# Patient Record
Sex: Male | Born: 1940 | Race: Black or African American | Hispanic: No | Smoking: Never smoker
Health system: Southern US, Community
[De-identification: ages and names within clinical notes are randomized; demographics above are authoritative.]

## PROBLEM LIST (undated history)

## (undated) DIAGNOSIS — F419 Anxiety disorder, unspecified: Secondary | ICD-10-CM

## (undated) DIAGNOSIS — K219 Gastro-esophageal reflux disease without esophagitis: Secondary | ICD-10-CM

## (undated) DIAGNOSIS — I10 Essential (primary) hypertension: Secondary | ICD-10-CM

## (undated) DIAGNOSIS — F79 Unspecified intellectual disabilities: Secondary | ICD-10-CM

## (undated) DIAGNOSIS — G4700 Insomnia, unspecified: Secondary | ICD-10-CM

## (undated) DIAGNOSIS — N189 Chronic kidney disease, unspecified: Secondary | ICD-10-CM

## (undated) HISTORY — DX: Gastro-esophageal reflux disease without esophagitis: K21.9

---

## 2014-10-28 ENCOUNTER — Encounter: Payer: Self-pay | Admitting: Medical Oncology

## 2014-10-28 ENCOUNTER — Emergency Department
Admission: EM | Admit: 2014-10-28 | Discharge: 2014-10-28 | Disposition: A | Discharge status: Home / Self Care | Payer: Medicare Other | Attending: Emergency Medicine | Admitting: Emergency Medicine

## 2014-10-28 DIAGNOSIS — Y846 Urinary catheterization as the cause of abnormal reaction of the patient, or of later complication, without mention of misadventure at the time of the procedure: Secondary | ICD-10-CM | POA: Insufficient documentation

## 2014-10-28 DIAGNOSIS — I10 Essential (primary) hypertension: Secondary | ICD-10-CM | POA: Diagnosis not present

## 2014-10-28 DIAGNOSIS — R339 Retention of urine, unspecified: Secondary | ICD-10-CM

## 2014-10-28 DIAGNOSIS — T839XXA Unspecified complication of genitourinary prosthetic device, implant and graft, initial encounter: Secondary | ICD-10-CM

## 2014-10-28 DIAGNOSIS — T83098A Other mechanical complication of other indwelling urethral catheter, initial encounter: Secondary | ICD-10-CM | POA: Diagnosis not present

## 2014-10-28 HISTORY — DX: Insomnia, unspecified: G47.00

## 2014-10-28 HISTORY — DX: Anxiety disorder, unspecified: F41.9

## 2014-10-28 HISTORY — DX: Essential (primary) hypertension: I10

## 2014-10-28 LAB — URINALYSIS COMPLETE WITH MICROSCOPIC (ARMC ONLY)
Bilirubin Urine: NEGATIVE
Glucose, UA: NEGATIVE mg/dL
Ketones, ur: NEGATIVE mg/dL
Leukocytes, UA: NEGATIVE
Nitrite: NEGATIVE
Protein, ur: NEGATIVE mg/dL
Specific Gravity, Urine: 1.013 (ref 1.005–1.030)
Squamous Epithelial / LPF: NONE SEEN
pH: 8 (ref 5.0–8.0)

## 2014-10-28 MED ORDER — LIDOCAINE HCL 2 % EX GEL
CUTANEOUS | Status: AC
  Filled 2014-10-28: qty 10

## 2014-10-28 MED ORDER — CEPHALEXIN 500 MG PO CAPS
500.0000 mg | ORAL_CAPSULE | Freq: Once | ORAL | Status: AC
  Administered 2014-10-28: 500 mg via ORAL

## 2014-10-28 MED ORDER — CEPHALEXIN 500 MG PO CAPS: 500.0000 mg | ORAL_CAPSULE | Freq: Three times a day (TID) | ORAL | Status: AC

## 2014-10-28 MED ORDER — CEPHALEXIN 500 MG PO CAPS
ORAL_CAPSULE | ORAL | Status: AC
  Filled 2014-10-28: qty 1

## 2014-10-28 NOTE — ED Provider Notes (Signed)
[facility] Emergency Department Provider Note  ____________________________________________  Time seen: Approximately 9:02 PM  I have reviewed the triage vital signs and the nursing notes.   HISTORY  Chief Complaint Urinary Retention    HPI Aaron Stokes is a 74 y.o. male patient reports being unable to urinate since last night prior to that he had some burning with urination. He has no nausea vomiting fever chills or any other complaints except for some lower abdominal pressure which resolves after the Foley is placed and began draining. Patient has not had this problem before.   Past Medical History  Diagnosis Date  . Hypertension   . Anxiety   . Insomnia     There are no active problems to display for this patient.   History reviewed. No pertinent past surgical history.  Current Outpatient Rx  Name  Route  Sig  Dispense  Refill  . cephALEXin (KEFLEX) 500 MG capsule   Oral   Take 1 capsule (500 mg total) by mouth 3 (three) times daily.   30 capsule   0     Allergies Review of patient's allergies indicates no known allergies.  No family history on file.  Social History History  Substance Use Topics  . Smoking status: Never Smoker   . Smokeless tobacco: Not on file  . Alcohol Use: No    Review of Systems Constitutional: No fever/chills Eyes: No visual changes. ENT: No sore throat. Cardiovascular: Denies chest pain. Respiratory: Denies shortness of breath. Musculoskeletal: Negative for back pain. Skin: Negative for rash. Neurological: Negative for headaches, focal weakness or numbness.  10-point ROS otherwise negative.  ____________________________________________   PHYSICAL EXAM:  VITAL SIGNS: ED Triage Vitals  Enc Vitals Group     BP 10/28/14 1758 153/94 mmHg     Pulse Rate 10/28/14 1758 115     Resp 10/28/14 1758 18     Temp 10/28/14 1758 97.2 F (36.2 C)     Temp Source 10/28/14 1758 Oral     SpO2 10/28/14 1758  99 %     Weight 10/28/14 1758 242 lb (109.77 kg)     Height 10/28/14 1758 5\' 8"  (1.727 m)     Head Cir --      Peak Flow --      Pain Score 10/28/14 1759 5     Pain Loc --      Pain Edu? --      Excl. in [place]? --     Constitutional: Alert and oriented. Well appearing and in no acute distress. Eyes: Conjunctivae are normal. PERRL. EOMI. Head: Atraumatic. Nose: No congestion/rhinnorhea. Mouth/Throat: Mucous membranes are moist.  Oropharynx non-erythematous..   Cardiovascular: Normal rate, regular rhythm. Grossly normal heart sounds.  Good peripheral circulation. Respiratory: Normal respiratory effort.  No retractions. Lungs CTAB. Gastrointestinal: Soft and nontender. No distention. No abdominal bruits. No CVA tenderness. Genitourinary: Foley inserted by nurse after 2 other has others had tried. Exam was then done by me. Prostate is soft and nontender. Was Hemoccult-negative Musculoskeletal: No lower extremity tenderness nor edema.  No joint effusions. Neurologic:  Normal speech and language. No gross focal neurologic deficits are appreciated. Speech is normal. No gait instability. Skin:  Skin is warm, dry and intact. No rash noted. Psychiatric: Mood and affect are normal. Speech and behavior are normal.  ____________________________________________   LABS (all labs ordered are listed, but only abnormal results are displayed)  Labs Reviewed  URINALYSIS COMPLETEWITH MICROSCOPIC ([place]) - Abnormal; Notable for the  following:    Color, Urine YELLOW (*)    APPearance CLOUDY (*)    Hgb urine dipstick 2+ (*)    Bacteria, UA RARE (*)    All other components within normal limits   ____________________________________________  EKG  ____________________________________________  RADIOLOGY   ____________________________________________   PROCEDURES   ____________________________________________   INITIAL IMPRESSION / ASSESSMENT AND PLAN / ED COURSE  Pertinent labs &  imaging results that were available during my care of the patient were reviewed by me and considered in my medical decision making (see chart for details). Since the patient had dysuria and the nurse reported the urine also smelled bad we will give him Keflex 500 mg 3 times a day to treat presumed UTI.   ____________________________________________   FINAL CLINICAL IMPRESSION(S) / ED DIAGNOSES  Final diagnoses:  Urinary retention  Foley catheter problem, initial encounter      Nena Polio, MD 10/28/14 2113

## 2014-10-28 NOTE — Discharge Instructions (Signed)
Acute Urinary Retention Acute urinary retention is when you are unable to pee (urinate). Acute urinary retention is common in older men. Prostates can get bigger, which blocks the flow of pee.  HOME CARE  Drink enough fluids to keep your pee clear or pale yellow.  If you are sent home with a tube that drains the bladder (catheter), there will be a drainage bag attached to it. There are two types of bags. One is big that you can wear at night without having to empty it. One is smaller and needs to be emptied more often.  Keep the drainage bag empty.  Keep the drainage bag lower than your catheter.  Only take medicine as told by your doctor. GET HELP IF:  You have a low-grade fever.  You have spasms or you are leaking pee when you have spasms. GET HELP RIGHT AWAY IF:   You have chills or a fever.  Your catheter stops draining pee.  Your catheter falls out.  You have increased bleeding that does not stop after you have rested and increased the amount of fluids you had been drinking. MAKE SURE YOU:   Understand these instructions.  Will watch your condition.  Will get help right away if you are not doing well or get worse. Document Released: 09/23/2007 Document Revised: 01/25/2013 Document Reviewed: 09/15/2012 [employer] Patient Information 2015 [place], [place]. This information is not intended to replace advice given to you by your health care provider. Make sure you discuss any questions you have with your health care provider.   Please call the urologist to set up a follow up appointment later this week. Wear the leg bag during the day. Return for any fever, vomiting or bleeding. Or feeling sicker.

## 2014-10-28 NOTE — ED Notes (Signed)
Pt to ER via ems with reports of lower abd pressure and unable to void since last night. When pt urinates he reports burning.

## 2014-11-23 ENCOUNTER — Ambulatory Visit: Payer: Self-pay | Admitting: Urology

## 2014-11-26 ENCOUNTER — Encounter: Payer: Self-pay | Admitting: Urology

## 2014-11-26 ENCOUNTER — Ambulatory Visit (INDEPENDENT_AMBULATORY_CARE_PROVIDER_SITE_OTHER): Payer: Medicare Other | Admitting: Urology

## 2014-11-26 VITALS — BP 125/76 | HR 111 | Ht 68.0 in | Wt 221.8 lb

## 2014-11-26 DIAGNOSIS — G4700 Insomnia, unspecified: Secondary | ICD-10-CM | POA: Insufficient documentation

## 2014-11-26 DIAGNOSIS — F419 Anxiety disorder, unspecified: Secondary | ICD-10-CM | POA: Insufficient documentation

## 2014-11-26 DIAGNOSIS — I10 Essential (primary) hypertension: Secondary | ICD-10-CM | POA: Insufficient documentation

## 2014-11-26 DIAGNOSIS — N471 Phimosis: Secondary | ICD-10-CM

## 2014-11-26 DIAGNOSIS — R338 Other retention of urine: Secondary | ICD-10-CM | POA: Diagnosis not present

## 2014-11-26 LAB — BLADDER SCAN AMB NON-IMAGING: Scan Result: 143

## 2014-11-26 NOTE — Progress Notes (Signed)
11/26/2014 8:18 AM   Aaron Stokes Sep 19, 1940 [id number]  Referring provider: Ricke Hey, MD Aaron Stokes, Aaron Stokes [id number]  Chief Complaint  Patient presents with  . Urinary Retention    pt was seen [facility] for urinary retention x 10/28/14    HPI: 74 year old male who presents today for follow-up after episode of urinary retention seen in the ER on 10/28/2014.  He reports that on the evening prior to his presentation to the ER, he developed difficulty urinating and burning with urination.  He was diagnosed with a urinary tract infection and treated with Keflex. His UA was mildy suspicious for infection, RBC TNTC, 0-5 WBC with WBC clumps.  No urine culture was sent.  A Foley was placed which remains in place today.    No history of UTI or urinary retention.    Prior to this episode, he denies any voiding symptoms including hematuria, dysuria, urinary frequency or urgency.  He did get up approximately once per night to urinate.  He takes no BPH meds and has no previous diagnosis of BPH.  He is not circumcised and sometimes has difficulty pulling back is foreskin.    No family history of prostate cancer.       PMH: Past Medical History  Diagnosis Date  . Hypertension   . Anxiety   . Insomnia   . Acid reflux     Surgical History: Past Surgical History  Procedure Laterality Date  . No past surgeries      Home Medications:    Medication List       This list is accurate as of: 11/26/14 11:59 PM.  Always use your most recent med list.               amLODipine 10 MG tablet  Commonly known as:  NORVASC     atenolol 50 MG tablet  Commonly known as:  TENORMIN  Take 50 mg by mouth daily.     flurazepam 30 MG capsule  Commonly known as:  DALMANE  take 1 capsule by mouth at bedtime if needed     perphenazine-amitriptyline 4-25 MG Tabs  Commonly known as:  ETRAFON/TRIAVIL        Allergies: No Known Allergies  Family History: Family History    Problem Relation Age of Onset  . Prostate cancer Neg Hx   . Bladder Cancer Father   . Bladder Cancer Paternal Grandmother     Social History:  reports that he has never smoked. He does not have any smokeless tobacco history on file. He reports that he does not drink alcohol or use illicit drugs.  ROS: UROLOGY Frequent Urination?: Yes Hard to postpone urination?: No Burning/pain with urination?: No Get up at night to urinate?: No Leakage of urine?: No Urine stream starts and stops?: No Trouble starting stream?: Yes Do you have to strain to urinate?: No Blood in urine?: No Urinary tract infection?: No Sexually transmitted disease?: No Injury to kidneys or bladder?: No Painful intercourse?: No Weak stream?: No Erection problems?: No Penile pain?: No  Gastrointestinal Nausea?: No Vomiting?: No Indigestion/heartburn?: No Diarrhea?: No Constipation?: No  Constitutional Fever: No Night sweats?: No Weight loss?: No Fatigue?: No  Skin Skin rash/lesions?: No Itching?: No  Eyes Blurred vision?: No Double vision?: No  Ears/Nose/Throat Sore throat?: No Sinus problems?: No  Hematologic/Lymphatic Swollen glands?: No Easy bruising?: No  Cardiovascular Leg swelling?: No Chest pain?: No  Respiratory Cough?: No Shortness of breath?: No  Endocrine Excessive  thirst?: No  Musculoskeletal Back pain?: No Joint pain?: No  Neurological Headaches?: No Dizziness?: No  Psychologic Depression?: No Anxiety?: No  Physical Exam: BP 125/76 mmHg  Pulse 111  Ht 5\' 8"  (1.727 m)  Wt 221 lb 12.8 oz (100.608 kg)  BMI 33.73 kg/m2  Constitutional:  Alert and oriented, No acute distress. HEENT: [place] AT, moist mucus membranes.  Trachea midline, no masses. Cardiovascular: No clubbing, cyanosis, or edema. Respiratory: Normal respiratory effort, no increased work of breathing. GI: Abdomen is soft, nontender, nondistended, no abdominal masses GU: No CVA tenderness.  Uncircumcised phallus with difficult to retract foreskin, Foley catheter in place, somewhat dirty appearing.  Rectal exam today performed, limited due to increased sphincter tone and patient compliance, able to palpate apex of prostate which was relatively normal. Skin: No rashes, bruises or suspicious lesions.  Scrotum unremarkable, and no masses, bilateral descended testicles. Lymph: No cervical or inguinal adenopathy. Neurologic: Grossly intact, no focal deficits, moving all 4 extremities. Psychiatric: Normal mood and affect.  Labs: Component     Latest Ref Rng 10/28/2014  Color, Urine     YELLOW YELLOW (A)  Appearance     CLEAR CLOUDY (A)  Glucose     NEGATIVE mg/dL NEGATIVE  Bilirubin Urine     NEGATIVE NEGATIVE  Ketones, ur     NEGATIVE mg/dL NEGATIVE  Specific Gravity, Urine     1.005 - 1.030 1.013  Hgb urine dipstick     NEGATIVE 2+ (A)  pH     5.0 - 8.0 8.0  Protein     NEGATIVE mg/dL NEGATIVE  Nitrite     NEGATIVE NEGATIVE  Leukocytes, UA     NEGATIVE NEGATIVE  RBC / HPF     0 - 5 RBC/hpf TOO NUMEROUS TO COUNT  WBC, UA     0 - 5 WBC/hpf 0-5  Bacteria, UA     NONE SEEN RARE (A)  Squamous Epithelial / LPF     NONE SEEN NONE SEEN  WBC Clumps      PRESENT  Mucous      PRESENT  Hyaline Casts, UA      PRESENT  Amorphous Crystal      PRESENT   Pertinent Imaging: Results for orders placed or performed in visit on 11/26/14  Bladder Scan (Post Void Residual) in office  Result Value Ref Range   Scan Result 143      Assessment & Plan:  74 year old male with acute onset urinary retention status post Foley catheter placement over a month ago in the ER. Foley catheter remains in place today. Etiology of retention unclear, may be related to infection although no culture sent, treated prophylactically. Voiding trial performed today and patient was able to void a fairly decent volume was slightly elevated postvoid residual.  1. Acute urinary retention Voiding trial  today, plan to have patient follow up in 6 weeks for postvoid residual. He was instructed to return back to clinic or call if he has any difficulty voiding whatsoever.  Plan to have the patient follow-up in proximally 6 weeks to reassess symptoms and postvoid residual. - Bladder Scan (Post Void Residual) in office  2. Phimosis Difficulty retracting the foreskin, we'll discuss possibility of circ in future at next visit   Return in about 6 weeks (around 01/07/2015) for PVR.  Hollice Espy, MD  [facility] [address] [place],  [address] [phone]

## 2014-11-26 NOTE — Progress Notes (Signed)
Fill and Pull Catheter Removal  Patient is present today for a catheter removal.  Patient was cleaned and prepped in a sterile fashion 200 ml of sterile water/ saline was instilled into the bladder when the patient felt the urge to urinate. 6 ml of water was then drained from the balloon.  A  16 FR foley cath was removed from the bladder pt had a lot of bladder spasms.  Patient as then given some time to void on their own.  Patient can void  150 ml on his own after some time.  Patient tolerated well.  Bladder Scan after  Patient void: 143 ml Performed By: KR  Follow up: scheduled for 1 mth

## 2014-11-27 ENCOUNTER — Encounter: Payer: Self-pay | Admitting: Urology

## 2015-01-07 ENCOUNTER — Ambulatory Visit (INDEPENDENT_AMBULATORY_CARE_PROVIDER_SITE_OTHER): Payer: Medicare Other | Admitting: Obstetrics and Gynecology

## 2015-01-07 ENCOUNTER — Telehealth: Payer: Self-pay | Admitting: Obstetrics and Gynecology

## 2015-01-07 ENCOUNTER — Ambulatory Visit: Payer: Medicare Other | Admitting: Obstetrics and Gynecology

## 2015-01-07 ENCOUNTER — Other Ambulatory Visit: Payer: Self-pay | Admitting: *Deleted

## 2015-01-07 ENCOUNTER — Ambulatory Visit: Payer: Medicare Other

## 2015-01-07 ENCOUNTER — Encounter: Payer: Self-pay | Admitting: Obstetrics and Gynecology

## 2015-01-07 VITALS — BP 139/86 | HR 112 | Resp 18 | Ht 68.0 in | Wt 218.9 lb

## 2015-01-07 DIAGNOSIS — N481 Balanitis: Secondary | ICD-10-CM

## 2015-01-07 DIAGNOSIS — R339 Retention of urine, unspecified: Secondary | ICD-10-CM | POA: Diagnosis not present

## 2015-01-07 LAB — URINALYSIS, COMPLETE
Bilirubin, UA: POSITIVE — AB
Glucose, UA: NEGATIVE
Nitrite, UA: NEGATIVE
Specific Gravity, UA: >1.03 — ABNORMAL HIGH (ref 1.005–1.030)
Urobilinogen, Ur: 1 mg/dL (ref 0.2–1.0)
pH, UA: 5.5 (ref 5.0–7.5)

## 2015-01-07 LAB — BLADDER SCAN AMB NON-IMAGING

## 2015-01-07 LAB — MICROSCOPIC EXAMINATION: WBC, UA: >30 /hpf — ABNORMAL HIGH (ref 0–?)

## 2015-01-07 MED ORDER — NYSTATIN-TRIAMCINOLONE 100000-0.1 UNIT/GM-% EX OINT: 1.0000 "application " | TOPICAL_OINTMENT | Freq: Two times a day (BID) | CUTANEOUS | Status: DC

## 2015-01-07 NOTE — Telephone Encounter (Signed)
Spoke with pt niece, Kenney Houseman, in reference to a medication being called in. Made Tonya aware medication was sent to pt pharmacy again. Tonya voiced understanding.

## 2015-01-07 NOTE — Telephone Encounter (Signed)
Pt's sister called and was asking about a rx for bladder infection.  They're not sure if it was given to pt or sent electronically.  Was a rx given?

## 2015-01-07 NOTE — Progress Notes (Signed)
0000000  01/07/2015 12:38 PM   Aaron Stokes 1940-12-19 [id number]  Referring provider: Ricke Hey, MD [place], [place] [address]  Chief Complaint  Patient presents with  . Urinary Retention    HPI: Patient is a 74 year old male with a history of acute urinary retention on 10/28/14 when he presented to the emergency department and Foley catheter was placed at that time. He was also treated for urinary tract infection but no urine culture was sent. Microscopic hematuria was noted at that time. He later presented to our office for a voiding trial which was successful. He currently denies any urinary symptoms.   He presents today to recheck PVR.  No history of UTI or urinary retention.   Prior to this episode, he denies any voiding symptoms including hematuria, dysuria, urinary frequency or urgency. He did get up approximately once per night to urinate. He takes no BPH meds and has no previous diagnosis of BPH.  He is not circumcised and sometimes has difficulty pulling back is foreskin.   No family history of prostate cancer.Patient reports prostate cancer screening performed by his primary care doctor in [place].   PMH: Past Medical History  Diagnosis Date  . Hypertension   . Anxiety   . Insomnia   . Acid reflux     Surgical History: Past Surgical History  Procedure Laterality Date  . No past surgeries      Home Medications:    Medication List       This list is accurate as of: 01/07/15 12:38 PM.  Always use your most recent med list.               amLODipine 10 MG tablet  Commonly known as:  NORVASC     atenolol 50 MG tablet  Commonly known as:  TENORMIN  Take 50 mg by mouth daily.     flurazepam 30 MG capsule  Commonly known as:  DALMANE  take 1 capsule by mouth at bedtime if needed     perphenazine-amitriptyline 4-25 MG Tabs  Commonly known as:  ETRAFON/TRIAVIL        Allergies: No Known Allergies  Family  History: Family History  Problem Relation Age of Onset  . Prostate cancer Neg Hx   . Bladder Cancer Father   . Bladder Cancer Paternal Grandmother     Social History:  reports that he has never smoked. He does not have any smokeless tobacco history on file. He reports that he does not drink alcohol or use illicit drugs.  ROS: UROLOGY Frequent Urination?: No Hard to postpone urination?: Yes Burning/pain with urination?: No Get up at night to urinate?: No Leakage of urine?: No Urine stream starts and stops?: No Trouble starting stream?: No Do you have to strain to urinate?: No Blood in urine?: No Urinary tract infection?: No Sexually transmitted disease?: No Injury to kidneys or bladder?: No Painful intercourse?: No Weak stream?: No Erection problems?: No Penile pain?: No  Gastrointestinal Nausea?: No Vomiting?: No Indigestion/heartburn?: No Diarrhea?: No Constipation?: No  Constitutional Fever: No Night sweats?: No Weight loss?: No Fatigue?: No  Skin Skin rash/lesions?: No Itching?: No  Eyes Blurred vision?: Yes Double vision?: No  Ears/Nose/Throat Sore throat?: No Sinus problems?: No  Hematologic/Lymphatic Swollen glands?: No Easy bruising?: No  Cardiovascular Leg swelling?: No Chest pain?: No  Respiratory Cough?: No Shortness of breath?: No  Endocrine Excessive thirst?: Yes  Musculoskeletal Back pain?: No Joint pain?: No  Neurological Headaches?: No Dizziness?: No  Psychologic Depression?: No Anxiety?: No  Physical Exam: BP 139/86 mmHg  Pulse 112  Resp 18  Ht 5\' 8"  (1.727 m)  Wt 218 lb 14.4 oz (99.292 kg)  BMI 33.29 kg/m2  Constitutional:  Alert and oriented, No acute distress. HEENT: [place] AT, moist mucus membranes.  Trachea midline, no masses. Cardiovascular: No clubbing, cyanosis, or edema. Respiratory: Normal respiratory effort, no increased work of breathing. GI: Abdomen is soft, nontender, nondistended, no abdominal  masses GU: No CVA tenderness. Adhesions along coronal border, tender with small amount of inflammation  Skin: No rashes, bruises or suspicious lesions. Lymph: No cervical or inguinal adenopathy. Neurologic: Grossly intact, no focal deficits, moving all 4 extremities. Psychiatric: Normal mood and affect.  Laboratory Data: No results found for: WBC, HGB, HCT, MCV, PLT  No results found for: CREATININE  No results found for: PSA  No results found for: TESTOSTERONE  No results found for: HGBA1C  Urinalysis    Component Value Date/Time   COLORURINE YELLOW* 10/28/2014 1802   APPEARANCEUR CLOUDY* 10/28/2014 1802   LABSPEC 1.013 10/28/2014 1802   PHURINE 8.0 10/28/2014 1802   GLUCOSEU NEGATIVE 10/28/2014 1802   HGBUR 2+* 10/28/2014 1802   BILIRUBINUR NEGATIVE 10/28/2014 1802   KETONESUR NEGATIVE 10/28/2014 1802   PROTEINUR NEGATIVE 10/28/2014 1802   NITRITE NEGATIVE 10/28/2014 1802   LEUKOCYTESUR NEGATIVE 10/28/2014 1802    Pertinent Imaging:  Assessment & Plan:    1. Urinary retention-  PVR improved since last visit and minimal today. Patient denies any urinary symptoms.  UA remarkable for WBCs, RBCs and bacteria but patient states that he did not retract his foreskin prior to providing specimen. We will recheck patient's urinalysis at his follow-up visit to recheck microscopic hematuria. He was instructed on proper clean-catch technique today. - Urinalysis, Complete - BLADDER SCAN AMB NON-IMAGING Results for Aaron Stokes, Aaron Stokes (MRN [id number]) as of 01/07/2015 11:41  Ref. Range 11/26/2014 12:28 01/07/2015 11:22  Scan Result Unknown 143 71ml   2. Phimosis/Balanitis- Foreskin able to be retracted quite easily today but small adhesions and irritation noted to coronal border. Mycolog cream prescribed today and we will recheck in 4 weeks. Proper penile hygiene reviewed with patient.  3. Microscopic hematuria- prior microscopic hematuria noted on urinalysis done in the emergency department  but but no urine culture was sent and it is unclear whether this was in the setting of an urinary tract infection. 3-10 RBCs seen on today's micro-urinalysis. Patient admits to not pulling back his foreskin prior to voiding which likely contaminated his urine specimen. He states he cannot provide another specimen at this time. We will recheck urinalysis at next visit.  Return in about 4 weeks (around 02/04/2015) for recheck balanitis/phimosis/recheck UA.  Herbert Moors, [facility] [address] [place], [place] [address] [phone]

## 2015-02-04 ENCOUNTER — Encounter: Payer: Self-pay | Admitting: Obstetrics and Gynecology

## 2015-02-04 ENCOUNTER — Ambulatory Visit: Payer: Medicare Other | Admitting: Obstetrics and Gynecology

## 2015-02-08 ENCOUNTER — Telehealth: Payer: Self-pay | Admitting: Obstetrics and Gynecology

## 2015-02-08 ENCOUNTER — Ambulatory Visit (INDEPENDENT_AMBULATORY_CARE_PROVIDER_SITE_OTHER): Payer: Medicare Other | Admitting: Obstetrics and Gynecology

## 2015-02-08 ENCOUNTER — Encounter: Payer: Self-pay | Admitting: Obstetrics and Gynecology

## 2015-02-08 VITALS — BP 143/80 | HR 91 | Wt 212.0 lb

## 2015-02-08 DIAGNOSIS — N481 Balanitis: Secondary | ICD-10-CM | POA: Diagnosis not present

## 2015-02-08 DIAGNOSIS — R3129 Other microscopic hematuria: Secondary | ICD-10-CM | POA: Diagnosis not present

## 2015-02-08 LAB — URINALYSIS
Bilirubin, UA: NEGATIVE
Glucose, UA: NEGATIVE
Nitrite, UA: POSITIVE — AB
Specific Gravity, UA: >1.03 — ABNORMAL HIGH (ref 1.005–1.030)
Urobilinogen, Ur: 1 mg/dL (ref 0.2–1.0)
pH, UA: 7 (ref 5.0–7.5)

## 2015-02-08 MED ORDER — CEPHALEXIN 500 MG PO CAPS: 500.0000 mg | ORAL_CAPSULE | Freq: Two times a day (BID) | ORAL | Status: DC

## 2015-02-08 NOTE — Telephone Encounter (Signed)
Please notify the patient's wife that his urinalysis was suspicious for a urinary tract infection so I sent an antibiotic to his pharmacy. The inflammation on his penis has completely resolved. He needs to follow up in 3 weeks as scheduled to recheck his urine.

## 2015-02-08 NOTE — Progress Notes (Signed)
02/08/2015 4:25 PM   Aaron Stokes 1940-08-10 [id number]  Referring provider: Ricke Hey, MD [place], [place] [address]  Chief Complaint  Patient presents with  . Follow-up    balanitis and phimosis. Pt reports that he is not having any pain or discomfort.    HPI: Patient is a 74yo male with a history of urinary retention presenting today for follow up balanitis, phimosis and microscopic hematuria.  He reports that he used the Mycolog cream as instructed and penile irritation has resolved. He denies any urinary symptoms.  Microscopic hematuria noted on previous UAs but it was unclear if they were in the setting of infection.  We will recheck today.   PMH: Past Medical History  Diagnosis Date  . Hypertension   . Anxiety   . Insomnia   . Acid reflux     Surgical History: Past Surgical History  Procedure Laterality Date  . No past surgeries      Home Medications:    Medication List       This list is accurate as of: 02/08/15  4:25 PM.  Always use your most recent med list.               amLODipine 10 MG tablet  Commonly known as:  NORVASC     atenolol 50 MG tablet  Commonly known as:  TENORMIN  Take 50 mg by mouth daily.     cephALEXin 500 MG capsule  Commonly known as:  KEFLEX  Take 1 capsule (500 mg total) by mouth 2 (two) times daily.     flurazepam 30 MG capsule  Commonly known as:  DALMANE  take 1 capsule by mouth at bedtime if needed     nystatin-triamcinolone ointment  Commonly known as:  MYCOLOG  Apply 1 application topically 2 (two) times daily. Pull back foreskin and apply to head of penis     perphenazine-amitriptyline 4-25 MG Tabs tablet  Commonly known as:  ETRAFON/TRIAVIL        Allergies: No Known Allergies  Family History: Family History  Problem Relation Age of Onset  . Prostate cancer Neg Hx   . Bladder Cancer Father   . Bladder Cancer Paternal Grandmother     Social History:  reports that he has never  smoked. He does not have any smokeless tobacco history on file. He reports that he does not drink alcohol or use illicit drugs.  ROS: UROLOGY Frequent Urination?: No Hard to postpone urination?: No Burning/pain with urination?: No Get up at night to urinate?: No Leakage of urine?: No Urine stream starts and stops?: No Trouble starting stream?: No Do you have to strain to urinate?: No Blood in urine?: No Urinary tract infection?: No Sexually transmitted disease?: No Injury to kidneys or bladder?: No Painful intercourse?: No Weak stream?: Yes Erection problems?: No Penile pain?: No  Gastrointestinal Nausea?: No Vomiting?: No Indigestion/heartburn?: No Diarrhea?: No Constipation?: No  Constitutional Fever: No Night sweats?: No Weight loss?: No Fatigue?: Yes  Skin Skin rash/lesions?: No Itching?: No  Eyes Blurred vision?: Yes Double vision?: No  Ears/Nose/Throat Sore throat?: No Sinus problems?: No  Hematologic/Lymphatic Swollen glands?: No Easy bruising?: No  Cardiovascular Leg swelling?: No Chest pain?: No  Respiratory Cough?: No Shortness of breath?: No  Endocrine Excessive thirst?: No  Musculoskeletal Back pain?: No Joint pain?: No  Neurological Headaches?: No Dizziness?: No  Psychologic Depression?: No Anxiety?: No  Physical Exam: BP 143/80 mmHg  Pulse 91  Wt 212 lb (  96.163 kg)  Constitutional:  Alert and oriented, No acute distress. HEENT: [place] AT, moist mucus membranes.  Trachea midline, no masses. Cardiovascular: No clubbing, cyanosis, or edema. Respiratory: Normal respiratory effort, no increased work of breathing. GU: uncircumcised phallus, easily retractable foreskin, no balanitis Skin: No rashes, bruises or suspicious lesions. Lymph: No cervical or inguinal adenopathy. Neurologic: Grossly intact, no focal deficits, moving all 4 extremities. Psychiatric: Normal mood and affect.  Laboratory Data:   Urinalysis  Pertinent  Imaging:   Assessment & Plan:    1. Microscopic hematuria- UA suspicious for infection today.  I will send in a prescription for Keflex an adjust pending culture results. May need catheterized specimen at f/u visit. - Urinalysis  2. Balanitis/Phimosis- S/p treatement with Mycolog cream. Resolved.   Return in about 3 weeks (around 03/01/2015).  These notes generated with voice recognition software. I apologize for typographical errors.  Herbert Moors, [facility] [address], [place], [place] [address] [phone]

## 2015-02-08 NOTE — Telephone Encounter (Signed)
Attempted to call the pt and the number was disconnected.

## 2015-02-08 NOTE — Telephone Encounter (Signed)
Pt was seen this morning.  Wife & niece are confused regarding his treatment and plan of care.  Pt cannot explain to them what he was told this morning.  They are asking that you call them back & talk with them about the plan.   Phone # is [phone].

## 2015-02-10 LAB — CULTURE, URINE COMPREHENSIVE

## 2015-02-11 ENCOUNTER — Telehealth: Payer: Self-pay

## 2015-02-11 NOTE — Telephone Encounter (Signed)
Spoke with Jenny Reichmann, pt brother-in law, in reference to abx and +ucx. [name] stated he would make pt aware and have pt call [facility].

## 2015-02-11 NOTE — Telephone Encounter (Signed)
-----   Message from Roda Shutters, [place] sent at 02/11/2015  1:25 PM EDT ----- Please notify patient and his wife that his urine culture was positive for infection.  The prescription I sent in should resolve the infection but I would like to come in 2 weeks for a CATHETERIZED specimen for UA and culture to confirm resolution of infection and microscopic hematuria.  Thanks

## 2015-02-11 NOTE — Telephone Encounter (Signed)
Invalid number. Will send a letter.

## 2015-03-01 ENCOUNTER — Encounter: Payer: Self-pay | Admitting: Obstetrics and Gynecology

## 2015-03-01 ENCOUNTER — Ambulatory Visit: Payer: Medicare Other | Admitting: Obstetrics and Gynecology

## 2015-03-08 ENCOUNTER — Ambulatory Visit (INDEPENDENT_AMBULATORY_CARE_PROVIDER_SITE_OTHER): Payer: Medicare Other | Admitting: Obstetrics and Gynecology

## 2015-03-08 ENCOUNTER — Encounter: Payer: Self-pay | Admitting: Obstetrics and Gynecology

## 2015-03-08 VITALS — BP 135/78 | HR 56 | Ht 68.0 in | Wt 207.3 lb

## 2015-03-08 DIAGNOSIS — R3129 Other microscopic hematuria: Secondary | ICD-10-CM | POA: Diagnosis not present

## 2015-03-08 DIAGNOSIS — N390 Urinary tract infection, site not specified: Secondary | ICD-10-CM | POA: Diagnosis not present

## 2015-03-08 DIAGNOSIS — N471 Phimosis: Secondary | ICD-10-CM

## 2015-03-08 LAB — URINALYSIS, COMPLETE
Bilirubin, UA: NEGATIVE
Glucose, UA: NEGATIVE
Ketones, UA: NEGATIVE
Nitrite, UA: POSITIVE — AB
Specific Gravity, UA: >1.03 — ABNORMAL HIGH (ref 1.005–1.030)
Urobilinogen, Ur: 2 mg/dL — ABNORMAL HIGH (ref 0.2–1.0)
pH, UA: 5.5 (ref 5.0–7.5)

## 2015-03-08 LAB — MICROSCOPIC EXAMINATION: WBC, UA: >30 /hpf — ABNORMAL HIGH (ref 0–?)

## 2015-03-08 NOTE — Patient Instructions (Signed)
Balanitis Balanitis is inflammation of the head of the penis (glans).  CAUSES  Balanitis has multiple causes, both infectious and noninfectious. Often balanitis is the result of poor personal hygiene, especially in uncircumcised males. Without adequate washing, viruses, bacteria, and yeast collect between the foreskin and the glans. This can cause an infection. Lack of air and irritation from a normal secretion called smegma contribute to the cause in uncircumcised males. Other causes include:  Chemical irritation from the use of certain soaps and shower gels (especially soaps with perfumes), condoms, personal lubricants, petroleum jelly, spermicides, and fabric conditioners.  Skin conditions, such as eczema, dermatitis, and psoriasis.  Allergies to drugs, such as tetracycline and sulfa.  Certain medical conditions, including liver cirrhosis, congestive heart failure, and kidney disease.  Morbid obesity. RISK FACTORS  Diabetes mellitus.  A tight foreskin that is difficult to pull back past the glans (phimosis).  Sex without the use of a condom. SIGNS AND SYMPTOMS  Symptoms may include:  Discharge coming from under the foreskin.  Tenderness.  Itching and inability to get an erection (because of the pain).  Redness and a rash.  Sores on the glans and on the foreskin. DIAGNOSIS Diagnosis of balanitis is confirmed through a physical exam. TREATMENT The treatment is based on the cause of the balanitis. Treatment may include:  Frequent cleansing.  Keeping the glans and foreskin dry.  Use of medicines such as creams, pain medicines, antibiotics, or medicines to treat fungal infections.  Sitz baths. If the irritation has caused a scar on the foreskin that prevents easy retraction, a circumcision may be recommended.  HOME CARE INSTRUCTIONS  Sex should be avoided until the condition has cleared. MAKE SURE YOU:  Understand these instructions.  Will watch your  condition.  Will get help right away if you are not doing well or get worse.   This information is not intended to replace advice given to you by your health care provider. Make sure you discuss any questions you have with your health care provider.   Document Released: 08/23/2008 Document Revised: 04/11/2013 Document Reviewed: 09/26/2012 Elsevier Interactive Patient Education 2016 [employer]. Circumcision, Adult, Care After These instructions give you information on caring for yourself after your procedure. Your doctor may also give you more specific instructions. Call your doctor if you have any problems or questions after your procedure. HOME CARE  Only take medicine as told by your doctor.  Any bandages (dressings) should stay on for at least 24 hours.  You may take the bandages off at night to let air get to the area where your doctor made a cut (incision site). Once a scab forms over the cut, you will not need to use bandages.  Carefully remove the bandage if it gets dirty. Apply medicated cream to the cut. Carefully put a new bandage on if a scab has not formed.  Do not have sex until your doctor says it is okay.  Do not get your cut wet for 24 hours or as told by your doctor.  You may take a sponge bath. Clean around the cut gently with mild soap and water.  You may take a shower after 24 hours or as told by your doctor. Do not take a tub bath. After you shower, gently pat your cut dry. Do not rub it.  Avoid heavy lifting.  Avoid contact sports, biking, or swimming until you have healed. This usually takes 10-14 days. GET HELP IF:  You have pain that does not  go away after you take medicine for it.  You have puffiness (swelling) or redness that is unexpected.  You have a fever. GET HELP RIGHT AWAY IF:   You cannot pee (urinate).  You have pain when you pee.  Your pain is not helped by medicines.  There is redness, puffiness, and soreness spreading up the shaft  of your penis, your thighs, or your lower belly (abdomen).  There is yellowish-white fluid (pus) coming from your cut.  You have bleeding that does not stop when you press on it. MAKE SURE YOU:  Understand these instructions.  Will watch your condition.  Will get help right away if you are not doing well or get worse.   This information is not intended to replace advice given to you by your health care provider. Make sure you discuss any questions you have with your health care provider.   Document Released: 09/23/2007 Document Revised: 04/27/2014 Document Reviewed: 12/01/2012 Elsevier Interactive Patient Education [employer]. Circumcision, Adult Circumcision is a surgery to remove the foreskin of the penis or to cut the foreskin so the opening is larger. When only the foreskin is cut, it is called a dorsal (on top of the foreskin) incision. This procedure leaves the entire foreskin but makes the end of the foreskin looser so it can be pulled back over the head of the penis.  LET [facility] CARE PROVIDER KNOW ABOUT:  Any allergies you have.  All medicines you are taking, including vitamins, herbs, eye drops, creams, and over the counter medicines.  Previous problems you or members of your family have had with the use of anesthetics.  Any blood disorders you have.  Previous surgeries you have had.  Medical conditions you have. RISKS AND COMPLICATIONS  Generally, circumcision is a safe procedure. However, as with any procedure, problems can occur. Possible problems include:  Bleeding.  Infection.  Pain.  Urethral injury. The urethra is the opening on the end of the penis that carries your urine out.  Breaking open of the surgical wound can occur from an unwanted erection after surgery. BEFORE THE PROCEDURE   Do not take aspirin or blood thinners for a week prior to surgery, or as your health care provider suggests.  Do not eat or drink anything after midnight  the night before surgery, or as instructed.  Let your health care provider know if you develop a cold or other infection before surgery.  You should be present 1 hour before the procedure, or as directed.  Before the procedure, you will be washed and given a local anesthetic to make sure you feel no pain. PROCEDURE  An anesthetic will be given. When an anesthetic that just numbs the area of the procedure (local anesthetic) is used, the anesthetic will be injected with a needle into the skin of your penis to numb the nerves of your foreskin. Your surgeon will remove the excess foreskin with a surgical knife. Absorbable sutures will be used to close the incision after the foreskin has been removed. The sutures will not need to be removed.  AFTER THE PROCEDURE A sterile dressing will be applied to the incision site. It may be difficult to keep the dressing in place. It is alright if the dressing comes off, especially at night. Air will help a scab to form which will eliminate the need for dressings during the day.   This information is not intended to replace advice given to you by your health care provider. Make  sure you discuss any questions you have with your health care provider.   Document Released: 04/26/2007 Document Revised: 04/27/2014 Document Reviewed: 09/06/2012 Elsevier Interactive Patient Education [employer].

## 2015-03-08 NOTE — Progress Notes (Signed)
03/08/2015 2:11 PM   Aaron Stokes 05-08-1940 [id number]  Referring provider: Ricke Hey, MD [place], [place] [address]  Chief Complaint  Patient presents with  . Follow-up    phimosis    HPI:  patient is 74 year old male with a history of urinary retention, recurrent urinary tract infections and phimosis/balanitis.  Currently he denies any urinary symptoms including dysuria , frequency or urgency. He is experience no gross hematuria. No fevers or flank pain. He does seem to have some memory issues.  His balanitis has previously been treated with Mycolog cream and resolved. He denies any difficulty pulling back is foreskin.  PMH: Past Medical History  Diagnosis Date  . Hypertension   . Anxiety   . Insomnia   . Acid reflux     Surgical History: Past Surgical History  Procedure Laterality Date  . No past surgeries      Home Medications:    Medication List       This list is accurate as of: 03/08/15  2:11 PM.  Always use your most recent med list.               amLODipine 10 MG tablet  Commonly known as:  NORVASC     atenolol 50 MG tablet  Commonly known as:  TENORMIN  Take 50 mg by mouth daily.     flurazepam 30 MG capsule  Commonly known as:  DALMANE  take 1 capsule by mouth at bedtime if needed     nystatin-triamcinolone ointment  Commonly known as:  MYCOLOG  Apply 1 application topically 2 (two) times daily. Pull back foreskin and apply to head of penis     perphenazine-amitriptyline 4-25 MG Tabs tablet  Commonly known as:  ETRAFON/TRIAVIL        Allergies: No Known Allergies  Family History: Family History  Problem Relation Age of Onset  . Prostate cancer Neg Hx   . Bladder Cancer Father   . Bladder Cancer Paternal Grandmother     Social History:  reports that he has never smoked. He does not have any smokeless tobacco history on file. He reports that he does not drink alcohol or use illicit  drugs.  ROS: UROLOGY Frequent Urination?: No Hard to postpone urination?: No Burning/pain with urination?: No Get up at night to urinate?: No Leakage of urine?: No Urine stream starts and stops?: No Trouble starting stream?: No Do you have to strain to urinate?: No Blood in urine?: No Urinary tract infection?: No Sexually transmitted disease?: No Injury to kidneys or bladder?: No Painful intercourse?: No Weak stream?: No Erection problems?: No Penile pain?: No  Gastrointestinal Nausea?: No Vomiting?: No Indigestion/heartburn?: No Diarrhea?: No Constipation?: No  Constitutional Fever: No Night sweats?: No Weight loss?: No Fatigue?: No  Skin Skin rash/lesions?: No Itching?: No  Eyes Blurred vision?: No Double vision?: No  Ears/Nose/Throat Sore throat?: No Sinus problems?: No  Hematologic/Lymphatic Swollen glands?: No Easy bruising?: No  Cardiovascular Leg swelling?: No Chest pain?: No  Respiratory Cough?: No Shortness of breath?: No  Endocrine Excessive thirst?: No  Musculoskeletal Back pain?: No Joint pain?: No  Neurological Headaches?: No Dizziness?: No  Psychologic Depression?: No Anxiety?: No  Physical Exam: BP 135/78 mmHg  Pulse 56  Ht 5\' 8"  (1.727 m)  Wt 207 lb 4.8 oz (94.031 kg)  BMI 31.53 kg/m2  Constitutional:  Alert and oriented, No acute distress. HEENT: [place] AT, moist mucus membranes.  Trachea midline, no masses. GU:  Uncircumcised phallus.  Foreskin is easily retractable but it does not appear that patient has been performing good hygiene practices, glans moist with urine, some superficial skin breakdown on glans Skin: No rashes, bruises or suspicious lesions. Lymph: No cervical or inguinal adenopathy. Neurologic: Grossly intact, no focal deficits, moving all 4 extremities. Psychiatric: Normal mood and affect.  Laboratory Data:   Urinalysis    Component Value Date/Time   COLORURINE YELLOW* 10/28/2014 1802    APPEARANCEUR CLOUDY* 10/28/2014 1802   LABSPEC 1.013 10/28/2014 1802   PHURINE 8.0 10/28/2014 1802   GLUCOSEU Negative 02/08/2015 1137   HGBUR 2+* 10/28/2014 1802   BILIRUBINUR Negative 02/08/2015 [place] 10/28/2014 1802   KETONESUR NEGATIVE 10/28/2014 1802   PROTEINUR NEGATIVE 10/28/2014 1802   NITRITE Positive* 02/08/2015 1137   NITRITE NEGATIVE 10/28/2014 1802   LEUKOCYTESUR 3+* 02/08/2015 [place] 10/28/2014 1802    Pertinent Imaging:   Assessment & Plan:    1. Microscopic hematuria- UA once again suspicious for infection.  Sent for culture today. - Urinalysis, Complete  2. Phimosis/balanitis- Mild. Foreskin is able to easily be retracted but I did not feel that patient is performing adequate hygiene practices. This is most likely the cause of his recurrent urinary tract infections. I recommended that patient be scheduled for a circumcision. He is in agreement with this plan and states that  It has been discussed previously with another provider but he did not follow up.   3. Recurrent UTI- Most likely due to uncircumcised phallus and poor penile hygiene.  Will perform circumcision.   Return for Schedule circumcision .  These notes generated with voice recognition software. I apologize for typographical errors.  Herbert Moors, [facility] [address], [place], [place] [address] [phone]

## 2015-03-12 ENCOUNTER — Telehealth: Payer: Self-pay

## 2015-03-12 DIAGNOSIS — N390 Urinary tract infection, site not specified: Secondary | ICD-10-CM

## 2015-03-12 LAB — CULTURE, URINE COMPREHENSIVE

## 2015-03-12 MED ORDER — CEFUROXIME AXETIL 250 MG PO TABS: 250.0000 mg | ORAL_TABLET | Freq: Two times a day (BID) | ORAL | Status: AC

## 2015-03-12 NOTE — Telephone Encounter (Signed)
Spoke with Aaron Stokes in reference to pt infection and need for abx. [name] stated he would make pt aware and make sure he gets medication. Medication called into pharmacy.

## 2015-03-12 NOTE — Telephone Encounter (Signed)
-----   Message from Roda Shutters, [place] sent at 03/12/2015  8:47 AM EST ----- Notify patient that his urine culture was positive for infection. Please send in a prescription for Ceftin 250 mg twice a day 7 days. thanks

## 2015-03-25 ENCOUNTER — Ambulatory Visit: Payer: Medicare Other | Admitting: Urology

## 2015-03-26 ENCOUNTER — Ambulatory Visit (INDEPENDENT_AMBULATORY_CARE_PROVIDER_SITE_OTHER): Payer: Medicare Other | Admitting: Urology

## 2015-03-26 VITALS — BP 143/77 | HR 66 | Ht 68.0 in | Wt 204.9 lb

## 2015-03-26 DIAGNOSIS — N471 Phimosis: Secondary | ICD-10-CM

## 2015-03-26 DIAGNOSIS — N390 Urinary tract infection, site not specified: Secondary | ICD-10-CM

## 2015-03-26 NOTE — Progress Notes (Signed)
    CC: Phimosis  HPI:  74 year old male with a history of urinary tract infections as well as significant phimosis/balanitis. He presents today for circumcision. Risk and benefits were previously discussed in detail.    Circumcision Procedure   Informed consistent obtained.  Risks discussed.  He was then prepped and draped in a sterile fashion.    Pre-Procedure: -A dorsal penile and ring block were administered using 20 cc 1% lidocaine.  The surgical site was then tested and adequate anesthesia was achieved.    Procedure: - Forceps were used to stretch the phimotic foreskin and expose the coronal area exposing the glans and meatus. -Two circumferential incision were made on the penile skin, one in the mid shaft and another approximately 1 cm proximal to the coronal margin using a blade. -The foreskin was then removed in a sleeve-like fashion with care taken to avoid injury to the urethra.  - Bleeding points were cauterized and adequate hemostasis was achieved - Skin margins were reapproximated with interrupted 3-0 chromic catgut sutures. -A dressing of Xeroform and gauze was applied.     Post-Procedure: -RTC in 4 weeks for wound check

## 2015-03-26 NOTE — Patient Instructions (Signed)
   Circumcision Information and Post Care Instructions  Preparation: Pubic Hair There is no need to completely shave your pubic hair but it is desirable to trim it fairly short. Trim your pubic hair a few days in advance of the operation to allow time for the cut ends to soften again. Hygiene On the morning of the circumcision ensure that you take a good bath or shower and pay particular attention to your genitals. Retract your foreskin as far as you can and clean well under it. Immediately before the time of the procedure empty your bowels and bladder.   After-care: After the procedure your whole penis will be swollen and look very bruised. This is a normal effect of both the injected anaesthetic and the handling it necessarily receives during the operation. These will gradually reduce over the next week or two. Underwear If you normally wear boxers you may find that they give insufficient support immediately post procedure. You may wish to consider some form of briefs which will hold your penis in position to provide support and reduce the friction The Bandage The bandage will normally be wound tightly around the penis. Leave for 48hrs and keep clean and dry.    Promoting Healing Do not apply any antiseptic cream to your penis, nor add any antiseptic to bath water. Though they do help to kill germs, most are corrosive to new skin and actually slow down healing. In the rare cases where an infection develops, see a doctor as soon as possible. Smoking can delay healing and place you at higher risk of infection. You should quit or significantly reduce the amount of cigarettes you smoke prior to and after procedure.  Pain Killers Everyone reacts differently in respect of pain. For most people circumcision will not be truly painful, but a degree of discomfort is to be expected during the first few days. If you choose to take pain killing tablets like Tylenol then follow the instructions precisely.  Do not take more than the recommended maximum dose. Do not take Aspirin or any Aspirin based product since these thin the blood and have an anti-clotting action which can increase bleeding from a wound.  The Stitches Stitches need to remain in place long enough for the cut edges to knit together but not so long as to allow the skin around them to fully heal. In practice this usually means they should remain for between 1 and 2 weeks. Although the doctor will normally use soluble (or self-dissolving) stitches and will dissolve/ fall out on their own.    Time off School or Work There is no absolute need to take time off school or work after circumcision, but you may find it very hard to concentrate on work for the first few days and so may find it useful to take a week off. A week (or even two) off work is very desirable if you do heavy lifting or if your job keeps you seated and unable to move around freely for long periods. You should naturally avoid energetic or contact sports, sexual activity, cycling and swimming until your circumcision has fully healed.      

## 2015-04-24 ENCOUNTER — Encounter: Payer: Self-pay | Admitting: Obstetrics and Gynecology

## 2015-04-24 ENCOUNTER — Ambulatory Visit (INDEPENDENT_AMBULATORY_CARE_PROVIDER_SITE_OTHER): Payer: Medicare Other | Admitting: Obstetrics and Gynecology

## 2015-04-24 VITALS — BP 121/81 | HR 114 | Temp 98.0°F | Resp 20 | Ht 68.0 in | Wt 194.0 lb

## 2015-04-24 DIAGNOSIS — N390 Urinary tract infection, site not specified: Secondary | ICD-10-CM

## 2015-04-24 DIAGNOSIS — N471 Phimosis: Secondary | ICD-10-CM

## 2015-04-24 LAB — URINALYSIS, COMPLETE
Bilirubin, UA: NEGATIVE
Glucose, UA: NEGATIVE
Nitrite, UA: POSITIVE — AB
Specific Gravity, UA: >1.03 — ABNORMAL HIGH (ref 1.005–1.030)
Urobilinogen, Ur: 1 mg/dL (ref 0.2–1.0)
pH, UA: 5.5 (ref 5.0–7.5)

## 2015-04-24 LAB — MICROSCOPIC EXAMINATION
Epithelial Cells (non renal): >10 /hpf — ABNORMAL HIGH (ref 0–10)
RBC, UA: NONE SEEN /hpf (ref 0–?)
WBC, UA: >30 /hpf — ABNORMAL HIGH (ref 0–?)

## 2015-04-24 NOTE — Progress Notes (Signed)
04/24/2015 11:01 AM   Aaron Stokes 08-Nov-1940 [id number]  Referring provider: Ricke Hey, MD [place], [place] [address]  Chief Complaint  Patient presents with  . Wound Check    HPI: Patient is a 75 year old male presenting today for 4 week wound check status post circumcision performed for phimosis and recurrent urinary tract infections. He reports his penile discomfort is continuing to improve. He reports no difficulty urinating. No gross hematuria. No fevers or flank pain.   PMH: Past Medical History  Diagnosis Date  . Hypertension   . Anxiety   . Insomnia   . Acid reflux     Surgical History: Past Surgical History  Procedure Laterality Date  . No past surgeries      Home Medications:    Medication List       This list is accurate as of: 04/24/15 11:01 AM.  Always use your most recent med list.               amLODipine 10 MG tablet  Commonly known as:  NORVASC     atenolol 50 MG tablet  Commonly known as:  TENORMIN  Take 50 mg by mouth daily.     flurazepam 30 MG capsule  Commonly known as:  DALMANE  take 1 capsule by mouth at bedtime if needed     nystatin-triamcinolone ointment  Commonly known as:  MYCOLOG  Apply 1 application topically 2 (two) times daily. Pull back foreskin and apply to head of penis     perphenazine-amitriptyline 4-25 MG Tabs tablet  Commonly known as:  ETRAFON/TRIAVIL        Allergies: No Known Allergies  Family History: Family History  Problem Relation Age of Onset  . Prostate cancer Neg Hx   . Bladder Cancer Father   . Bladder Cancer Paternal Grandmother     Social History:  reports that he has never smoked. He does not have any smokeless tobacco history on file. He reports that he does not drink alcohol or use illicit drugs.  ROS: UROLOGY Frequent Urination?: No Hard to postpone urination?: No Burning/pain with urination?: No Get up at night to urinate?: No Leakage of urine?: No Urine  stream starts and stops?: No Trouble starting stream?: No Do you have to strain to urinate?: No Blood in urine?: No Urinary tract infection?: No Sexually transmitted disease?: No Injury to kidneys or bladder?: No Painful intercourse?: No Weak stream?: No Erection problems?: No Penile pain?: No  Gastrointestinal Nausea?: No Vomiting?: No Indigestion/heartburn?: No Diarrhea?: No Constipation?: No  Constitutional Fever: No Night sweats?: No Weight loss?: No Fatigue?: No  Skin Skin rash/lesions?: No Itching?: No  Eyes Blurred vision?: No Double vision?: No  Ears/Nose/Throat Sore throat?: No Sinus problems?: No  Hematologic/Lymphatic Swollen glands?: No Easy bruising?: No  Cardiovascular Leg swelling?: No Chest pain?: No  Respiratory Cough?: No Shortness of breath?: No  Endocrine Excessive thirst?: No  Musculoskeletal Back pain?: No Joint pain?: No  Neurological Headaches?: No Dizziness?: No  Psychologic Depression?: No Anxiety?: No  Physical Exam: BP 121/81 mmHg  Pulse 114  Temp(Src) 98 F (36.7 C)  Resp 20  Ht 5\' 8"  (1.727 m)  Wt 194 lb (87.998 kg)  BMI 29.50 kg/m2  Constitutional:  Alert and oriented, No acute distress. HEENT: [place] AT, moist mucus membranes.  Trachea midline, no masses. Cardiovascular: No clubbing, cyanosis, or edema. Respiratory: Normal respiratory effort, no increased work of breathing. GU: retained gauze present, black in color with foul  odor, sutures desolved, no erythema or purulent drainage Skin: No rashes, bruises or suspicious lesions. Lymph: No cervical or inguinal adenopathy. Neurologic: Grossly intact, no focal deficits, moving all 4 extremities. Psychiatric: Normal mood and affect.  Laboratory Data:   Urinalysis >WBC Many bacteria Nitrite positive RBCs-0  Pertinent Imaging:  Assessment & Plan:   1. Phimosis-  Patient presents today for wound check 4 weeks status post circumcision. On examination   gauze was still in place circumscribing glans of his penis. It was removed without difficulty and site cleansed.  Surgical site appears to be healing well. No signs of infection. No retained sutures.  2. Recurrent UTI-  UA suspicious today though is likely that his contamination from retained dressing from his circumcision 4 weeks ago. He will return in 2 weeks and provide another urine specimen.  Should recurrent infections continue further workup will include evaluation for possible bladder stones or other urinary tract calculi or abnormalities. - Urinalysis, Complete   Return in about 2 weeks (around 05/08/2015) for Recheck UA/PVR.  These notes generated with voice recognition software. I apologize for typographical errors.  Herbert Moors, [facility] [address], [place], [place] [address] [phone]

## 2015-04-28 ENCOUNTER — Encounter: Payer: Self-pay | Admitting: Emergency Medicine

## 2015-04-28 ENCOUNTER — Emergency Department
Admission: EM | Admit: 2015-04-28 | Discharge: 2015-04-28 | Disposition: A | Discharge status: Home / Self Care | Payer: Medicare Other | Attending: Emergency Medicine | Admitting: Emergency Medicine

## 2015-04-28 DIAGNOSIS — N390 Urinary tract infection, site not specified: Secondary | ICD-10-CM | POA: Insufficient documentation

## 2015-04-28 DIAGNOSIS — R338 Other retention of urine: Secondary | ICD-10-CM | POA: Diagnosis not present

## 2015-04-28 DIAGNOSIS — Z79899 Other long term (current) drug therapy: Secondary | ICD-10-CM | POA: Diagnosis not present

## 2015-04-28 DIAGNOSIS — R339 Retention of urine, unspecified: Secondary | ICD-10-CM

## 2015-04-28 DIAGNOSIS — N401 Enlarged prostate with lower urinary tract symptoms: Secondary | ICD-10-CM | POA: Diagnosis not present

## 2015-04-28 DIAGNOSIS — I10 Essential (primary) hypertension: Secondary | ICD-10-CM | POA: Insufficient documentation

## 2015-04-28 DIAGNOSIS — R351 Nocturia: Secondary | ICD-10-CM | POA: Diagnosis not present

## 2015-04-28 LAB — BASIC METABOLIC PANEL
Anion gap: 8 (ref 5–15)
BUN: 12 mg/dL (ref 6–20)
CO2: 28 mmol/L (ref 22–32)
Calcium: 10.4 mg/dL — ABNORMAL HIGH (ref 8.9–10.3)
Chloride: 102 mmol/L (ref 101–111)
Creatinine, Ser: 1.17 mg/dL (ref 0.61–1.24)
GFR calc Af Amer: >60 mL/min (ref >60)
GFR calc non Af Amer: >60 mL/min (ref >60)
Glucose, Bld: 112 mg/dL — ABNORMAL HIGH (ref 65–99)
Potassium: 3.6 mmol/L (ref 3.5–5.1)
Sodium: 138 mmol/L (ref 135–145)

## 2015-04-28 LAB — URINALYSIS COMPLETE WITH MICROSCOPIC (ARMC ONLY)
Bilirubin Urine: NEGATIVE
Glucose, UA: NEGATIVE mg/dL
Ketones, ur: NEGATIVE mg/dL
Leukocytes, UA: NEGATIVE
Nitrite: NEGATIVE
Protein, ur: NEGATIVE mg/dL
Specific Gravity, Urine: 1.012 (ref 1.005–1.030)
pH: 7 (ref 5.0–8.0)

## 2015-04-28 LAB — CBC
HCT: 42.2 % (ref 40.0–52.0)
Hemoglobin: 13.9 g/dL (ref 13.0–18.0)
MCH: 30.2 pg (ref 26.0–34.0)
MCHC: 32.9 g/dL (ref 32.0–36.0)
MCV: 91.7 fL (ref 80.0–100.0)
Platelets: 225 10*3/uL (ref 150–440)
RBC: 4.6 MIL/uL (ref 4.40–5.90)
RDW: 13.9 % (ref 11.5–14.5)
WBC: 7.7 10*3/uL (ref 3.8–10.6)

## 2015-04-28 MED ORDER — SULFAMETHOXAZOLE-TRIMETHOPRIM 800-160 MG PO TABS: 1.0000 | ORAL_TABLET | Freq: Two times a day (BID) | ORAL | Status: DC

## 2015-04-28 MED ORDER — TAMSULOSIN HCL 0.4 MG PO CAPS: 0.4000 mg | ORAL_CAPSULE | Freq: Every day | ORAL | Status: DC

## 2015-04-28 MED ORDER — SULFAMETHOXAZOLE-TRIMETHOPRIM 800-160 MG PO TABS
1.0000 | ORAL_TABLET | Freq: Once | ORAL | Status: AC
  Administered 2015-04-28: 1 via ORAL
  Filled 2015-04-28: qty 1

## 2015-04-28 NOTE — Consult Note (Signed)
Urology Consult  Referring physician: Dr. Mariea Clonts Reason for referral: urinary retention, difficult foley placement  Chief Complaint: suprapubic pain  History of Present Illness: Aaron Stokes is a 75yo with a hx of BPH with LUTS who presented to the ER with a 7 hour hx of an inability to urinate. He had a prior retention episode in July 2016 and required foley catheter placement. He passed his voiding trial and had been doing fair with moderate LUTS since July. He underwent circumcision the beginning of December and since then his frequency, urgency, nocturia, and incomplete emptying have worsened. Currently he has severe sharp suprapubic pain. He denies any gross hematuria. He had a UTI in [place] that was successfully treated with antibiotics  Past Medical History  Diagnosis Date  . Hypertension   . Anxiety   . Insomnia   . Acid reflux    Past Surgical History  Procedure Laterality Date  . No past surgeries    . Circumcision, non-newborn      Medications: I have reviewed the patient's current medications. Allergies: No Known Allergies  Family History  Problem Relation Age of Onset  . Prostate cancer Neg Hx   . Bladder Cancer Father   . Bladder Cancer Paternal Grandmother    Social History:  reports that he has never smoked. He does not have any smokeless tobacco history on file. He reports that he does not drink alcohol or use illicit drugs.  Review of Systems  Gastrointestinal: Positive for nausea and abdominal pain.  Genitourinary: Positive for dysuria, urgency and frequency.  All other systems reviewed and are negative.   Physical Exam:  Vital signs in last 24 hours: Temp:  [97.6 F (36.4 C)] 97.6 F (36.4 C) (01/08 1210) Pulse Rate:  [57-74] 57 (01/08 1210) Resp:  [16-18] 16 (01/08 1210) BP: (110-145)/(59-79) 110/59 mmHg (01/08 1252) SpO2:  [100 %] 100 % (01/08 1257) Weight:  [87.998 kg (194 lb)] 87.998 kg (194 lb) (01/08 0915) Physical Exam  Constitutional: He is  oriented to person, place, and time. He appears well-developed and well-nourished.  HENT:  Head: Normocephalic and atraumatic.  Eyes: EOM are normal. Pupils are equal, round, and reactive to light.  Neck: Normal range of motion. No thyromegaly present.  Cardiovascular: Normal rate and regular rhythm.   Respiratory: Effort normal. No respiratory distress.  GI: Soft. He exhibits no distension and no mass. There is tenderness. There is no rebound and no guarding. Hernia confirmed negative in the right inguinal area and confirmed negative in the left inguinal area.  Genitourinary: Testes normal and penis normal. Right testis shows no mass, no swelling and no tenderness. Right testis is descended. Cremasteric reflex is not absent on the right side. Left testis shows no mass, no swelling and no tenderness. Left testis is descended. Cremasteric reflex is not absent on the left side. Circumcised.  Musculoskeletal: Normal range of motion. He exhibits no edema.  Neurological: He is alert and oriented to person, place, and time.  Skin: Skin is warm and dry.  Psychiatric: He has a normal mood and affect. His behavior is normal. Judgment and thought content normal.    Laboratory Data:  Results for orders placed or performed during the hospital encounter of 04/28/15 (from the past 72 hour(s))  CBC     Status: None   Collection Time: 04/28/15 10:22 AM  Result Value Ref Range   WBC 7.7 3.8 - 10.6 K/uL   RBC 4.60 4.40 - 5.90 MIL/uL   Hemoglobin 13.9 13.0 -  18.0 g/dL   HCT 42.2 40.0 - 52.0 %   MCV 91.7 80.0 - 100.0 fL   MCH 30.2 26.0 - 34.0 pg   MCHC 32.9 32.0 - 36.0 g/dL   RDW 13.9 11.5 - 14.5 %   Platelets 225 150 - 440 K/uL  Basic metabolic panel     Status: Abnormal   Collection Time: 04/28/15 10:22 AM  Result Value Ref Range   Sodium 138 135 - 145 mmol/Stokes   Potassium 3.6 3.5 - 5.1 mmol/Stokes   Chloride 102 101 - 111 mmol/Stokes   CO2 28 22 - 32 mmol/Stokes   Glucose, Bld 112 (H) 65 - 99 mg/dL   BUN 12 6 -  20 mg/dL   Creatinine, Ser 1.17 0.61 - 1.24 mg/dL   Calcium 10.4 (H) 8.9 - 10.3 mg/dL   GFR calc non Af Amer >60 >60 mL/min   GFR calc Af Amer >60 >60 mL/min    Comment: (NOTE) The eGFR has been calculated using the CKD EPI equation. This calculation has not been validated in all clinical situations. eGFR's persistently <60 mL/min signify possible Chronic Kidney Disease.    Anion gap 8 5 - 15  Urinalysis complete, with microscopic ([facility] only)     Status: Abnormal   Collection Time: 04/28/15 12:07 PM  Result Value Ref Range   Color, Urine YELLOW (A) YELLOW   APPearance CLOUDY (A) CLEAR   Glucose, UA NEGATIVE NEGATIVE mg/dL   Bilirubin Urine NEGATIVE NEGATIVE   Ketones, ur NEGATIVE NEGATIVE mg/dL   Specific Gravity, Urine 1.012 1.005 - 1.030   Hgb urine dipstick 3+ (A) NEGATIVE   pH 7.0 5.0 - 8.0   Protein, ur NEGATIVE NEGATIVE mg/dL   Nitrite NEGATIVE NEGATIVE   Leukocytes, UA NEGATIVE NEGATIVE   RBC / HPF TOO NUMEROUS TO COUNT 0 - 5 RBC/hpf   WBC, UA TOO NUMEROUS TO COUNT 0 - 5 WBC/hpf   Bacteria, UA MANY (A) NONE SEEN   Squamous Epithelial / LPF 0-5 (A) NONE SEEN   Mucous PRESENT    Hyaline Casts, UA PRESENT    Amorphous Crystal PRESENT    Recent Results (from the past 240 hour(s))  Microscopic Examination     Status: Abnormal   Collection Time: 04/24/15 10:04 AM  Result Value Ref Range Status   WBC, UA >30 (H) 0 -  5 /hpf Final   RBC, UA None seen 0 -  2 /hpf Final   Epithelial Cells (non renal) >10 (H) 0 - 10 /hpf Final   Bacteria, UA Many (A) None seen/Few Final   Creatinine:  Recent Labs  04/28/15 1022  CREATININE 1.17   Baseline Creatinine: unknwon  Impression/Assessment:  75yo with BPH with LUTS, nocturia, urinary retention  Plan:  1. 20 [place] Coude was successfully placed and 700cc of clear urine promptly drained 2. Please start flomax 0.57m daily 3. followup with Urology in 1 week for voiding trial  Aaron Stokes, Aaron Stokes 04/28/2015, 9:31 PM

## 2015-04-28 NOTE — ED Notes (Signed)
Attempted to insert urethral catheter.  Could not be pushed beyond prostate.

## 2015-04-28 NOTE — ED Provider Notes (Signed)
[facility] Emergency Department Provider Note  ____________________________________________  Time seen: Approximately 9:35 AM  I have reviewed the triage vital signs and the nursing notes.   HISTORY  Chief Complaint Urinary Retention    HPI Chanceton Booher is a 75 y.o. male with a history of recurrent UTIs, previous urinary retention, and status post circumcision for recurrent UTIs presenting with urinary retention. Patient reports that since 5 AM this morning he has had the urge to urinate but has been unable to pass even a drop. He has associated suprapubic pressure. He denies any symptoms prior to today, fever, chills, nausea or vomiting.   Past Medical History  Diagnosis Date  . Hypertension   . Anxiety   . Insomnia   . Acid reflux     Patient Active Problem List   Diagnosis Date Noted  . Acute urinary retention 11/26/2014  . Hypertension 11/26/2014  . Anxiety 11/26/2014  . Insomnia 11/26/2014    Past Surgical History  Procedure Laterality Date  . No past surgeries    . Circumcision, non-newborn      Current Outpatient Rx  Name  Route  Sig  Dispense  Refill  . amLODipine (NORVASC) 10 MG tablet               . atenolol (TENORMIN) 50 MG tablet   Oral   Take 50 mg by mouth daily.      0   . flurazepam (DALMANE) 30 MG capsule      take 1 capsule by mouth at bedtime if needed      0   . nystatin-triamcinolone ointment (MYCOLOG)   Topical   Apply 1 application topically 2 (two) times daily. Pull back foreskin and apply to head of penis Patient not taking: Reported on 04/24/2015   30 g   0   . perphenazine-amitriptyline (ETRAFON/TRIAVIL) 4-25 MG TABS            0   . tamsulosin (FLOMAX) 0.4 MG CAPS capsule   Oral   Take 1 capsule (0.4 mg total) by mouth daily after breakfast.   30 capsule   0     Allergies Review of patient's allergies indicates no known allergies.  Family History  Problem Relation Age of Onset  .  Prostate cancer Neg Hx   . Bladder Cancer Father   . Bladder Cancer Paternal Grandmother     Social History Social History  Substance Use Topics  . Smoking status: Never Smoker   . Smokeless tobacco: None  . Alcohol Use: No    Review of Systems Constitutional: No fever/chills area and no lightheadedness or syncope. Eyes: No visual changes. ENT: No sore throat. Cardiovascular: Denies chest pain, palpitations. Respiratory: Denies shortness of breath.  No cough. Gastrointestinal: Positive suprapubic abdominal pain.  No nausea, no vomiting.  No diarrhea.  No constipation. Genitourinary: Negative for dysuria. Positive for urinary retention. Negative for hematuria. Musculoskeletal: Negative for back pain. Skin: Negative for rash. Neurological: Negative for headaches, focal weakness or numbness.  10-point ROS otherwise negative.  ____________________________________________   PHYSICAL EXAM:  VITAL SIGNS: ED Triage Vitals  Enc Vitals Group     BP 04/28/15 0915 145/79 mmHg     Pulse Rate 04/28/15 0915 74     Resp 04/28/15 0915 18     Temp --      Temp src --      SpO2 --      Weight 04/28/15 0915 194 lb (87.998 kg)  Height 04/28/15 0915 5\' 8"  (1.727 m)     Head Cir --      Peak Flow --      Pain Score 04/28/15 0916 8     Pain Loc --      Pain Edu? --      Excl. in [place]? --     Constitutional: Alert and oriented. Well appearing and in no acute distress. Answer question appropriately. Eyes: Conjunctivae are normal.  EOMI. Head: Atraumatic. Nose: No congestion/rhinnorhea. Mouth/Throat: Mucous membranes are moist.  Neck: No stridor.  Supple.   Cardiovascular: Normal rate,  Respiratory: Normal respiratory effort.   Gastrointestinal: Abdomen is soft and nondistended. Patient has tenderness to palpation over the suprapubic area with a palpable bladder edge 2 cm below the umbilicus. Genitourinary: Normal-appearing external genitalia with circumcised penis. No  lesions. Musculoskeletal: No LE edema.  Neurologic:  Normal speech and language. No gross focal neurologic deficits are appreciated.  Skin:  Skin is warm, dry and intact. No rash noted. Psychiatric: Mood and affect are normal. Speech and behavior are normal.  Normal judgement  ____________________________________________   LABS (all labs ordered are listed, but only abnormal results are displayed)  Labs Reviewed  BASIC METABOLIC PANEL - Abnormal; Notable for the following:    Glucose, Bld 112 (*)    Calcium 10.4 (*)    All other components within normal limits  URINALYSIS COMPLETEWITH MICROSCOPIC ([facility] ONLY) - Abnormal; Notable for the following:    Color, Urine YELLOW (*)    APPearance CLOUDY (*)    Hgb urine dipstick 3+ (*)    Bacteria, UA MANY (*)    Squamous Epithelial / LPF 0-5 (*)    All other components within normal limits  URINE CULTURE  CBC   ____________________________________________  EKG  Not indicated ____________________________________________  RADIOLOGY  No results found.  ____________________________________________   PROCEDURES  Procedure(s) performed: None  Critical Care performed: No ____________________________________________   INITIAL IMPRESSION / ASSESSMENT AND PLAN / ED COURSE  Pertinent labs & imaging results that were available during my care of the patient were reviewed by me and considered in my medical decision making (see chart for details).  75 y.o. male with a history of recurrent UTIs, urinary retention in the past, presenting with urinary retention since 5 AM. The patient does have a palpable bladder edge. We will plan a postvoid residual, evaluate his creatinine, and evaluate for UTI.  ____________________________________________  FINAL CLINICAL IMPRESSION(S) / ED DIAGNOSES  Final diagnoses:  Urinary retention      NEW MEDICATIONS STARTED DURING THIS VISIT:  New Prescriptions   TAMSULOSIN (FLOMAX) 0.4 MG CAPS  CAPSULE    Take 1 capsule (0.4 mg total) by mouth daily after breakfast.     Eula Listen, MD 04/28/15 1248

## 2015-04-28 NOTE — ED Notes (Signed)
Foley catheter placed by Urologist and draining urine at this time.

## 2015-04-28 NOTE — Discharge Instructions (Signed)
Please return to the emergency department if you develop severe pain, you notice decreased or no flow from your catheter, fever, nausea or vomiting, or any other symptoms concerning to you.  Acute Urinary Retention, Male Acute urinary retention is the temporary inability to urinate. This is a common problem in older men. As men age their prostates become larger and block the flow of urine from the bladder. This is usually a problem that has come on gradually.  HOME CARE INSTRUCTIONS If you are sent home with a Foley catheter and a drainage system, you will need to discuss the best course of action with your health care provider. While the catheter is in, maintain a good intake of fluids. Keep the drainage bag emptied and lower than your catheter. This is so that contaminated urine will not flow back into your bladder, which could lead to a urinary tract infection. There are two main types of drainage bags. One is a large bag that usually is used at night. It has a good capacity that will allow you to sleep through the night without having to empty it. The second type is called a leg bag. It has a smaller capacity, so it needs to be emptied more frequently. However, the main advantage is that it can be attached by a leg strap and can go underneath your clothing, allowing you the freedom to move about or leave your home. Only take over-the-counter or prescription medicines for pain, discomfort, or fever as directed by your health care provider.  SEEK MEDICAL CARE IF:  You develop a low-grade fever.  You experience spasms or leakage of urine with the spasms. SEEK IMMEDIATE MEDICAL CARE IF:   You develop chills or fever.  Your catheter stops draining urine.  Your catheter falls out.  You start to develop increased bleeding that does not respond to rest and increased fluid intake. MAKE SURE YOU:  Understand these instructions.  Will watch your condition.  Will get help right away if you are  not doing well or get worse.   This information is not intended to replace advice given to you by your health care provider. Make sure you discuss any questions you have with your health care provider.   Document Released: 07/13/2000 Document Revised: 08/21/2014 Document Reviewed: 09/15/2012 Elsevier Interactive Patient Education [employer].

## 2015-04-28 NOTE — ED Notes (Signed)
Spoke with niece, Virgilio Belling and she states she will come and pick up pt, but it will take her a little bit. Discharge instructions given to pt. Pt ambulatory to lobby to wait on ride.

## 2015-04-28 NOTE — ED Notes (Signed)
Urologist at bedside. Foley Catheter kit given to MD.

## 2015-04-28 NOTE — ED Notes (Signed)
Pt presents to ED with c/o urinary retention since this morning. Pt attempted to urinate in triage but was unable to do so. Pt c/o pain to his bladder at this time. Per EMS pt had a circumcision on Dec 1. Pt has a hx of diabetes.

## 2015-04-28 NOTE — ED Notes (Signed)
Niece called to check on pt. Pt given phone to speak with Niece.

## 2015-04-28 NOTE — ED Notes (Signed)
2nd attempt at placing Foley. Resistance met and unable to obtain urine at this time. MD aware and Urologist called.

## 2015-04-30 LAB — URINE CULTURE: Culture: >=100000

## 2015-05-02 ENCOUNTER — Telehealth: Payer: Self-pay | Admitting: Pharmacist

## 2015-05-02 NOTE — Telephone Encounter (Signed)
Pt urine cx grew pseudomonas. Attempted to call pt at phone # in records. Not active. Called emergency contact (brother) and received a phone # for pt. However attempted to call multiple times, no answer and no voicemail. Niece, Virgilio Belling called hospital and spoke with Kathlee Nations, RN. I called niece back and informed her about the culture and that the antibiotic that was prescribed does no cover this bacteria. Requested that rx be called into [facility] on [address] nieces questions and directions for rx were given. Pt may stop bactrim. Rx for cipro 500mg  bid x 7 days was called into [facility]. Left message on voicemail. Authorizing provider is Dr. Edd Fabian.  Ramond Dial, Pharm.D Clinical Pharmacist  [facility] # [phone]

## 2015-05-06 NOTE — Telephone Encounter (Signed)
Encounter created in error

## 2015-05-08 ENCOUNTER — Other Ambulatory Visit: Payer: Self-pay | Admitting: Obstetrics and Gynecology

## 2015-05-08 ENCOUNTER — Encounter: Payer: Self-pay | Admitting: Obstetrics and Gynecology

## 2015-05-08 ENCOUNTER — Ambulatory Visit (INDEPENDENT_AMBULATORY_CARE_PROVIDER_SITE_OTHER): Payer: Medicare Other | Admitting: Obstetrics and Gynecology

## 2015-05-08 VITALS — BP 117/71 | HR 79 | Resp 16 | Ht 68.0 in | Wt 195.2 lb

## 2015-05-08 DIAGNOSIS — N3000 Acute cystitis without hematuria: Secondary | ICD-10-CM | POA: Diagnosis not present

## 2015-05-08 DIAGNOSIS — N400 Enlarged prostate without lower urinary tract symptoms: Secondary | ICD-10-CM | POA: Diagnosis not present

## 2015-05-08 DIAGNOSIS — R339 Retention of urine, unspecified: Secondary | ICD-10-CM | POA: Diagnosis not present

## 2015-05-08 MED ORDER — TAMSULOSIN HCL 0.4 MG PO CAPS: 0.4000 mg | ORAL_CAPSULE | Freq: Every day | ORAL | Status: DC

## 2015-05-08 NOTE — Progress Notes (Signed)
Cath Change/ Replacement  Patient is present today for a catheter change due to urinary retention.  10 ml of water was removed from the balloon, a 18FR coude cath was removed with out difficulty.  Patient was cleaned and prepped in a sterile fashion with betadine and 2% lidocaine jelly was instilled into the urethra. A 20 FR coude cath was replaced into the bladder no complications were noted Urine return was noted 300 ml and urine was light red in color. The balloon was filled with 4ml of sterile water. A leg bag was attached for drainage.  A night bag was also given to the patient and patient was given instruction on how to change from one bag to another. Patient was given proper instruction on catheter care.    Preformed by: Golden Hurter, CMA

## 2015-05-08 NOTE — Progress Notes (Signed)
05/08/2015 1:54 PM   Aaron Stokes 1941/02/02 [id number]  Referring provider: Ricke Hey, MD [place], [place] [address]  Chief Complaint  Patient presents with  . Phimosis    HPI: Patient is a 75 year old male with a history of BPH with LUTS, recurrent urinary tract infections and episodes of acute urinary retention presenting today for follow-up after recent ED visit on 04/28/15 for inability to void and suprapubic pain. He was found to be in urinary retention and was also noted to have a urinary tract infection.  A Foley catheter was placed by Dr. Alyson Ingles using a coud and approximately 700 mL's drained. He was started on daily Flomax.  Urine Culture + Pseudomonas- patient currently taking Cipro.  PMH: Past Medical History  Diagnosis Date  . Hypertension   . Anxiety   . Insomnia   . Acid reflux     Surgical History: Past Surgical History  Procedure Laterality Date  . No past surgeries    . Circumcision, non-newborn      Home Medications:    Medication List       This list is accurate as of: 05/08/15  1:54 PM.  Always use your most recent med list.               amLODipine 10 MG tablet  Commonly known as:  NORVASC     atenolol 50 MG tablet  Commonly known as:  TENORMIN  Take 50 mg by mouth daily.     flurazepam 30 MG capsule  Commonly known as:  DALMANE  take 1 capsule by mouth at bedtime if needed     nystatin-triamcinolone ointment  Commonly known as:  MYCOLOG  Apply 1 application topically 2 (two) times daily. Pull back foreskin and apply to head of penis     perphenazine-amitriptyline 4-25 MG Tabs tablet  Commonly known as:  ETRAFON/TRIAVIL     sulfamethoxazole-trimethoprim 800-160 MG tablet  Commonly known as:  BACTRIM DS,SEPTRA DS  Take 1 tablet by mouth 2 (two) times daily.     tamsulosin 0.4 MG Caps capsule  Commonly known as:  FLOMAX  Take 1 capsule (0.4 mg total) by mouth daily after breakfast.        Allergies: No  Known Allergies  Family History: Family History  Problem Relation Age of Onset  . Prostate cancer Neg Hx   . Bladder Cancer Father   . Bladder Cancer Paternal Grandmother     Social History:  reports that he has never smoked. He does not have any smokeless tobacco history on file. He reports that he does not drink alcohol or use illicit drugs.  ROS: UROLOGY Frequent Urination?: No Hard to postpone urination?: No Burning/pain with urination?: No Get up at night to urinate?: No Leakage of urine?: No Urine stream starts and stops?: No Trouble starting stream?: No Do you have to strain to urinate?: No Blood in urine?: No Urinary tract infection?: No Sexually transmitted disease?: No Injury to kidneys or bladder?: No Painful intercourse?: No Weak stream?: No Erection problems?: No Penile pain?: No  Gastrointestinal Nausea?: No Vomiting?: No Indigestion/heartburn?: No Diarrhea?: No Constipation?: No  Constitutional Fever: No Night sweats?: Yes Weight loss?: Yes Fatigue?: No  Skin Skin rash/lesions?: No Itching?: No  Eyes Blurred vision?: No Double vision?: No  Ears/Nose/Throat Sore throat?: No Sinus problems?: No  Hematologic/Lymphatic Swollen glands?: No Easy bruising?: No  Cardiovascular Leg swelling?: No Chest pain?: No  Respiratory Cough?: No Shortness of breath?: No  Endocrine Excessive thirst?: No  Musculoskeletal Back pain?: No Joint pain?: No  Neurological Headaches?: No Dizziness?: No  Psychologic Depression?: No Anxiety?: No  Physical Exam: BP 117/71 mmHg  Pulse 79  Resp 16  Ht 5\' 8"  (1.727 m)  Wt 195 lb 3.2 oz (88.542 kg)  BMI 29.69 kg/m2  Constitutional:  Alert and oriented, No acute distress. HEENT: [place] AT, moist mucus membranes.  Trachea midline, no masses. Cardiovascular: No clubbing, cyanosis, or edema. Respiratory: Normal respiratory effort, no increased work of breathing. GU:  Soiled Foley catheter present in  urethra Skin: No rashes, bruises or suspicious lesions. Lymph: No cervical or inguinal adenopathy. Neurologic: Grossly intact, no focal deficits, moving all 4 extremities. Psychiatric: Normal mood and affect.  Laboratory Data:   Urinalysis  Pertinent Imaging: Procedure note: 18Fr foley initially inserted but clotted off.  20Fr was inserted and irrigated briefly until draining very light red.  Patient tolerated well.  Patient provided leg back and instructions on home catheter care.  Assessment & Plan:    1. Urinary retention- Acute urinary retention. Recurrent. Most recent episode most likely related to patient's urinary tract infection exacerbating underlying BPH with LUTS. Voiding trial performed and patient returned with over 223mL in his bladder.  He is an unreliable historian.  We will reinsert a Foley catheter today and refer him for UDS.  - Urinalysis, Complete - BLADDER SCAN AMB NON-IMAGING  2. BPH with LUTS- Continue Flomax and follow up in 3 weeks for PVR/UA.  3. Urinary tract Infection- Patient instructed to complete Cipro as prescribed.  Return in about 3 weeks (around 05/29/2015) for recheck UA /PVR.  These notes generated with voice recognition software. I apologize for typographical errors.  Herbert Moors, [facility] [address], [place], [place] [address] [phone]

## 2015-05-08 NOTE — Addendum Note (Signed)
Addended by: Bettye Boeck on: 05/08/2015 04:48 PM   Modules accepted: Orders

## 2015-05-09 ENCOUNTER — Telehealth: Payer: Self-pay | Admitting: Obstetrics and Gynecology

## 2015-05-09 NOTE — Telephone Encounter (Signed)
Ria Comment ordered UDS for this patient and I scheduled this appointment  For 05-16-15 @ 12:45 He will follow up with [place] here on 05-29-15 I gave the appointment information to the patient's brother while he was still in the office.  I faxed over the notes to [facility] on 05-09-15  [facility]

## 2015-05-15 ENCOUNTER — Emergency Department
Admission: EM | Admit: 2015-05-15 | Discharge: 2015-05-15 | Disposition: A | Discharge status: Home / Self Care | Payer: Medicare Other | Attending: Emergency Medicine | Admitting: Emergency Medicine

## 2015-05-15 ENCOUNTER — Telehealth: Payer: Self-pay

## 2015-05-15 DIAGNOSIS — R609 Edema, unspecified: Secondary | ICD-10-CM | POA: Insufficient documentation

## 2015-05-15 DIAGNOSIS — Z79899 Other long term (current) drug therapy: Secondary | ICD-10-CM | POA: Diagnosis not present

## 2015-05-15 DIAGNOSIS — N390 Urinary tract infection, site not specified: Secondary | ICD-10-CM | POA: Insufficient documentation

## 2015-05-15 DIAGNOSIS — I10 Essential (primary) hypertension: Secondary | ICD-10-CM | POA: Insufficient documentation

## 2015-05-15 DIAGNOSIS — R319 Hematuria, unspecified: Secondary | ICD-10-CM | POA: Insufficient documentation

## 2015-05-15 DIAGNOSIS — T839XXA Unspecified complication of genitourinary prosthetic device, implant and graft, initial encounter: Secondary | ICD-10-CM | POA: Insufficient documentation

## 2015-05-15 DIAGNOSIS — N401 Enlarged prostate with lower urinary tract symptoms: Secondary | ICD-10-CM | POA: Insufficient documentation

## 2015-05-15 DIAGNOSIS — R339 Retention of urine, unspecified: Secondary | ICD-10-CM

## 2015-05-15 DIAGNOSIS — N400 Enlarged prostate without lower urinary tract symptoms: Secondary | ICD-10-CM

## 2015-05-15 LAB — BASIC METABOLIC PANEL
Anion gap: 8 (ref 5–15)
BUN: 10 mg/dL (ref 6–20)
CO2: 28 mmol/L (ref 22–32)
Calcium: 9.8 mg/dL (ref 8.9–10.3)
Chloride: 102 mmol/L (ref 101–111)
Creatinine, Ser: 1.07 mg/dL (ref 0.61–1.24)
GFR calc Af Amer: >60 mL/min (ref >60)
GFR calc non Af Amer: >60 mL/min (ref >60)
Glucose, Bld: 118 mg/dL — ABNORMAL HIGH (ref 65–99)
Potassium: 3.9 mmol/L (ref 3.5–5.1)
Sodium: 138 mmol/L (ref 135–145)

## 2015-05-15 LAB — URINALYSIS COMPLETE WITH MICROSCOPIC (ARMC ONLY)
Bacteria, UA: NONE SEEN
Bilirubin Urine: NEGATIVE
Glucose, UA: NEGATIVE mg/dL
Leukocytes, UA: NEGATIVE
Nitrite: NEGATIVE
Protein, ur: 30 mg/dL — AB
Specific Gravity, Urine: 1.01 (ref 1.005–1.030)
pH: 6 (ref 5.0–8.0)

## 2015-05-15 LAB — CBC
HCT: 37.5 % — ABNORMAL LOW (ref 40.0–52.0)
Hemoglobin: 12.3 g/dL — ABNORMAL LOW (ref 13.0–18.0)
MCH: 30.1 pg (ref 26.0–34.0)
MCHC: 32.7 g/dL (ref 32.0–36.0)
MCV: 92 fL (ref 80.0–100.0)
Platelets: 217 10*3/uL (ref 150–440)
RBC: 4.08 MIL/uL — ABNORMAL LOW (ref 4.40–5.90)
RDW: 13.8 % (ref 11.5–14.5)
WBC: 6.1 10*3/uL (ref 3.8–10.6)

## 2015-05-15 MED ORDER — LIDOCAINE HCL 2 % EX GEL
CUTANEOUS | Status: AC
  Filled 2015-05-15: qty 5

## 2015-05-15 MED ORDER — LIDOCAINE HCL 2 % EX GEL
1.0000 "application " | Freq: Once | CUTANEOUS | Status: AC
  Administered 2015-05-15: 1 via URETHRAL

## 2015-05-15 MED ORDER — LIDOCAINE HCL 2 % EX GEL
CUTANEOUS | Status: AC
  Administered 2015-05-15: 1 via URETHRAL
  Filled 2015-05-15: qty 10

## 2015-05-15 MED ORDER — SODIUM CHLORIDE 0.9 % IV BOLUS (SEPSIS)
1000.0000 mL | Freq: Once | INTRAVENOUS | Status: AC
  Administered 2015-05-15: 1000 mL via INTRAVENOUS

## 2015-05-15 MED ORDER — FENTANYL CITRATE (PF) 100 MCG/2ML IJ SOLN
50.0000 ug | Freq: Once | INTRAMUSCULAR | Status: AC
  Administered 2015-05-15: 50 ug via INTRAVENOUS
  Filled 2015-05-15: qty 2

## 2015-05-15 MED ORDER — DEXTROSE 5 % IV SOLN
7.0000 mg/kg | Freq: Once | INTRAVENOUS | Status: AC
Start: 2015-05-15 — End: 2015-05-15
  Administered 2015-05-15: 530 mg via INTRAVENOUS
  Filled 2015-05-15: qty 13.25

## 2015-05-15 NOTE — ED Notes (Signed)
MD at bedside. 

## 2015-05-15 NOTE — ED Notes (Addendum)
Pt from home via EMS. Has urinary catheter, C/O not being able to urinate in 2 days, also reports abd pain, reports feeling weak past couple days

## 2015-05-15 NOTE — ED Provider Notes (Signed)
[facility] Emergency Department Provider Note  ____________________________________________  Time seen: Approximately 8:23 AM  I have reviewed the triage vital signs and the nursing notes.   HISTORY  Chief Complaint Urinary Retention    HPI Aaron Stokes is a 75 y.o. 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 Patient reports that the last time he changed his urine leg bag was 2 days ago, and he has had no output. He denies any fever, chills, nausea or vomiting. He has suprapubic "tightness." He has followed up with urology, and has been scheduled for repeat visit in [facility] tomorrow. His niece who does not live with him is also concerned that he may not be eating or drinking appropriately.  Patient was seen here by me on 1/8 for same, with very difficult to pass the catheter due to his BPH. Urologist on-call came to the hospital and was able to pass a Coude with success. He was discharged with Flomax, and antibiotics for UTI. The patient reports that his microbiology showed resistance to original antibiotic I prescribed, and he was given a new prescription.   Past Medical History  Diagnosis Date  . Hypertension   . Anxiety   . Insomnia   . Acid reflux     Patient Active Problem List   Diagnosis Date Noted  . Foley catheter problem ([place])   . Hematuria   . Acute urinary retention 11/26/2014  . Hypertension 11/26/2014  . Anxiety 11/26/2014  . Insomnia 11/26/2014    Past Surgical History  Procedure Laterality Date  . No past surgeries    . Circumcision, non-newborn      Current Outpatient Rx  Name  Route  Sig  Dispense  Refill  . amLODipine (NORVASC) 10 MG tablet   Oral   Take 10 mg by mouth daily.          Marland Kitchen atenolol (TENORMIN) 50 MG tablet   Oral   Take 50 mg by mouth daily.       0   . perphenazine-amitriptyline (ETRAFON/TRIAVIL) 4-25 MG TABS   Oral   Take 0.5 tablets by mouth daily.       0   . tamsulosin (FLOMAX) 0.4 MG CAPS capsule   Oral   Take 1 capsule (0.4 mg total) by mouth daily after breakfast.   30 capsule   12   . nystatin-triamcinolone ointment (MYCOLOG)   Topical   Apply 1 application topically 2 (two) times daily. Pull back foreskin and apply to head of penis Patient not taking: Reported on 05/15/2015   30 g   0   . sulfamethoxazole-trimethoprim (BACTRIM DS,SEPTRA DS) 800-160 MG tablet   Oral   Take 1 tablet by mouth 2 (two) times daily. Patient not taking: Reported on 05/15/2015   14 tablet   0     Allergies Review of patient's allergies indicates no known allergies.  Family History  Problem Relation Age of Onset  . Prostate cancer Neg Hx   . Bladder Cancer Father   . Bladder Cancer Paternal Grandmother     Social History Social History  Substance Use Topics  . Smoking status: Never Smoker   . Smokeless tobacco: None  . Alcohol Use: No    Review of Systems Constitutional: No fever/chills. Positive generalized weakness. No lightheadedness or syncope. Eyes: No visual changes. Cardiovascular: Denies chest pain, palpitations. Respiratory:  Denies shortness of breath.  No cough. Gastrointestinal: Positive for suprapubic pain..  No nausea, no vomiting.  No diarrhea.  No constipation. Genitourinary: Positive for no urine output into fully back 2 days. Musculoskeletal: Negative for back pain. Skin: Negative for rash. Neurological: Negative for headaches, focal weakness or numbness.  10-point ROS otherwise negative.  ____________________________________________   PHYSICAL EXAM:  VITAL SIGNS: ED Triage Vitals  Enc Vitals Group     BP --      Pulse --      Resp --      Temp 05/15/15 0803 97.4 F (36.3 C)     Temp src --      SpO2 --      Weight --      Height --      Head Cir --      Peak Flow --      Pain  Score 05/15/15 0804 3     Pain Loc --      Pain Edu? --      Excl. in [place]? --     Constitutional: Alert and oriented. Well appearing and in no acute distress. Answer question appropriately. Eyes: Conjunctivae are normal.  EOMI. no scleral icterus. Head: Atraumatic. Nose: No congestion/rhinnorhea. Mouth/Throat: Mucous membranes are moist.  Neck: No stridor.  Supple.   Cardiovascular: Normal rate, regular rhythm. No murmurs, rubs or gallops.  Respiratory: Normal respiratory effort.  No retractions. Lungs CTAB.  No wheezes, rales or ronchi. Gastrointestinal: Abdomen is soft.  Mild distention with palpable bladder edge 1 cm above the umbilicus with associated tenderness to palpation. No guarding or rebound, no peritoneal signs. Genitourinary: Circumcised penis with Foley catheter in place, very little urine that is dark yellow without blood in the bag. Musculoskeletal: Mild nonpitting symmetric lower extremity edema bilaterally.  Neurologic:  Normal speech and language. No gross focal neurologic deficits are appreciated.  Skin:  Skin is warm, dry and intact. No rash noted. Psychiatric: Mood and affect are normal. Speech and behavior are normal.  Normal judgement.  ____________________________________________   LABS (all labs ordered are listed, but only abnormal results are displayed)  Labs Reviewed  CBC - Abnormal; Notable for the following:    RBC 4.08 (*)    Hemoglobin 12.3 (*)    HCT 37.5 (*)    All other components within normal limits  BASIC METABOLIC PANEL - Abnormal; Notable for the following:    Glucose, Bld 118 (*)    All other components within normal limits  URINALYSIS COMPLETEWITH MICROSCOPIC ([facility] ONLY) - Abnormal; Notable for the following:    Color, Urine YELLOW (*)    APPearance HAZY (*)    Ketones, ur TRACE (*)    Hgb urine dipstick 3+ (*)    Protein, ur 30 (*)    Squamous Epithelial / LPF 0-5 (*)    All other components within normal limits  URINE CULTURE    ____________________________________________  EKG  Not indicated ____________________________________________  RADIOLOGY  No results found.  ____________________________________________   PROCEDURES  Procedure(s) performed: None  Critical Care performed: No ____________________________________________   INITIAL IMPRESSION / ASSESSMENT AND PLAN / ED COURSE  Pertinent labs & imaging results that were available during my care of the patient were reviewed by me and considered in my medical decision making (see chart for details).  75 y.o. male with an indwelling Foley for BPH, urinary tract infection presenting with evidence of urinary retention. It is likely that he has compression of the Foley from  his prostate, or sediment or blood clot causing blockage. I will also check his kidney function although given his palpable bladder edge I believe he is making urine. We will attempt to replace the Foley, and if that is not possible, call urology.  ----------------------------------------- 8:54 AM on 05/15/2015 -----------------------------------------  I have spoken with the urologist on-call who will talk to [facility], who are the primary urologist for this patient, to have them come see the patient in the emergency department for Foley placement.  ----------------------------------------- 12:51 PM on 05/15/2015 -----------------------------------------  The patient was seen by the urologist on-call who was unable to place a Foley at the bedside. She will need to scope a Foley in, and plans to return to do this after a surgical procedure that she has upstairs. The patient remains in stable condition.  ----------------------------------------- 2:39 PM on 05/15/2015 -----------------------------------------  The urologist was able to place a Foley catheter by using a scope. She has reviewed the patient's urine culture results, and is requesting a single  dose of gentamicin as the patient is resistant to other oral medications and has pseudomonas which would be refractory to the Bactrim he is currently taking.  I will have him discontinue that medication. The urologist will see the patient as an outpatient. The patient's creatinine is reassuring and he will be discharged in stable condition. Return precautions and discharge instructions were discussed. _________________ ___________________________  FINAL CLINICAL IMPRESSION(S) / ED DIAGNOSES  Final diagnoses:  Urinary retention  Enlarged prostate  UTI (lower urinary tract infection)      NEW MEDICATIONS STARTED DURING THIS VISIT:  New Prescriptions   No medications on file     Eula Listen, MD 05/15/15 1440

## 2015-05-15 NOTE — Discharge Instructions (Signed)
You may stop taking the Bactrim medication. The urologist will give you further instructions about antibiotics for urinary tract infection.  Please return to the emergency department if your fully catheter is not draining urine for greater than 6 hours, or if you develop fever, nausea or vomiting, abdominal pain, or any other symptoms concerning to you.

## 2015-05-15 NOTE — Consult Note (Signed)
Urology Consult  I have been asked to see the patient by Dr. Mariea Clonts, for evaluation and management of difficult catheter, nondraining Foley.  Chief Complaint: catheter problems  History of Present Illness: Aaron Stokes is a 75 y.o. year old with a history of urinary retention, recurrent UTIs, difficult Foley, and hematuria. He presented to the emergency room today with a nondraining Foley catheter.  His previous Foley catheter is removed in the ER attempted to replace it but had difficulty.  Urology was called to assist with Foley catheter placement.  He is well-known to our office and was scheduled for urodynamics study tomorrow in [place]. He's been in retention for quite some time. He also underwent circumcision for severe phimosis thought to be possibly contributing to his urinary tract infections.  Today, he has no other complaints other than Foley catheter issues. No nausea or vomiting, no fevers or chills. He is most recently treated with Bactrim for urinary tract infection just over 2 weeks ago.  He's not had any imaging studies, PSA, cystoscopy, a rectal exam to date.    Past Medical History  Diagnosis Date  . Hypertension   . Anxiety   . Insomnia   . Acid reflux     Past Surgical History  Procedure Laterality Date  . No past surgeries    . Circumcision, non-newborn      Home Medications:    Medication List    ASK your doctor about these medications        amLODipine 10 MG tablet  Commonly known as:  NORVASC  Take 10 mg by mouth daily.     atenolol 50 MG tablet  Commonly known as:  TENORMIN  Take 50 mg by mouth daily.     nystatin-triamcinolone ointment  Commonly known as:  MYCOLOG  Apply 1 application topically 2 (two) times daily. Pull back foreskin and apply to head of penis     perphenazine-amitriptyline 4-25 MG Tabs tablet  Commonly known as:  ETRAFON/TRIAVIL  Take 0.5 tablets by mouth daily.     sulfamethoxazole-trimethoprim 800-160 MG  tablet  Commonly known as:  BACTRIM DS,SEPTRA DS  Take 1 tablet by mouth 2 (two) times daily.     tamsulosin 0.4 MG Caps capsule  Commonly known as:  FLOMAX  Take 1 capsule (0.4 mg total) by mouth daily after breakfast.        Allergies: No Known Allergies  Family History  Problem Relation Age of Onset  . Prostate cancer Neg Hx   . Bladder Cancer Father   . Bladder Cancer Paternal Grandmother     Social History:  reports that he has never smoked. He does not have any smokeless tobacco history on file. He reports that he does not drink alcohol or use illicit drugs.  ROS: A complete review of systems was performed.  All systems are negative except for pertinent findings as noted.  Physical Exam:  Vital signs in last 24 hours: Temp:  [97.4 F (36.3 C)] 97.4 F (36.3 C) (01/25 0803) Pulse Rate:  [62-74] 64 (01/25 1230) BP: (138-153)/(74-84) 144/74 mmHg (01/25 1230) SpO2:  [95 %-100 %] 97 % (01/25 1230) Constitutional:  Alert and oriented, No acute distress HEENT: [place] AT, moist mucus membranes.  Trachea midline, no masses Cardiovascular: No clubbing, cyanosis, or edema. Respiratory: Normal respiratory effort, no increased work of breathing. GI: Abdomen is soft, nontender, nondistended, no abdominal masses GU: No CVA tenderness.  Circumcised phallus with blood at the urethral meatus. Bilateral  descended testicles, no scrotal edema or tenderness. Skin: No rashes, bruises or suspicious lesion Neurologic: Grossly intact, no focal deficits, moving all 4 extremities Psychiatric: Normal mood and affect   Laboratory Data:   Recent Labs  05/15/15 0817  WBC 6.1  HGB 12.3*  HCT 37.5*    Recent Labs  05/15/15 0817  NA 138  K 3.9  CL 102  CO2 28  GLUCOSE 118*  BUN 10  CREATININE 1.07  CALCIUM 9.8   Results for orders placed or performed during the hospital encounter of 04/28/15  Urine culture     Status: None   Collection Time: 04/28/15 12:07 PM  Result Value Ref  Range Status   Specimen Description URINE, RANDOM  Final   Special Requests NONE  Final   Culture >=100,000 COLONIES/mL PSEUDOMONAS AERUGINOSA  Final   Report Status 04/30/2015 FINAL  Final   Organism ID, Bacteria PSEUDOMONAS AERUGINOSA  Final      Susceptibility   Pseudomonas aeruginosa - MIC*    CEFTAZIDIME Value in next row Sensitive      SENSITIVE2    CIPROFLOXACIN Value in next row Sensitive      SENSITIVE<=0.25    GENTAMICIN Value in next row Sensitive      SENSITIVE4    IMIPENEM Value in next row Sensitive      SENSITIVE2    PIP/TAZO Value in next row Sensitive      SENSITIVE<=4    CEFEPIME Value in next row Sensitive      SENSITIVE2    AMPICILLIN/SULBACTAM Value in next row Resistant      RESISTANT>=32    * >=100,000 COLONIES/mL PSEUDOMONAS AERUGINOSA     Radiologic Imaging: No results found.  Procedure: Attempted to place an 24 [place] coud catheter unsuccessfully meeting resistance in the prostatic fossa.  Blood noted at the tip of the Foley catheter upon removal.  At this point in time, the decision was made to place catheter cystoscopically. The patient was verbally consented and prepped in the standard sterile fashion. The  cystoscope was advanced per urethra into the bladder.  There was significant bleeding noted in the prosthetic fossa with a false passage posteriorly. Once I was able to enter the bladder, the urine was clear without any obvious clots or hematuria. A wire was passed under direct visualization into the bladder and the scope was removed. A 22 [place] council tip catheter was advanced over the wire into the bladder. The wire was withdrawn and the balloon was filled with 10 cc of sterile water. The urine was initially clear and became bloody after the first 700 cc was drained. A total of 750 cc were drained from the bladder.  Assessment/ Plan:  1. Difficult Foley/ prostatic urethral trauma- status post cystoscopically placed Foley catheter. Please leave  Foley catheter in place for 1 week.  2. Urinary retention- patient is scheduled for urodynamics tomorrow. Would defer this study given his history of difficult Foley placement today. I made him aware that he should call and reschedule this appointment. He will let us brother to and make those arrangements.  3. Recurrent UTIs- patient given a dose of gentamicin here in the ER.  Urine culture was sent today, we will treat as needed based on the urine culture results.  4.  Dispo- patient may be discharged with Foley catheter in place. He should follow up with [facility] after his urodynamic studies. He likely needs imaging studies in the form of CT urogram as well as  a office cystoscopy to complete his workup. He also needs a rectal exam and PSA but this was deferred given the significant prostatic manipulation today.  05/15/2015, 1:54 PM  Hollice Espy,  MD  Case was discussed with the ER physician who is agreeable with this plan.

## 2015-05-15 NOTE — Telephone Encounter (Deleted)
The pt spoke

## 2015-05-15 NOTE — ED Notes (Signed)
This RN and tech attempted to place foley, unable to pass. EDP made aware

## 2015-05-15 NOTE — ED Notes (Signed)
Pt's brother Marlana Latus requesting to be called when pt is D/C so he can give him a ride home. Number [phone]

## 2015-05-17 LAB — URINE CULTURE: Culture: NO GROWTH

## 2015-05-21 NOTE — Telephone Encounter (Signed)
I spoke w/ the pt brother about urine culture results. He was concern if he needs to be on abx. I informed him that someone will call him w/ culture results when they return.

## 2015-05-29 ENCOUNTER — Ambulatory Visit: Payer: Medicare Other

## 2015-06-20 ENCOUNTER — Ambulatory Visit: Payer: Medicare Other

## 2015-06-21 ENCOUNTER — Ambulatory Visit (INDEPENDENT_AMBULATORY_CARE_PROVIDER_SITE_OTHER): Payer: Medicare Other | Admitting: Urology

## 2015-06-21 ENCOUNTER — Encounter: Payer: Self-pay | Admitting: Urology

## 2015-06-21 VITALS — BP 135/75 | HR 60 | Ht 69.0 in | Wt 187.0 lb

## 2015-06-21 DIAGNOSIS — R3129 Other microscopic hematuria: Secondary | ICD-10-CM

## 2015-06-21 DIAGNOSIS — R339 Retention of urine, unspecified: Secondary | ICD-10-CM | POA: Diagnosis not present

## 2015-06-21 DIAGNOSIS — N400 Enlarged prostate without lower urinary tract symptoms: Secondary | ICD-10-CM | POA: Diagnosis not present

## 2015-06-21 NOTE — Progress Notes (Signed)
06/21/2015 11:10 AM   Aaron Stokes 1940-08-12 [id number]  Referring provider: Ricke Hey, MD [place], [place] [address]  No chief complaint on file.   HPI: Aaron Stokes is a 75 y.o. year old with a history of urinary retention, recurrent UTIs, difficult Foley, and hematuria. He presented to the emergency room today with a nondraining Foley catheter. His previous Foley catheter is removed in the ER attempted to replace it but had difficulty. Urology was called to assist with Foley catheter placement.  He is well-known to our office and was scheduled for urodynamics study tomorrow in [place]. He's been in retention for quite some time. He also underwent circumcision for severe phimosis thought to be possibly contributing to his urinary tract infections.  Today, he has no other complaints other than Foley catheter issues. No nausea or vomiting, no fevers or chills. He is most recently treated with Bactrim for urinary tract infection just over 2 weeks ago.  He's not had any imaging studies, PSA, cystoscopy, a rectal exam to date.  He was seen in the ED and urinary 2017 required cystoscopic placement of Foley catheter.   March 2017 interval history: The patient returns to discuss his urodynamic studies. The patient still has a Foley catheter in place which was changed 2 weeks ago. He has had no symptoms since he was last seen. He has no symptomatically UTIs in the interim. His Foley is draining well. Next  In review of his urodynamic studies, overall that showed an obstructive flow pattern. His maximum flow rate was 7 mL/s. His detrusor pressure maximum flow was 50 cm of water. The maximum detrusor pressure was 53 7 m water. His postvoid residual was 200 cc. His bladder excessive trabeculations. Maximum capacity was 400 cc   PMH: Past Medical History  Diagnosis Date  . Hypertension   . Anxiety   . Insomnia   . Acid reflux     Surgical History: Past  Surgical History  Procedure Laterality Date  . No past surgeries    . Circumcision, non-newborn      Home Medications:    Medication List       This list is accurate as of: 06/21/15 11:10 AM.  Always use your most recent med list.               amLODipine 10 MG tablet  Commonly known as:  NORVASC  Take 10 mg by mouth daily.     atenolol 50 MG tablet  Commonly known as:  TENORMIN  Take 50 mg by mouth daily.     nystatin-triamcinolone ointment  Commonly known as:  MYCOLOG  Apply 1 application topically 2 (two) times daily. Pull back foreskin and apply to head of penis     perphenazine-amitriptyline 4-25 MG Tabs tablet  Commonly known as:  ETRAFON/TRIAVIL  Take 0.5 tablets by mouth daily.        Allergies: No Known Allergies  Family History: Family History  Problem Relation Age of Onset  . Prostate cancer Neg Hx   . Bladder Cancer Father   . Bladder Cancer Paternal Grandmother     Social History:  reports that he has never smoked. He does not have any smokeless tobacco history on file. He reports that he does not drink alcohol or use illicit drugs.  ROS: UROLOGY Frequent Urination?: No Hard to postpone urination?: No Burning/pain with urination?: No Get up at night to urinate?: No Leakage of urine?: No Urine stream starts and  stops?: No Trouble starting stream?: No Do you have to strain to urinate?: No Blood in urine?: No Urinary tract infection?: No Sexually transmitted disease?: No Injury to kidneys or bladder?: No Painful intercourse?: No Weak stream?: No Erection problems?: No Penile pain?: No  Gastrointestinal Nausea?: No Vomiting?: No Indigestion/heartburn?: No Diarrhea?: No Constipation?: No  Constitutional Fever: No Night sweats?: No Weight loss?: No Fatigue?: No  Skin Skin rash/lesions?: No Itching?: No  Eyes Blurred vision?: No Double vision?: No  Ears/Nose/Throat Sore throat?: No Sinus problems?:  No  Hematologic/Lymphatic Swollen glands?: No Easy bruising?: No  Cardiovascular Leg swelling?: No Chest pain?: No  Respiratory Cough?: No Shortness of breath?: No  Endocrine Excessive thirst?: No  Musculoskeletal Back pain?: No Joint pain?: No  Neurological Headaches?: No Dizziness?: No  Psychologic Depression?: No Anxiety?: No  Physical Exam: BP 135/75 mmHg  Pulse 60  Ht 5\' 9"  (1.753 m)  Wt 187 lb (84.823 kg)  BMI 27.60 kg/m2  Constitutional:  Alert and oriented, No acute distress. HEENT: [place] AT, moist mucus membranes.  Trachea midline, no masses. Cardiovascular: No clubbing, cyanosis, or edema. Respiratory: Normal respiratory effort, no increased work of breathing. GI: Abdomen is soft, nontender, nondistended, no abdominal masses GU: No CVA tenderness.  Skin: No rashes, bruises or suspicious lesions. Lymph: No cervical or inguinal adenopathy. Neurologic: Grossly intact, no focal deficits, moving all 4 extremities. Psychiatric: Normal mood and affect.  Laboratory Data: Lab Results  Component Value Date   WBC 6.1 05/15/2015   HGB 12.3* 05/15/2015   HCT 37.5* 05/15/2015   MCV 92.0 05/15/2015   PLT 217 05/15/2015    Lab Results  Component Value Date   CREATININE 1.07 05/15/2015    No results found for: PSA  No results found for: TESTOSTERONE  No results found for: HGBA1C  Urinalysis    Component Value Date/Time   COLORURINE YELLOW* 05/15/2015 0817   APPEARANCEUR HAZY* 05/15/2015 0817   LABSPEC 1.010 05/15/2015 0817   PHURINE 6.0 05/15/2015 [place] 05/15/2015 0817   HGBUR 3+* 05/15/2015 [place] 05/15/2015 0817   BILIRUBINUR Negative 04/24/2015 1004   KETONESUR TRACE* 05/15/2015 0817   PROTEINUR 30* 05/15/2015 0817   NITRITE NEGATIVE 05/15/2015 0817   NITRITE Positive* 04/24/2015 1004   LEUKOCYTESUR NEGATIVE 05/15/2015 0817   LEUKOCYTESUR 1+* 04/24/2015 1004     Assessment & Plan:    The patient  has BPH with urinary retention. Recent urodynamic studies suggest obstructive flow pattern as the source of his retention. He has continued to develop intermittent retention despite Flomax. He is no longer on these medications. Permanent resolution of his urinary retention will likely need surgical intervention. We will begin surgical planning for this with a cystoscopy in the office to evaluate obstruction. He also had some history of microscopic hematuria in the setting of negative urine cultures so we will also get a CT urogram to assess for this. This will also help rule out others sources for his history of recurrent urinary tract infections.  1. Urinary retention -continue foley for now -cystoscopy in 2 weeks aft CT Urogram  2. BPH As above  3. Recurrent UTIs- No UTI's in last 2 months  4. Microscopic hematuria -CT Urogram/cystosocpy  Return in about 2 weeks (around 07/05/2015) for cystoscopy after CT [facility], [place] [address], [place], [place] [address] [phone]

## 2015-06-22 LAB — BASIC METABOLIC PANEL
BUN/Creatinine Ratio: 12 (ref 10–22)
BUN: 15 mg/dL (ref 8–27)
CO2: 24 mmol/L (ref 18–29)
Calcium: 10.5 mg/dL — ABNORMAL HIGH (ref 8.6–10.2)
Chloride: 98 mmol/L (ref 96–106)
Creatinine, Ser: 1.25 mg/dL (ref 0.76–1.27)
GFR calc Af Amer: 65 mL/min/{1.73_m2} (ref >59)
GFR calc non Af Amer: 56 mL/min/{1.73_m2} — ABNORMAL LOW (ref >59)
Glucose: 96 mg/dL (ref 65–99)
Potassium: 4.5 mmol/L (ref 3.5–5.2)
Sodium: 141 mmol/L (ref 134–144)

## 2015-06-26 ENCOUNTER — Other Ambulatory Visit: Payer: Self-pay | Admitting: Obstetrics and Gynecology

## 2015-07-05 ENCOUNTER — Other Ambulatory Visit: Payer: Medicare Other

## 2015-07-09 ENCOUNTER — Ambulatory Visit
Admission: RE | Admit: 2015-07-09 | Discharge: 2015-07-09 | Disposition: A | Discharge status: Home / Self Care | Payer: Medicare Other | Source: Ambulatory Visit | Attending: Urology | Admitting: Urology

## 2015-07-09 DIAGNOSIS — K8020 Calculus of gallbladder without cholecystitis without obstruction: Secondary | ICD-10-CM | POA: Diagnosis not present

## 2015-07-09 DIAGNOSIS — K5730 Diverticulosis of large intestine without perforation or abscess without bleeding: Secondary | ICD-10-CM | POA: Insufficient documentation

## 2015-07-09 DIAGNOSIS — R3129 Other microscopic hematuria: Secondary | ICD-10-CM | POA: Diagnosis not present

## 2015-07-09 DIAGNOSIS — D3502 Benign neoplasm of left adrenal gland: Secondary | ICD-10-CM | POA: Diagnosis not present

## 2015-07-09 DIAGNOSIS — N401 Enlarged prostate with lower urinary tract symptoms: Secondary | ICD-10-CM | POA: Insufficient documentation

## 2015-07-09 MED ORDER — IOPAMIDOL (ISOVUE-300) INJECTION 61%
125.0000 mL | Freq: Once | INTRAVENOUS | Status: AC | PRN
  Administered 2015-07-09: 125 mL via INTRAVENOUS

## 2015-07-11 ENCOUNTER — Other Ambulatory Visit: Payer: Medicare Other | Admitting: Urology

## 2015-07-25 ENCOUNTER — Encounter: Payer: Self-pay | Admitting: Urology

## 2015-07-25 ENCOUNTER — Ambulatory Visit (INDEPENDENT_AMBULATORY_CARE_PROVIDER_SITE_OTHER): Payer: Medicare Other | Admitting: Urology

## 2015-07-25 VITALS — BP 130/73 | HR 58 | Ht 68.0 in | Wt 182.8 lb

## 2015-07-25 DIAGNOSIS — N400 Enlarged prostate without lower urinary tract symptoms: Secondary | ICD-10-CM

## 2015-07-25 DIAGNOSIS — N390 Urinary tract infection, site not specified: Secondary | ICD-10-CM | POA: Diagnosis not present

## 2015-07-25 DIAGNOSIS — R9341 Abnormal radiologic findings on diagnostic imaging of renal pelvis, ureter, or bladder: Secondary | ICD-10-CM | POA: Diagnosis not present

## 2015-07-25 DIAGNOSIS — R3129 Other microscopic hematuria: Secondary | ICD-10-CM | POA: Diagnosis not present

## 2015-07-25 DIAGNOSIS — R339 Retention of urine, unspecified: Secondary | ICD-10-CM

## 2015-07-25 NOTE — Progress Notes (Signed)
07/25/2015 3:05 PM   Aaron Stokes 1940/05/14 [id number]  Referring provider: Ricke Hey, MD [place], [place] [address]  No chief complaint on file.   HPI: Aaron Stokes is a 75 y.o. year old with a history of urinary retention, recurrent UTIs, difficult Foley, and hematuria. He presented to the emergency room today with a nondraining Foley catheter. His previous Foley catheter is removed in the ER attempted to replace it but had difficulty. Urology was called to assist with Foley catheter placement.  He is well-known to our office and was scheduled for urodynamics study tomorrow in [place]. He's been in retention for quite some time. He also underwent circumcision for severe phimosis thought to be possibly contributing to his urinary tract infections.  Today, he has no other complaints other than Foley catheter issues. No nausea or vomiting, no fevers or chills. He is most recently treated with Bactrim for urinary tract infection just over 2 weeks ago.  He's not had any imaging studies, PSA, cystoscopy, a rectal exam to date.  He was seen in the ED and urinary 2017 required cystoscopic placement of Foley catheter.   March 2017 interval history: The patient returns to discuss his urodynamic studies. The patient still has a Foley catheter in place which was changed 2 weeks ago. He has had no symptoms since he was last seen. He has no symptomatically UTIs in the interim. His Foley is draining well. Next  In review of his urodynamic studies, overall that showed an obstructive flow pattern. His maximum flow rate was 7 mL/s. His detrusor pressure maximum flow was 50 cm of water. The maximum detrusor pressure was 53 7 m water. His postvoid residual was 200 cc. His bladder excessive trabeculations. Maximum capacity was 400 cc  April 2017 interval history: The patient returns after undergoing a CT urogram. A CT urogram showed a thickening of the right ureter was  concerning for neoplasm. He also was noted to have a massive prostate that was 8 cm x 8 cm x 8 cm. This explains his urinary retention. He also had some bladder wall thickening likely secondary to his massive prostate.    PMH: Past Medical History  Diagnosis Date  . Hypertension   . Anxiety   . Insomnia   . Acid reflux     Surgical History: Past Surgical History  Procedure Laterality Date  . No past surgeries    . Circumcision, non-newborn      Home Medications:    Medication List       This list is accurate as of: 07/25/15  3:05 PM.  Always use your most recent med list.               amLODipine 10 MG tablet  Commonly known as:  NORVASC  Take 10 mg by mouth daily.     atenolol 50 MG tablet  Commonly known as:  TENORMIN  Take 50 mg by mouth daily.     nystatin-triamcinolone ointment  Commonly known as:  MYCOLOG  Apply 1 application topically 2 (two) times daily. Pull back foreskin and apply to head of penis     perphenazine-amitriptyline 4-25 MG Tabs tablet  Commonly known as:  ETRAFON/TRIAVIL  Take 0.5 tablets by mouth daily.        Allergies: No Known Allergies  Family History: Family History  Problem Relation Age of Onset  . Prostate cancer Neg Hx   . Bladder Cancer Father   . Bladder Cancer Paternal  Grandmother     Social History:  reports that he has never smoked. He does not have any smokeless tobacco history on file. He reports that he does not drink alcohol or use illicit drugs.  ROS:                                        Physical Exam: There were no vitals taken for this visit.  Constitutional:  Alert and oriented, No acute distress. HEENT: [place] AT, moist mucus membranes.  Trachea midline, no masses. Cardiovascular: No clubbing, cyanosis, or edema. Respiratory: Normal respiratory effort, no increased work of breathing. GI: Abdomen is soft, nontender, nondistended, no abdominal masses GU: No CVA tenderness.  Skin: No  rashes, bruises or suspicious lesions. Lymph: No cervical or inguinal adenopathy. Neurologic: Grossly intact, no focal deficits, moving all 4 extremities. Psychiatric: Normal mood and affect.  Laboratory Data: Lab Results  Component Value Date   WBC 6.1 05/15/2015   HGB 12.3* 05/15/2015   HCT 37.5* 05/15/2015   MCV 92.0 05/15/2015   PLT 217 05/15/2015    Lab Results  Component Value Date   CREATININE 1.25 06/21/2015    No results found for: PSA  No results found for: TESTOSTERONE  No results found for: HGBA1C  Urinalysis    Component Value Date/Time   COLORURINE YELLOW* 05/15/2015 0817   APPEARANCEUR HAZY* 05/15/2015 0817   APPEARANCEUR Cloudy* 04/24/2015 1004   LABSPEC 1.010 05/15/2015 0817   PHURINE 6.0 05/15/2015 [place] 05/15/2015 0817   HGBUR 3+* 05/15/2015 [place] 05/15/2015 0817   BILIRUBINUR Negative 04/24/2015 1004   KETONESUR TRACE* 05/15/2015 0817   PROTEINUR 30* 05/15/2015 0817   PROTEINUR 2+* 04/24/2015 1004   NITRITE NEGATIVE 05/15/2015 0817   NITRITE Positive* 04/24/2015 1004   LEUKOCYTESUR NEGATIVE 05/15/2015 0817   LEUKOCYTESUR 1+* 04/24/2015 1004    Pertinent Imaging: CLINICAL DATA: Microscopic hematuria. Recurrent urinary tract infection. BPH. Urinary retention requiring Foley drainage.  EXAM: CT ABDOMEN AND PELVIS WITHOUT AND WITH CONTRAST  TECHNIQUE: Multidetector CT imaging of the abdomen and pelvis was performed following the standard protocol before and following the bolus administration of intravenous contrast.  CONTRAST: 1109mL ISOVUE-300 IOPAMIDOL (ISOVUE-300) INJECTION 61%  COMPARISON: None.  FINDINGS: Lower chest: No significant pulmonary nodules or acute consolidative airspace disease.  Hepatobiliary: Normal liver with no liver mass. Multiple calcified layering gallstones measuring up to 1.0 cm within the gallbladder, with no gallbladder wall thickening or pericholecystic  fluid. No biliary ductal dilatation.  Pancreas: Normal, with no mass or duct dilation.  Spleen: Normal size. No mass.  Adrenals/Urinary Tract: Normal right adrenal. 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 No renal stones. No hydronephrosis. Normal caliber ureters, with no ureteral stones. Small simple parapelvic renal cyst in the interpolar left renal sinus. Three scattered subcentimeter hypodense renal cortical lesions in the mid to lower left kidney, too small to characterize. There is mild urothelial wall thickening involving an approximately 1.5 cm in length segment of the right ureter at the junction of the lumbar and pelvic segments of the right ureter anterior to the right common iliac artery (series 13/image 50 and series 15/image 47). Otherwise no urothelial wall thickening or focal urothelial mass in the renal collecting systems or ureters, noting non opacification of much of the lumbar segment of the left ureter, limiting  evaluation in this location. Moderate diffuse bladder wall thickening. Nonspecific fat stranding surrounding the urinary bladder. No bladder stones. No focal bladder mass. Foley catheter is well positioned within the urinary bladder, which is relatively collapsed.  Stomach/Bowel: Grossly normal stomach. Normal caliber small bowel with no small bowel wall thickening. Normal appendix. Mild diverticulosis in the proximal sigmoid colon, with no large bowel wall thickening or pericolonic fat stranding.  Vascular/Lymphatic: Normal caliber abdominal aorta. Patent portal, splenic, hepatic and renal veins. No pathologically enlarged lymph nodes in the abdomen or pelvis.  Reproductive: Massive prostatomegaly, with prostate volume 355 cc (9.8 x 8.7 x 8.0 cm). Nonspecific coarse calcifications throughout the posterior prostate. Prominent mass effect on the bladder base by the markedly enlarged central lobe of the  prostate.  Other: No pneumoperitoneum, ascites or focal fluid collection.  Musculoskeletal: No aggressive appearing focal osseous lesions. There is increased sclerosis, trabecular thickening and overall mild expansion of the L4 and L5 vertebral bodies, most consistent with Paget's disease. Moderate degenerative changes in the visualized thoracolumbar spine.  IMPRESSION: 1. No urolithiasis. No hydronephrosis . 2. Nonspecific mild urothelial wall thickening involving a 1.5 cm segment of the right ureter at the junction of the lumbar and pelvic segments of the right ureter. Findings could be due to ureteral spasm, inflammation or neoplasm. Consider correlation with retrograde pyelography and ureteral brushings versus follow-up hematuria protocol CT abdomen/pelvis without and with IV contrast in 3 months, as clinically warranted. 3. Massive prostatomegaly. 4. Moderate diffuse bladder wall thickening with perivesical fat stranding. Foley catheter in place within the bladder lumen. Acute cystitis cannot be excluded. Correlate with urinalysis. 5. Additional findings include cholelithiasis, left adrenal adenoma, mild sigmoid diverticulosis and probable Paget's disease of the L4 and L5 vertebral bodies.  Assessment & Plan:    The patient has 2 significant problems this time. 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 I discussed the method for this to be cystoscopy, right ureteroscopy, laser ablation of the tumor after biopsy, possible right ureteral stent. I discussed the patient due to his massive prostate, I may have difficulty accessing his right ureter which point we would need to address this with imaging follow-up rather than direct visualization. I discussed the risks, benefits, indications procedure. He understands that I may not be able to access the ureter during this procedure.  The patient has a massive prostate that is 8 x 8  cm x 8 cm. This decreased to a volume of over 250 cc. I discussed with the patient the only surgical management that would sufficiently address his issue would be a simple prostatectomy. I do not believe that he will pass a trial of void prior to this procedure. I recommended continuing her Foley catheter until that time.   1. Urinary retention -continue foley for now. Will exchange for now  2. BPH We will need simple prostatectomy after right ureteral filling defect addressed.  3. Recurrent UTIs- No UTI's in last 3 months since foley placed  4. Microscopic hematuria -cystoscopy as below  5. Right ureteral filling defect Cystoscopy, right ureteroscopy, laser ablation of right ureteral tumor, ureteral biopsy, possible right ureteral stent   Nickie Retort, [place] [address], [place], [place] [address] [phone]

## 2015-07-25 NOTE — Progress Notes (Signed)
Cath Change/ Replacement  Patient is present today for a catheter change due to urinary retention. 55ml of water was removed from the balloon. Then I instilled 42ml of sterile water into the bladder and 16FR foley cath was removed with out difficulty.  Patient was cleaned and prepped in a sterile fashion with betadine and 2% lidocaine jelly was instilled into the urethra. A 16 FR coude foley cath was replaced into the bladder no complications were noted Urine return was noted 50 ml and urine was yellow in color. The balloon was filled with 8 ml of sterile water. A leg bag was attached for drainage.  A night bag was also given to the patient and patient was given instruction on how to change from one bag to another. Patient was given proper instruction on catheter care.     Preformed by: K.Russell,CMA  Follow up: 6 weeks for next catheter change

## 2015-07-26 ENCOUNTER — Telehealth: Payer: Self-pay | Admitting: Radiology

## 2015-07-26 NOTE — Telephone Encounter (Signed)
Notified Marlana Latus, pt's brother in law, of surgery scheduled 08/21/15, pre-admit testing appt on 08/07/15 @11 :15 and to call day prior to surgery for arrival time to SDS. [name] voices understanding.

## 2015-08-07 ENCOUNTER — Encounter
Admission: RE | Admit: 2015-08-07 | Discharge: 2015-08-07 | Disposition: A | Discharge status: Home / Self Care | Payer: Medicare Other | Source: Ambulatory Visit | Attending: Urology | Admitting: Urology

## 2015-08-07 DIAGNOSIS — Z01812 Encounter for preprocedural laboratory examination: Secondary | ICD-10-CM | POA: Diagnosis not present

## 2015-08-07 HISTORY — DX: Chronic kidney disease, unspecified: N18.9

## 2015-08-07 NOTE — Patient Instructions (Signed)
  Your procedure is scheduled on:Aug 21, 2015 (Wednesday) Report to Day Surgery. ([facility]) SECOND FLOOR To find out your arrival time please call [phone] between 1PM - 3PM on Aug 20, 2015 (Tuesday).  Remember: Instructions that are not followed completely may result in serious medical risk, up to and including death, or upon the discretion of your surgeon and anesthesiologist your surgery may need to be rescheduled.    __x__ 1. Do not eat food or drink liquids after midnight. No gum chewing or hard candies.     ____ 2. No Alcohol for 24 hours before or after surgery.   ____ 3. Bring all medications with you on the day of surgery if instructed.    __x__ 4. Notify your doctor if there is any change in your medical condition     (cold, fever, infections).     Do not wear jewelry, make-up, hairpins, clips or nail polish.  Do not wear lotions, powders, or perfumes. You may wear deodorant.  Do not shave 48 hours prior to surgery. Men may shave face and neck.  Do not bring valuables to the hospital.    [facility] is not responsible for any belongings or valuables.               Contacts, dentures or bridgework may not be worn into surgery.  Leave your suitcase in the car. After surgery it may be brought to your room.  For patients admitted to the hospital, discharge time is determined by your                treatment team.   Patients discharged the day of surgery will not be allowed to drive home.   Please read over the following fact sheets that you were given:   Surgical Site Infection Prevention   ____ Take these medicines the morning of surgery with A SIP OF WATER:    1.  2.   3.   4.  5.  6.  ____ Fleet Enema (as directed)   ____ Use CHG Soap as directed  ____ Use inhalers on the day of surgery  ____ Stop metformin 2 days prior to surgery    ____ Take 1/2 of usual insulin dose the night before surgery and none on the morning of surgery.   ____ Stop  Coumadin/Plavix/aspirin on (N/A)  _x___ Stop Anti-inflammatories on (NO NSAIDS) Tylenol ok to take for pain if needed  ____ Stop supplements until after surgery.    ____ Bring C-Pap to the hospital.

## 2015-08-10 LAB — URINE CULTURE: Culture: >=100000 — AB

## 2015-08-14 ENCOUNTER — Other Ambulatory Visit: Payer: Self-pay

## 2015-08-14 ENCOUNTER — Telehealth: Payer: Self-pay | Admitting: Radiology

## 2015-08-14 DIAGNOSIS — N390 Urinary tract infection, site not specified: Secondary | ICD-10-CM

## 2015-08-14 MED ORDER — SULFAMETHOXAZOLE-TRIMETHOPRIM 800-160 MG PO TABS: 1.0000 | ORAL_TABLET | Freq: Two times a day (BID) | ORAL | Status: DC

## 2015-08-14 NOTE — Telephone Encounter (Signed)
LMOM. Prescription for Bactrim sent to pharmacy to treat UTI. Pt needs to start taking medication today. Need pt to RTC on 5/1 for repeat ucx.

## 2015-08-14 NOTE — Telephone Encounter (Signed)
Spoke with pt's brother-in-law, Marlana Latus, regarding prescription sent to pharmacy to treat UTI. Appt made for repeat ucx on 08/19/15 @1 :24. [name] voices understanding.

## 2015-08-19 ENCOUNTER — Other Ambulatory Visit: Payer: Medicare Other

## 2015-08-19 DIAGNOSIS — N390 Urinary tract infection, site not specified: Secondary | ICD-10-CM

## 2015-08-19 LAB — MICROSCOPIC EXAMINATION
Epithelial Cells (non renal): >10 /hpf — AB (ref 0–10)
RBC, UA: >30 /hpf — AB (ref 0–?)
WBC, UA: >30 /hpf — AB (ref 0–?)

## 2015-08-19 LAB — URINALYSIS, COMPLETE
Bilirubin, UA: NEGATIVE
Glucose, UA: NEGATIVE
Ketones, UA: NEGATIVE
Nitrite, UA: POSITIVE — AB
Specific Gravity, UA: >1.03 — ABNORMAL HIGH (ref 1.005–1.030)
Urobilinogen, Ur: 0.2 mg/dL (ref 0.2–1.0)
pH, UA: 6 (ref 5.0–7.5)

## 2015-08-21 ENCOUNTER — Encounter: Payer: Self-pay | Admitting: *Deleted

## 2015-08-21 ENCOUNTER — Telehealth: Payer: Self-pay

## 2015-08-21 ENCOUNTER — Ambulatory Visit: Payer: Medicare Other | Admitting: Anesthesiology

## 2015-08-21 ENCOUNTER — Encounter
Admission: RE | Disposition: A | Discharge status: Home / Self Care | Payer: Self-pay | Source: Ambulatory Visit | Attending: Urology

## 2015-08-21 ENCOUNTER — Ambulatory Visit
Admission: RE | Admit: 2015-08-21 | Discharge: 2015-08-21 | Disposition: A | Discharge status: Home / Self Care | Payer: Medicare Other | Source: Ambulatory Visit | Attending: Urology | Admitting: Urology

## 2015-08-21 DIAGNOSIS — F419 Anxiety disorder, unspecified: Secondary | ICD-10-CM | POA: Insufficient documentation

## 2015-08-21 DIAGNOSIS — R9341 Abnormal radiologic findings on diagnostic imaging of renal pelvis, ureter, or bladder: Secondary | ICD-10-CM | POA: Insufficient documentation

## 2015-08-21 DIAGNOSIS — Z87440 Personal history of urinary (tract) infections: Secondary | ICD-10-CM | POA: Insufficient documentation

## 2015-08-21 DIAGNOSIS — I10 Essential (primary) hypertension: Secondary | ICD-10-CM | POA: Diagnosis not present

## 2015-08-21 DIAGNOSIS — N401 Enlarged prostate with lower urinary tract symptoms: Secondary | ICD-10-CM | POA: Insufficient documentation

## 2015-08-21 DIAGNOSIS — K5730 Diverticulosis of large intestine without perforation or abscess without bleeding: Secondary | ICD-10-CM | POA: Insufficient documentation

## 2015-08-21 DIAGNOSIS — K219 Gastro-esophageal reflux disease without esophagitis: Secondary | ICD-10-CM | POA: Insufficient documentation

## 2015-08-21 DIAGNOSIS — K8020 Calculus of gallbladder without cholecystitis without obstruction: Secondary | ICD-10-CM | POA: Diagnosis not present

## 2015-08-21 DIAGNOSIS — R3129 Other microscopic hematuria: Secondary | ICD-10-CM

## 2015-08-21 DIAGNOSIS — Z79899 Other long term (current) drug therapy: Secondary | ICD-10-CM | POA: Diagnosis not present

## 2015-08-21 DIAGNOSIS — R338 Other retention of urine: Secondary | ICD-10-CM | POA: Diagnosis present

## 2015-08-21 DIAGNOSIS — N2889 Other specified disorders of kidney and ureter: Secondary | ICD-10-CM

## 2015-08-21 DIAGNOSIS — N390 Urinary tract infection, site not specified: Secondary | ICD-10-CM

## 2015-08-21 HISTORY — PX: CYSTOSCOPY WITH STENT PLACEMENT: SHX5790

## 2015-08-21 SURGERY — CYSTOSCOPY, WITH STENT INSERTION
Anesthesia: General | Laterality: Right | Wound class: Clean Contaminated

## 2015-08-21 MED ORDER — FAMOTIDINE 20 MG PO TABS
20.0000 mg | ORAL_TABLET | Freq: Once | ORAL | Status: AC
  Administered 2015-08-21: 20 mg via ORAL

## 2015-08-21 MED ORDER — FLUORESCEIN SODIUM 10 % IV SOLN
INTRAVENOUS | Status: DC | PRN
  Administered 2015-08-21: 25 mg via INTRAVENOUS

## 2015-08-21 MED ORDER — LACTATED RINGERS IV SOLN
INTRAVENOUS | Status: DC
  Administered 2015-08-21 (×2): via INTRAVENOUS

## 2015-08-21 MED ORDER — SODIUM CHLORIDE 0.9 % IV SOLN
1.0000 g | Freq: Once | INTRAVENOUS | Status: AC
  Administered 2015-08-21: 1 g via INTRAVENOUS

## 2015-08-21 MED ORDER — FLUORESCEIN SODIUM 10 % IV SOLN
INTRAVENOUS | Status: AC
  Filled 2015-08-21: qty 5

## 2015-08-21 MED ORDER — ONDANSETRON HCL 4 MG/2ML IJ SOLN
4.0000 mg | Freq: Once | INTRAMUSCULAR | Status: DC | PRN
Start: 2015-08-21 — End: 2015-08-21

## 2015-08-21 MED ORDER — GENTAMICIN IN SALINE 1.6-0.9 MG/ML-% IV SOLN
80.0000 mg | Freq: Once | INTRAVENOUS | Status: AC
  Administered 2015-08-21 (×2): 80 mg via INTRAVENOUS
  Filled 2015-08-21: qty 50

## 2015-08-21 MED ORDER — LIDOCAINE HCL (CARDIAC) 20 MG/ML IV SOLN
INTRAVENOUS | Status: DC | PRN
  Administered 2015-08-21: 30 mg via INTRAVENOUS

## 2015-08-21 MED ORDER — EPHEDRINE SULFATE 50 MG/ML IJ SOLN
INTRAMUSCULAR | Status: DC | PRN
  Administered 2015-08-21 (×2): 25 mg via INTRAVENOUS

## 2015-08-21 MED ORDER — PROPOFOL 10 MG/ML IV BOLUS
INTRAVENOUS | Status: DC | PRN
  Administered 2015-08-21: 150 mg via INTRAVENOUS

## 2015-08-21 MED ORDER — FENTANYL CITRATE (PF) 100 MCG/2ML IJ SOLN: 25.0000 ug | INTRAMUSCULAR | Status: DC | PRN

## 2015-08-21 MED ORDER — FAMOTIDINE 20 MG PO TABS
ORAL_TABLET | ORAL | Status: AC
  Administered 2015-08-21: 20 mg via ORAL
  Filled 2015-08-21: qty 1

## 2015-08-21 MED ORDER — FENTANYL CITRATE (PF) 100 MCG/2ML IJ SOLN
INTRAMUSCULAR | Status: DC | PRN
  Administered 2015-08-21: 100 ug via INTRAVENOUS

## 2015-08-21 MED ORDER — SULFAMETHOXAZOLE-TRIMETHOPRIM 800-160 MG PO TABS: 1.0000 | ORAL_TABLET | Freq: Two times a day (BID) | ORAL | Status: AC

## 2015-08-21 MED ORDER — HYDROCODONE-ACETAMINOPHEN 5-325 MG PO TABS: 1.0000 | ORAL_TABLET | Freq: Four times a day (QID) | ORAL | Status: DC | PRN

## 2015-08-21 MED ORDER — ONDANSETRON HCL 4 MG/2ML IJ SOLN
INTRAMUSCULAR | Status: DC | PRN
  Administered 2015-08-21: 4 mg via INTRAVENOUS

## 2015-08-21 MED ORDER — SODIUM CHLORIDE 0.9 % IV SOLN
INTRAVENOUS | Status: AC
  Filled 2015-08-21: qty 1000

## 2015-08-21 MED ORDER — MIDAZOLAM HCL 2 MG/2ML IJ SOLN
INTRAMUSCULAR | Status: DC | PRN
  Administered 2015-08-21: 2 mg via INTRAVENOUS

## 2015-08-21 MED ORDER — GLYCOPYRROLATE 0.2 MG/ML IJ SOLN
INTRAMUSCULAR | Status: DC | PRN
  Administered 2015-08-21: 0.2 mg via INTRAVENOUS

## 2015-08-21 MED ORDER — PHENYLEPHRINE HCL 10 MG/ML IJ SOLN
INTRAMUSCULAR | Status: DC | PRN
  Administered 2015-08-21 (×2): 200 ug via INTRAVENOUS

## 2015-08-21 SURGICAL SUPPLY — 31 items
ADAPTER SCOPE UROLOK II (MISCELLANEOUS) ×2 IMPLANT
BACTOSHIELD CHG 4% 4OZ (MISCELLANEOUS) ×1
BASKET ZERO TIP 1.9FR (BASKET) IMPLANT
CATH FOL 2WAY LX 20X30 (CATHETERS) ×2 IMPLANT
CATH URETL 5X70 OPEN END (CATHETERS) ×2 IMPLANT
CNTNR SPEC 2.5X3XGRAD LEK (MISCELLANEOUS) ×1
CONT SPEC 4OZ STER OR WHT (MISCELLANEOUS) ×1
CONTAINER SPEC 2.5X3XGRAD LEK (MISCELLANEOUS) ×1 IMPLANT
FEE TECHNICIAN ONLY PER HOUR (MISCELLANEOUS) IMPLANT
GLOVE BIO SURGEON STRL SZ7 (GLOVE) ×4 IMPLANT
GLOVE BIO SURGEON STRL SZ7.5 (GLOVE) ×2 IMPLANT
GOWN STRL REUS W/ TWL LRG LVL4 (GOWN DISPOSABLE) ×1 IMPLANT
GOWN STRL REUS W/TWL LRG LVL4 (GOWN DISPOSABLE) ×1
GOWN STRL REUS W/TWL XL LVL3 (GOWN DISPOSABLE) ×2 IMPLANT
GUIDEWIRE SUPER STIFF (WIRE) IMPLANT
KIT RM TURNOVER CYSTO AR (KITS) ×2 IMPLANT
LASER FIBER 200M SMARTSCOPE (Laser) ×2 IMPLANT
LASER HOLMIUM FIBER SU 272UM (MISCELLANEOUS) IMPLANT
PACK CYSTO AR (MISCELLANEOUS) ×2 IMPLANT
SCRUB CHG 4% DYNA-HEX 4OZ (MISCELLANEOUS) ×1 IMPLANT
SENSORWIRE 0.038 NOT ANGLED (WIRE) ×2
SET CYSTO W/LG BORE CLAMP LF (SET/KITS/TRAYS/PACK) ×2 IMPLANT
SHEATH URETERAL 13/15X36 1L (SHEATH) IMPLANT
SOL .9 NS 3000ML IRR  AL (IV SOLUTION) ×1
SOL .9 NS 3000ML IRR UROMATIC (IV SOLUTION) ×1 IMPLANT
STENT URET 6FRX24 CONTOUR (STENTS) IMPLANT
STENT URET 6FRX26 CONTOUR (STENTS) IMPLANT
SURGILUBE 2OZ TUBE FLIPTOP (MISCELLANEOUS) ×2 IMPLANT
SYRINGE IRR TOOMEY STRL 70CC (SYRINGE) ×2 IMPLANT
WATER STERILE IRR 1000ML POUR (IV SOLUTION) ×2 IMPLANT
WIRE SENSOR 0.038 NOT ANGLED (WIRE) ×1 IMPLANT

## 2015-08-21 NOTE — Telephone Encounter (Signed)
-----   Message from Nickie Retort, MD sent at 08/21/2015  9:50 AM EDT ----- Patient needs to f/u in 1 month with a CT urogram just prior to appt

## 2015-08-21 NOTE — Anesthesia Postprocedure Evaluation (Signed)
Anesthesia Post Note  Patient: Aaron Stokes  Procedure(s) Performed: Procedure(s) (LRB): cystoscopy (Right)  Patient location during evaluation: PACU Anesthesia Type: General Level of consciousness: awake and alert Pain management: pain level controlled Vital Signs Assessment: post-procedure vital signs reviewed and stable Respiratory status: spontaneous breathing, nonlabored ventilation, respiratory function stable and patient connected to nasal cannula oxygen Cardiovascular status: blood pressure returned to baseline and stable Postop Assessment: no signs of nausea or vomiting Anesthetic complications: no    Last Vitals:  Filed Vitals:   08/21/15 1114 08/21/15 1134  BP:  127/62  Pulse:  55  Temp: 36.9 C 36.5 C  Resp:  16    Last Pain:  Filed Vitals:   08/21/15 1200  PainSc: Asleep                 Martha Clan

## 2015-08-21 NOTE — Discharge Instructions (Signed)
AMBULATORY SURGERY  °DISCHARGE INSTRUCTIONS ° ° °1) The drugs that you were given will stay in your system until tomorrow so for the next 24 hours you should not: ° °A) Drive an automobile °B) Make any legal decisions °C) Drink any alcoholic beverage ° ° °2) You may resume regular meals tomorrow.  Today it is better to start with liquids and gradually work up to solid foods. ° °You may eat anything you prefer, but it is better to start with liquids, then soup and crackers, and gradually work up to solid foods. ° ° °3) Please notify your doctor immediately if you have any unusual bleeding, trouble breathing, redness and pain at the surgery site, drainage, fever, or pain not relieved by medication. ° ° ° °4) Additional Instructions: ° ° ° ° ° ° ° °Please contact your physician with any problems or Same Day Surgery at [phone], Monday through Friday 6 am to 4 pm, or [place] [facility] [place] Main number at [phone]. °

## 2015-08-21 NOTE — H&P (View-Only) (Signed)
07/25/2015 3:05 PM   Aaron Stokes 21-Jan-1941 [id number]  Referring provider: Ricke Hey, MD [place], [place] [address]  No chief complaint on file.   HPI: Aaron Stokes is a 75 y.o. year old with a history of urinary retention, recurrent UTIs, difficult Foley, and hematuria. He presented to the emergency room today with a nondraining Foley catheter. His previous Foley catheter is removed in the ER attempted to replace it but had difficulty. Urology was called to assist with Foley catheter placement.  He is well-known to our office and was scheduled for urodynamics study tomorrow in [place]. He's been in retention for quite some time. He also underwent circumcision for severe phimosis thought to be possibly contributing to his urinary tract infections.  Today, he has no other complaints other than Foley catheter issues. No nausea or vomiting, no fevers or chills. He is most recently treated with Bactrim for urinary tract infection just over 2 weeks ago.  He's not had any imaging studies, PSA, cystoscopy, a rectal exam to date.  He was seen in the ED and urinary 2017 required cystoscopic placement of Foley catheter.   March 2017 interval history: The patient returns to discuss his urodynamic studies. The patient still has a Foley catheter in place which was changed 2 weeks ago. He has had no symptoms since he was last seen. He has no symptomatically UTIs in the interim. His Foley is draining well. Next  In review of his urodynamic studies, overall that showed an obstructive flow pattern. His maximum flow rate was 7 mL/s. His detrusor pressure maximum flow was 50 cm of water. The maximum detrusor pressure was 53 7 m water. His postvoid residual was 200 cc. His bladder excessive trabeculations. Maximum capacity was 400 cc  April 2017 interval history: The patient returns after undergoing a CT urogram. A CT urogram showed a thickening of the right ureter was  concerning for neoplasm. He also was noted to have a massive prostate that was 8 cm x 8 cm x 8 cm. This explains his urinary retention. He also had some bladder wall thickening likely secondary to his massive prostate.    PMH: Past Medical History  Diagnosis Date  . Hypertension   . Anxiety   . Insomnia   . Acid reflux     Surgical History: Past Surgical History  Procedure Laterality Date  . No past surgeries    . Circumcision, non-newborn      Home Medications:    Medication List       This list is accurate as of: 07/25/15  3:05 PM.  Always use your most recent med list.               amLODipine 10 MG tablet  Commonly known as:  NORVASC  Take 10 mg by mouth daily.     atenolol 50 MG tablet  Commonly known as:  TENORMIN  Take 50 mg by mouth daily.     nystatin-triamcinolone ointment  Commonly known as:  MYCOLOG  Apply 1 application topically 2 (two) times daily. Pull back foreskin and apply to head of penis     perphenazine-amitriptyline 4-25 MG Tabs tablet  Commonly known as:  ETRAFON/TRIAVIL  Take 0.5 tablets by mouth daily.        Allergies: No Known Allergies  Family History: Family History  Problem Relation Age of Onset  . Prostate cancer Neg Hx   . Bladder Cancer Father   . Bladder Cancer Paternal  Grandmother     Social History:  reports that he has never smoked. He does not have any smokeless tobacco history on file. He reports that he does not drink alcohol or use illicit drugs.  ROS:                                        Physical Exam: There were no vitals taken for this visit.  Constitutional:  Alert and oriented, No acute distress. HEENT: [place] AT, moist mucus membranes.  Trachea midline, no masses. Cardiovascular: No clubbing, cyanosis, or edema. Respiratory: Normal respiratory effort, no increased work of breathing. GI: Abdomen is soft, nontender, nondistended, no abdominal masses GU: No CVA tenderness.  Skin: No  rashes, bruises or suspicious lesions. Lymph: No cervical or inguinal adenopathy. Neurologic: Grossly intact, no focal deficits, moving all 4 extremities. Psychiatric: Normal mood and affect.  Laboratory Data: Lab Results  Component Value Date   WBC 6.1 05/15/2015   HGB 12.3* 05/15/2015   HCT 37.5* 05/15/2015   MCV 92.0 05/15/2015   PLT 217 05/15/2015    Lab Results  Component Value Date   CREATININE 1.25 06/21/2015    No results found for: PSA  No results found for: TESTOSTERONE  No results found for: HGBA1C  Urinalysis    Component Value Date/Time   COLORURINE YELLOW* 05/15/2015 0817   APPEARANCEUR HAZY* 05/15/2015 0817   APPEARANCEUR Cloudy* 04/24/2015 1004   LABSPEC 1.010 05/15/2015 0817   PHURINE 6.0 05/15/2015 [place] 05/15/2015 0817   HGBUR 3+* 05/15/2015 [place] 05/15/2015 0817   BILIRUBINUR Negative 04/24/2015 1004   KETONESUR TRACE* 05/15/2015 0817   PROTEINUR 30* 05/15/2015 0817   PROTEINUR 2+* 04/24/2015 1004   NITRITE NEGATIVE 05/15/2015 0817   NITRITE Positive* 04/24/2015 1004   LEUKOCYTESUR NEGATIVE 05/15/2015 0817   LEUKOCYTESUR 1+* 04/24/2015 1004    Pertinent Imaging: CLINICAL DATA: Microscopic hematuria. Recurrent urinary tract infection. BPH. Urinary retention requiring Foley drainage.  EXAM: CT ABDOMEN AND PELVIS WITHOUT AND WITH CONTRAST  TECHNIQUE: Multidetector CT imaging of the abdomen and pelvis was performed following the standard protocol before and following the bolus administration of intravenous contrast.  CONTRAST: 173mL ISOVUE-300 IOPAMIDOL (ISOVUE-300) INJECTION 61%  COMPARISON: None.  FINDINGS: Lower chest: No significant pulmonary nodules or acute consolidative airspace disease.  Hepatobiliary: Normal liver with no liver mass. Multiple calcified layering gallstones measuring up to 1.0 cm within the gallbladder, with no gallbladder wall thickening or pericholecystic  fluid. No biliary ductal dilatation.  Pancreas: Normal, with no mass or duct dilation.  Spleen: Normal size. No mass.  Adrenals/Urinary Tract: Normal right adrenal. 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 No renal stones. No hydronephrosis. Normal caliber ureters, with no ureteral stones. Small simple parapelvic renal cyst in the interpolar left renal sinus. Three scattered subcentimeter hypodense renal cortical lesions in the mid to lower left kidney, too small to characterize. There is mild urothelial wall thickening involving an approximately 1.5 cm in length segment of the right ureter at the junction of the lumbar and pelvic segments of the right ureter anterior to the right common iliac artery (series 13/image 50 and series 15/image 47). Otherwise no urothelial wall thickening or focal urothelial mass in the renal collecting systems or ureters, noting non opacification of much of the lumbar segment of the left ureter, limiting  evaluation in this location. Moderate diffuse bladder wall thickening. Nonspecific fat stranding surrounding the urinary bladder. No bladder stones. No focal bladder mass. Foley catheter is well positioned within the urinary bladder, which is relatively collapsed.  Stomach/Bowel: Grossly normal stomach. Normal caliber small bowel with no small bowel wall thickening. Normal appendix. Mild diverticulosis in the proximal sigmoid colon, with no large bowel wall thickening or pericolonic fat stranding.  Vascular/Lymphatic: Normal caliber abdominal aorta. Patent portal, splenic, hepatic and renal veins. No pathologically enlarged lymph nodes in the abdomen or pelvis.  Reproductive: Massive prostatomegaly, with prostate volume 355 cc (9.8 x 8.7 x 8.0 cm). Nonspecific coarse calcifications throughout the posterior prostate. Prominent mass effect on the bladder base by the markedly enlarged central lobe of the  prostate.  Other: No pneumoperitoneum, ascites or focal fluid collection.  Musculoskeletal: No aggressive appearing focal osseous lesions. There is increased sclerosis, trabecular thickening and overall mild expansion of the L4 and L5 vertebral bodies, most consistent with Paget's disease. Moderate degenerative changes in the visualized thoracolumbar spine.  IMPRESSION: 1. No urolithiasis. No hydronephrosis . 2. Nonspecific mild urothelial wall thickening involving a 1.5 cm segment of the right ureter at the junction of the lumbar and pelvic segments of the right ureter. Findings could be due to ureteral spasm, inflammation or neoplasm. Consider correlation with retrograde pyelography and ureteral brushings versus follow-up hematuria protocol CT abdomen/pelvis without and with IV contrast in 3 months, as clinically warranted. 3. Massive prostatomegaly. 4. Moderate diffuse bladder wall thickening with perivesical fat stranding. Foley catheter in place within the bladder lumen. Acute cystitis cannot be excluded. Correlate with urinalysis. 5. Additional findings include cholelithiasis, left adrenal adenoma, mild sigmoid diverticulosis and probable Paget's disease of the L4 and L5 vertebral bodies.  Assessment & Plan:    The patient has 2 significant problems this time. 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 I discussed the method for this to be cystoscopy, right ureteroscopy, laser ablation of the tumor after biopsy, possible right ureteral stent. I discussed the patient due to his massive prostate, I may have difficulty accessing his right ureter which point we would need to address this with imaging follow-up rather than direct visualization. I discussed the risks, benefits, indications procedure. He understands that I may not be able to access the ureter during this procedure.  The patient has a massive prostate that is 8 x 8  cm x 8 cm. This decreased to a volume of over 250 cc. I discussed with the patient the only surgical management that would sufficiently address his issue would be a simple prostatectomy. I do not believe that he will pass a trial of void prior to this procedure. I recommended continuing her Foley catheter until that time.   1. Urinary retention -continue foley for now. Will exchange for now  2. BPH We will need simple prostatectomy after right ureteral filling defect addressed.  3. Recurrent UTIs- No UTI's in last 3 months since foley placed  4. Microscopic hematuria -cystoscopy as below  5. Right ureteral filling defect Cystoscopy, right ureteroscopy, laser ablation of right ureteral tumor, ureteral biopsy, possible right ureteral stent   Nickie Retort, [place] [address], [place], [place] [address] [phone]

## 2015-08-21 NOTE — Telephone Encounter (Signed)
done

## 2015-08-21 NOTE — Interval H&P Note (Signed)
History and Physical Interval Note:  08/21/2015 8:12 AM  Aaron Stokes  has presented today for surgery, with the diagnosis of RIGHT URETERAL MASS,BPH  The various methods of treatment have been discussed with the patient and family. After consideration of risks, benefits and other options for treatment, the patient has consented to  Procedure(s): URETEROSCOPY WITH HOLMIUM LASER LITHOTRIPSY / LASER ABLATION OF URETERAL LESION (Right) CYSTOSCOPY WITH STENT PLACEMENT (Right) URETERAL BIOPSY (Right) as a surgical intervention .  The patient's history has been reviewed, patient examined, no change in status, stable for surgery.  I have reviewed the patient's chart and labs.  Questions were answered to the patient's satisfaction.    RRR Unlabored respirations  Horald Pollen [place]

## 2015-08-21 NOTE — OR Nursing (Signed)
Catheter changed to leg bag per patient request.

## 2015-08-21 NOTE — Anesthesia Preprocedure Evaluation (Signed)
Anesthesia Evaluation  Patient identified by MRN, date of birth, ID band Patient awake    Reviewed: Allergy & Precautions, H&P , NPO status , Patient's Chart, lab work & pertinent test results, reviewed documented beta blocker date and time   History of Anesthesia Complications Negative for: history of anesthetic complications  Airway Mallampati: I  TM Distance: >3 FB Neck ROM: full    Dental no notable dental hx. (+) Missing, Poor Dentition   Pulmonary neg pulmonary ROS,    Pulmonary exam normal breath sounds clear to auscultation       Cardiovascular Exercise Tolerance: Good hypertension, On Medications (-) CAD, (-) Past MI, (-) Cardiac Stents and (-) CABG Normal cardiovascular exam(-) dysrhythmias (-) Valvular Problems/Murmurs Rhythm:regular Rate:Normal     Neuro/Psych negative neurological ROS  negative psych ROS   GI/Hepatic Neg liver ROS, GERD  ,  Endo/Other  negative endocrine ROS  Renal/GU CRFRenal disease  negative genitourinary   Musculoskeletal   Abdominal   Peds  Hematology negative hematology ROS (+)   Anesthesia Other Findings Past Medical History:   Hypertension                                                 Anxiety                                                      Insomnia                                                     Acid reflux                                                  Chronic kidney disease                                         Comment:UTI; hematuria   Reproductive/Obstetrics negative OB ROS                             Anesthesia Physical Anesthesia Plan  ASA: II  Anesthesia Plan: General   Post-op Pain Management:    Induction:   Airway Management Planned:   Additional Equipment:   Intra-op Plan:   Post-operative Plan:   Informed Consent: I have reviewed the patients History and Physical, chart, labs and discussed the procedure  including the risks, benefits and alternatives for the proposed anesthesia with the patient or authorized representative who has indicated his/her understanding and acceptance.   Dental Advisory Given  Plan Discussed with: Anesthesiologist, CRNA and Surgeon  Anesthesia Plan Comments:         Anesthesia Quick Evaluation

## 2015-08-21 NOTE — Transfer of Care (Signed)
Immediate Anesthesia Transfer of Care Note  Patient: Aaron Stokes  Procedure(s) Performed: Procedure(s): cystoscopy (Right)  Patient Location: PACU  Anesthesia Type:General  Level of Consciousness: awake and oriented  Airway & Oxygen Therapy: Patient Spontanous Breathing and Patient connected to face mask oxygen  Post-op Assessment: Report given to RN and Post -op Vital signs reviewed and stable  Post vital signs: Reviewed and stable  Last Vitals:  Filed Vitals:   08/21/15 0803  BP: 119/72  Pulse: 75  Temp: 35.8 C  Resp: 20    Last Pain: There were no vitals filed for this visit.       Complications: No apparent anesthesia complications

## 2015-08-21 NOTE — Telephone Encounter (Signed)
CT urogram orders placed.

## 2015-08-21 NOTE — OR Nursing (Signed)
Patient has urinary cath upon arrival to hospital today he will also be discharged with one today.  Cath care discussed with patient.

## 2015-08-21 NOTE — Op Note (Signed)
Date of procedure: 08/21/2015  Preoperative diagnosis:  1. Right ureteral filling defect 2. BPH with urinary retention   Postoperative diagnosis:  1. Right ureteral filling defect 2. BPH with urinary retention   Procedure: 1. Cystoscopy 2. Attempted right ureteroscopy  Surgeon: Baruch Gouty, MD  Anesthesia: General  Complications: None  Intraoperative findings: The patient had a massive prostate as expected. I was unable to localize the right ureteral orifice due to the significant intravesical protrusion of the prostate, so ureteroscopy was unable to be performed.  EBL: None  Specimens: None  Drains: 20 French Foley catheter  Disposition: Stable to the postanesthesia care unit  Indication for procedure: The patient is a 75 y.o. male with a greater than 250 cc prostate and urinary retention with a right ureteral filling defect presents for ureteroscopic visualization of the filling defect.  After reviewing the management options for treatment, the patient elected to proceed with the above surgical procedure(s). We have discussed the potential benefits and risks of the procedure, side effects of the proposed treatment, the likelihood of the patient achieving the goals of the procedure, and any potential problems that might occur during the procedure or recuperation. Informed consent has been obtained.  Description of procedure: The patient was met in the preoperative area. All risks, benefits, and indications of the procedure were described in great detail. The patient consented to the procedure. Preoperative antibiotics were given. The patient was taken to the operative theater. General anesthesia was induced per the anesthesia service. The patient was then placed in the dorsal lithotomy position and prepped and draped in the usual sterile fashion. A preoperative timeout was called.   A 21 French 30 cystoscope was inserted into the patient's bladder per urethra atraumatically.  Pan cystoscopy was remarkable only for significant intravesical protrusion of the prostate. I was unable to localize the ureteral orifices due to this prostatic protrusion. Attempts were made with both rigid and flexible cystoscope for approximately 45 minutes. Since ureteral orifice was unable to visualize, the procedure was aborted. A 20 French Foley catheter was placed dependent drainage. The patient was woken from anesthesia and transferred in stable condition post care unit.  Plan: The patient will follow-up in one month. He will obtain a CT urogram prior. Hopefully, the filling defect previously seen is no longer present. If it is present, we may need to perform direct visualization via an antegrade approach. Once this filling defect has been resolved, the patient will need a simple prostatectomy. He'll continue his Foley catheter until that time.  Baruch Gouty, M.D.

## 2015-08-23 LAB — CULTURE, URINE COMPREHENSIVE

## 2015-09-03 ENCOUNTER — Ambulatory Visit
Admission: RE | Admit: 2015-09-03 | Discharge: 2015-09-03 | Disposition: A | Discharge status: Home / Self Care | Payer: Medicare Other | Source: Ambulatory Visit | Attending: Urology | Admitting: Urology

## 2015-09-03 DIAGNOSIS — R3129 Other microscopic hematuria: Secondary | ICD-10-CM | POA: Diagnosis present

## 2015-09-03 DIAGNOSIS — M881 Osteitis deformans of vertebrae: Secondary | ICD-10-CM | POA: Diagnosis not present

## 2015-09-03 DIAGNOSIS — N329 Bladder disorder, unspecified: Secondary | ICD-10-CM | POA: Insufficient documentation

## 2015-09-03 DIAGNOSIS — M47814 Spondylosis without myelopathy or radiculopathy, thoracic region: Secondary | ICD-10-CM | POA: Insufficient documentation

## 2015-09-03 DIAGNOSIS — N289 Disorder of kidney and ureter, unspecified: Secondary | ICD-10-CM | POA: Diagnosis not present

## 2015-09-03 DIAGNOSIS — N2889 Other specified disorders of kidney and ureter: Secondary | ICD-10-CM

## 2015-09-03 DIAGNOSIS — N400 Enlarged prostate without lower urinary tract symptoms: Secondary | ICD-10-CM | POA: Insufficient documentation

## 2015-09-03 LAB — POCT I-STAT CREATININE: Creatinine, Ser: 1.2 mg/dL (ref 0.61–1.24)

## 2015-09-03 MED ORDER — IOPAMIDOL (ISOVUE-300) INJECTION 61%
125.0000 mL | Freq: Once | INTRAVENOUS | Status: AC | PRN
  Administered 2015-09-03: 125 mL via INTRAVENOUS

## 2015-09-20 ENCOUNTER — Ambulatory Visit (INDEPENDENT_AMBULATORY_CARE_PROVIDER_SITE_OTHER): Payer: Medicare Other | Admitting: Urology

## 2015-09-20 VITALS — BP 107/64 | HR 51 | Ht 68.0 in | Wt 174.0 lb

## 2015-09-20 DIAGNOSIS — R339 Retention of urine, unspecified: Secondary | ICD-10-CM

## 2015-09-20 DIAGNOSIS — R9341 Abnormal radiologic findings on diagnostic imaging of renal pelvis, ureter, or bladder: Secondary | ICD-10-CM

## 2015-09-20 DIAGNOSIS — N390 Urinary tract infection, site not specified: Secondary | ICD-10-CM | POA: Diagnosis not present

## 2015-09-20 DIAGNOSIS — R3129 Other microscopic hematuria: Secondary | ICD-10-CM

## 2015-09-20 DIAGNOSIS — N400 Enlarged prostate without lower urinary tract symptoms: Secondary | ICD-10-CM

## 2015-09-20 NOTE — Progress Notes (Signed)
09/20/2015 11:58 AM   Aaron Stokes 10/05/1940 [id number]  Referring provider: Ricke Hey, MD [place], [place] [address]  Chief Complaint  Patient presents with  . Follow-up    f/u diagnostic study, Ureteroscopy laser lithotripsy     HPI:    The patient is a 75 year old gentleman with massive BPH causing urinary retention, Foley dependent, right ureteral filling defect, and history of recurrent urinary tract infections prior to Foley placement who presents today for follow-up. He originally underwent cystoscopy for evaluation of his right ureteral filling defect to rule out a malignancy. However, the right ureter was unable to be accessed due to the massively enlarged prostate obstructing visualization of the ureteral orifice. He underwent a follow-up CT scan which showed persistence of the right ureteral filling defect. He follows up today for further discussion on the next step. The long-term goal is for him to undergo a simple prostatectomy once malignancy has been ruled out from his right distal ureter.   Previous UDS: Overall, there was an obstructive flow pattern. His maximum flow rate was 7 mL/s. His detrusor pressure at maximum flow was 50 cm of water. The maximum detrusor pressure was 53 cm water. His postvoid residual was 200 cc. His bladder excessive trabeculations. Maximum capacity was 400 cc  PMH: Past Medical History  Diagnosis Date  . Hypertension   . Anxiety   . Insomnia   . Acid reflux   . Chronic kidney disease     UTI; hematuria    Surgical History: Past Surgical History  Procedure Laterality Date  . No past surgeries    . Circumcision, non-newborn    . Cystoscopy with stent placement Right 08/21/2015    Procedure: cystoscopy;  Surgeon: Nickie Retort, MD;  Location: [facility] ORS;  Service: Urology;  Laterality: Right;    Home Medications:    Medication List       This list is accurate as of: 09/20/15 11:58 AM.  Always use your  most recent med list.               amLODipine 10 MG tablet  Commonly known as:  NORVASC  Take 10 mg by mouth at bedtime.     atenolol 50 MG tablet  Commonly known as:  TENORMIN  Take 50 mg by mouth at bedtime.     HYDROcodone-acetaminophen 5-325 MG tablet  Commonly known as:  NORCO  Take 1 tablet by mouth every 6 (six) hours as needed for moderate pain.     nystatin-triamcinolone ointment  Commonly known as:  MYCOLOG  Apply 1 application topically 2 (two) times daily. Pull back foreskin and apply to head of penis     perphenazine-amitriptyline 4-25 MG Tabs tablet  Commonly known as:  ETRAFON/TRIAVIL  Take 1 tablet by mouth daily.        Allergies: No Known Allergies  Family History: Family History  Problem Relation Age of Onset  . Prostate cancer Neg Hx   . Bladder Cancer Father   . Bladder Cancer Paternal Grandmother     Social History:  reports that he has never smoked. He has never used smokeless tobacco. He reports that he does not drink alcohol or use illicit drugs.  ROS: UROLOGY Frequent Urination?: No Hard to postpone urination?: No Burning/pain with urination?: No Get up at night to urinate?: No Leakage of urine?: No Urine stream starts and stops?: No Trouble starting stream?: No Do you have to strain to urinate?: No Blood in  urine?: No Urinary tract infection?: No Sexually transmitted disease?: No Injury to kidneys or bladder?: No Painful intercourse?: No Weak stream?: No Erection problems?: No Penile pain?: No  Gastrointestinal Nausea?: No Vomiting?: No Indigestion/heartburn?: No Diarrhea?: No Constipation?: No  Constitutional Fever: No Night sweats?: No Weight loss?: Yes Fatigue?: Yes  Skin Skin rash/lesions?: No Itching?: No  Eyes Blurred vision?: No Double vision?: No  Ears/Nose/Throat Sore throat?: No Sinus problems?: No  Hematologic/Lymphatic Swollen glands?: No Easy bruising?: No  Cardiovascular Leg swelling?:  No Chest pain?: No  Respiratory Cough?: No Shortness of breath?: No  Endocrine Excessive thirst?: No  Musculoskeletal Back pain?: No Joint pain?: No  Neurological Headaches?: No Dizziness?: No  Psychologic Depression?: No Anxiety?: No  Physical Exam: BP 107/64 mmHg  Pulse 51  Ht 5\' 8"  (1.727 m)  Wt 174 lb (78.926 kg)  BMI 26.46 kg/m2  Constitutional:  Alert and oriented, No acute distress. HEENT:  AT, moist mucus membranes.  Trachea midline, no masses. Cardiovascular: No clubbing, cyanosis, or edema. Respiratory: Normal respiratory effort, no increased work of breathing. GI: Abdomen is soft, nontender, nondistended, no abdominal masses GU: No CVA tenderness.  Skin: No rashes, bruises or suspicious lesions. Lymph: No cervical or inguinal adenopathy. Neurologic: Grossly intact, no focal deficits, moving all 4 extremities. Psychiatric: Normal mood and affect.  Laboratory Data: Lab Results  Component Value Date   WBC 6.1 05/15/2015   HGB 12.3* 05/15/2015   HCT 37.5* 05/15/2015   MCV 92.0 05/15/2015   PLT 217 05/15/2015    Lab Results  Component Value Date   CREATININE 1.20 09/03/2015    No results found for: PSA  No results found for: TESTOSTERONE  No results found for: HGBA1C  Urinalysis    Component Value Date/Time   COLORURINE YELLOW* 05/15/2015 0817   APPEARANCEUR Cloudy* 08/19/2015 1420   APPEARANCEUR HAZY* 05/15/2015 0817   LABSPEC 1.010 05/15/2015 0817   PHURINE 6.0 05/15/2015 0817   GLUCOSEU Negative 08/19/2015 1420   HGBUR 3+* 05/15/2015 0817   BILIRUBINUR Negative 08/19/2015 [place] 05/15/2015 0817   KETONESUR TRACE* 05/15/2015 0817   PROTEINUR 3+* 08/19/2015 1420   PROTEINUR 30* 05/15/2015 0817   NITRITE Positive* 08/19/2015 1420   NITRITE NEGATIVE 05/15/2015 0817   LEUKOCYTESUR 2+* 08/19/2015 1420   LEUKOCYTESUR NEGATIVE 05/15/2015 0817    Pertinent Imaging: CLINICAL DATA: Right ureteral filling defect  status post cystoscopy.  EXAM: CT ABDOMEN AND PELVIS WITHOUT AND WITH CONTRAST  TECHNIQUE: Multidetector CT imaging of the abdomen and pelvis was performed following the standard protocol before and following the bolus administration of intravenous contrast.  CONTRAST: 1106mL ISOVUE-300 IOPAMIDOL (ISOVUE-300) INJECTION 61%  COMPARISON: 07/09/2015  FINDINGS: Lower chest: No pleural effusion. The lung bases are clear.  Hepatobiliary: There is no focal liver abnormality. Stones are noted within the dependent portion of the gallbladder. These measure up to 7 mm, image 25 of series 4. No biliary dilatation identified.  Pancreas: Normal appearance of the pancreas.  Spleen: Negative  Adrenals/Urinary Tract: Normal appearance of the right adrenal gland. Low attenuation nodule in the left adrenal gland likely represents a benign adenoma measuring 1 cm. This is unchanged from previous exam. No kidney stones are noted. The kidneys are scratch set a few tiny low-attenuation foci are noted in both kidneys. These are too small to reliably characterize measuring up to 5 mm. The previously described segment of mild urothelial wall thickening involving the right ureter at the junction of the lumbar and pelvic  segments is again noted, images 41 through 43 of series 14. The appearance is unchanged from the previous exam. Diffuse bladder wall thickening noted. There is a Foley catheter identified within the urinary bladder. No bladder stones or mass noted.  Stomach/Bowel: The stomach an the small bowel loops are unremarkable. No pathologic dilatation of the bowel loops identified. There is a moderate stool burden noted throughout the colon up to the rectum. No pathologic dilatation of the colon.  Vascular/Lymphatic: Normal appearance of the abdominal aorta. No enlarged retroperitoneal or mesenteric adenopathy. No enlarged pelvic or inguinal lymph nodes.  Reproductive: Again  noted is massive prostatomegaly, unchanged. There is prominent mass effect on the bladder base by the markedly enlarged central lobe of the prostate.  Other: No free fluid or fluid collections identified.  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 There is mild multi level spondylosis within the thoracic spine. No aggressive lytic or sclerotic bone lesions identified.  IMPRESSION: 1. No kidney stones in no hydronephrosis. 2. Similar appearance of nonspecific mild urothelial wall thickening involving a short segment of the right ureter at the junction of the lumbar and pelvic segments of the right ureter. 3. Massive enlargement of the prostate gland. 4. Bladder wall thickening. 5. Other chronic changes as above.  Assessment & Plan:    The patient has a right ureteral filling defect that was unable to be assessed due to his massive prostate obstructing visualization of his ureteral orifice. We discussed possible ways to address this filling defects which include right antegrade ureteroscopy versus a HOLEP of his median lobe with a goal to provide visualization of his ureteral orifice. After discussing the risks and benefits of each, the patient has elected to undergo the holmium laser enucleation of his prostatic median lobe. He understands the goal of this procedure is to provide visualization not to cure his BPH with urinary retention though this may be a nice secondary outcome. He does understand that after this procedure that we will perform ureteroscopy to evaluate his distal ureteral filling defect. He understands that he still very well may need a simple prostatectomy and that this is not a safe option until malignancy in his right ureter has been ruled out. I will refer him to my partner, Dr. Erlene Quan, as she is experienced in the HOLEP procedure.  1. Urinary retention -continue foley for now. Will exchange today  2. BPH -Refer to Dr. Erlene Quan for [facility]  of median lobe. May still need simple prostatectomy in future  3. Recurrent UTIs- No UTI's since foley placed  4. Microscopic hematuria -negative workup outside of the right ureteral filling defect  5. Right ureteral filling defect Will address after HOLEP of prostate  Return in about 1 week (around 09/27/2015) for with Dr. Erlene Quan.  Nickie Retort, MD  [facility] [address] [place], [place] [address] [phone]

## 2015-09-27 ENCOUNTER — Ambulatory Visit (INDEPENDENT_AMBULATORY_CARE_PROVIDER_SITE_OTHER): Payer: Medicare Other | Admitting: Urology

## 2015-09-27 VITALS — BP 144/70 | HR 61 | Ht 68.0 in | Wt 173.0 lb

## 2015-09-27 DIAGNOSIS — R339 Retention of urine, unspecified: Secondary | ICD-10-CM | POA: Diagnosis not present

## 2015-09-27 DIAGNOSIS — R9341 Abnormal radiologic findings on diagnostic imaging of renal pelvis, ureter, or bladder: Secondary | ICD-10-CM

## 2015-09-27 DIAGNOSIS — N400 Enlarged prostate without lower urinary tract symptoms: Secondary | ICD-10-CM | POA: Diagnosis not present

## 2015-09-27 MED ORDER — FINASTERIDE 5 MG PO TABS: 5.0000 mg | ORAL_TABLET | Freq: Every day | ORAL | Status: DC

## 2015-09-27 NOTE — Progress Notes (Signed)
09/27/2015 2:46 PM   Aaron Stokes 02/22/1941 [id number]  Referring provider: Ricke Hey, MD [place], [place] [address]  Chief Complaint  Patient presents with  . Benign Prostatic Hypertrophy    1week discuss surgery    HPI: 75 year old male with massive BPH, urinary retention with indwelling Foley catheter, right ureteral filling defect, and history of recurrent urinary tract infections who presents today to discuss possible further management of his BPH.   He was seen and evaluated by Dr. Pilar Jarvis at which time options for further assessment of his ureteral filling defect were discussed. Antegrade ureteroscopy was discussed with Dr. Pilar Jarvis as he was unable to access the UO in a retrograde fashion but Aaron Stokes is not interested.  Alternatively, his median lobe could be addressed which may also help significantly with his urinary retention/massive BPH in order to allow for ureteral access thereafter.  Calculated volume of his prostate based on most recent CT scan was proximally 310 cc. This primarily consist of intravesical median lobe.  Aaron Stokes previously underwent further workup with urodynamics which revealed a maximum flow rate of 7 mL's per second with a maximum detrusor pressure at peak flow of 50 cm of water. His postvoid residual on that occasion was 200 cc. He did have a heavily trabeculated bladder with a maximum capacity 400 cc.    He currently has an indwelling Foley catheter was changed monthly.  He has had a long-standing history of urinary issues, primarily obstructive.  He is not currently on finasteride or Flomax.  He is accompanied today by his brother-in-law who is his primary caretaker.   PMH: Past Medical History  Diagnosis Date  . Hypertension   . Anxiety   . Insomnia   . Acid reflux   . Chronic kidney disease     UTI; hematuria    Surgical History: Past Surgical History  Procedure Laterality Date  . No past surgeries    .  Circumcision, non-newborn    . Cystoscopy with stent placement Right 08/21/2015    Procedure: cystoscopy;  Surgeon: Nickie Retort, MD;  Location: [facility] ORS;  Service: Urology;  Laterality: Right;    Home Medications:    Medication List       This list is accurate as of: 09/27/15 11:59 PM.  Always use your most recent med list.               amLODipine 10 MG tablet  Commonly known as:  NORVASC  Take 10 mg by mouth at bedtime.     atenolol 50 MG tablet  Commonly known as:  TENORMIN  Take 50 mg by mouth at bedtime.     finasteride 5 MG tablet  Commonly known as:  PROSCAR  Take 1 tablet (5 mg total) by mouth daily.     HYDROcodone-acetaminophen 5-325 MG tablet  Commonly known as:  NORCO  Take 1 tablet by mouth every 6 (six) hours as needed for moderate pain.     nystatin-triamcinolone ointment  Commonly known as:  MYCOLOG  Apply 1 application topically 2 (two) times daily. Pull back foreskin and apply to head of penis     perphenazine-amitriptyline 4-25 MG Tabs tablet  Commonly known as:  ETRAFON/TRIAVIL  Take 1 tablet by mouth daily.        Allergies: No Known Allergies  Family History: Family History  Problem Relation Age of Onset  . Prostate cancer Neg Hx   . Bladder Cancer Father   .  Bladder Cancer Paternal Grandmother     Social History:  reports that he has never smoked. He has never used smokeless tobacco. He reports that he does not drink alcohol or use illicit drugs.  ROS: UROLOGY Frequent Urination?: No Hard to postpone urination?: No Burning/pain with urination?: No Get up at night to urinate?: No Leakage of urine?: No Urine stream starts and stops?: No Trouble starting stream?: No Do you have to strain to urinate?: No Blood in urine?: No Urinary tract infection?: No Sexually transmitted disease?: No Injury to kidneys or bladder?: No Painful intercourse?: No Weak stream?: No Erection problems?: No Penile pain?:  No  Gastrointestinal Nausea?: No Vomiting?: No Indigestion/heartburn?: No Diarrhea?: No Constipation?: No  Constitutional Fever: No Night sweats?: No Weight loss?: Yes Fatigue?: Yes  Skin Skin rash/lesions?: No Itching?: No  Eyes Blurred vision?: No Double vision?: No  Ears/Nose/Throat Sore throat?: No Sinus problems?: No  Hematologic/Lymphatic Swollen glands?: No Easy bruising?: No  Cardiovascular Leg swelling?: No Chest pain?: No  Respiratory Cough?: No Shortness of breath?: No  Endocrine Excessive thirst?: No  Musculoskeletal Back pain?: No Joint pain?: No  Neurological Headaches?: No Dizziness?: No  Psychologic Depression?: No Anxiety?: No  Physical Exam: BP 144/70 mmHg  Pulse 61  Ht 5\' 8"  (1.727 m)  Wt 173 lb (78.472 kg)  BMI 26.31 kg/m2  Constitutional:  Alert and oriented, No acute distress. HEENT: [place] AT, moist mucus membranes.  Trachea midline, no masses. Cardiovascular: No clubbing, cyanosis, or edema. RRR. Respiratory: Normal respiratory effort, no increased work of breathing.  CTAB. GI: Abdomen is soft, nontender, nondistended, no abdominal masses GU: No CVA tenderness.  Foley in place.   Skin: No rashes, bruises or suspicious lesions. Neurologic: Grossly intact, no focal deficits, moving all 4 extremities. Psychiatric: Normal mood and affect.  Laboratory Data: Lab Results  Component Value Date   WBC 6.1 05/15/2015   HGB 12.3* 05/15/2015   HCT 37.5* 05/15/2015   MCV 92.0 05/15/2015   PLT 217 05/15/2015    Lab Results  Component Value Date   CREATININE 1.20 09/03/2015   Pertinent Imaging: CT urogram reviewed from 09/03/15  Assessment & Plan:    1. BPH (benign prostatic hyperplasia)  Various options for treating his enlarged prostate were discussed today in detail. These include robotic simple prostatectomy, open simple prostatectomy, and holmium laser enucleation of the prostate. Each of these interventions will allow  for restoration of his normal trigonal anatomy and ideally access into the ureter for further evaluation as a staged procedure. We discussed the risk and benefits of each.  He is interested in holmium laser enucleation of the prostate. Given the massive size of the prostate, I would focus primarily on the median lobe. We also discussed possible need for a small cystotomy in order to remove the median lobe if the bladder does not distend adequately for morcellation in order to avoid injury to this structure. In addition, we discussed the risk of the procedure primarily including bleeding, damage to surrounding structures including the bladder, worsening of his irritative voiding symptoms, stress and urge incontinence, failure to resolve his urinary retention in addition to risk of general anesthesia.  Given the massive size of his gland, I will plan to admit him overnight for observation and CBI. In addition, we will check a UA/urine culture and likely start antibiotics prior to the procedure pending the result.     Finally, he has not had any recent PSA values. I do anticipate that this will  be quite elevated due to the massive size of his prostate as well as retention and indwelling Foley catheter. I would like to rule out an excessively high PSA and will check at the time of preoperative labs.  2. Urinary retention As above  3. Ureter filling defect Plan for ureteroscopy following treatment of his median lobe.  Discussion today with the patient and his brother-in-law was lengthy. All their questions were answered in detail.  Hollice Espy, MD  [facility] [address] [place], [place] [address] [phone]  I spent 25 min with this patient of which greater than 50% was spent in counseling and coordination of care with the patient.

## 2015-09-28 ENCOUNTER — Encounter: Payer: Self-pay | Admitting: Urology

## 2015-09-30 ENCOUNTER — Telehealth: Payer: Self-pay | Admitting: Radiology

## 2015-09-30 NOTE — Telephone Encounter (Signed)
Notified pt's brother-in-law, Marlana Latus, of surgery scheduled 10/23/15, nurse visit at [facility] on 10/09/15 @10 :30, pre-admit testing appt on 10/09/15 @11 :45 & to call Monday 10/21/15 for arrival time to [facility]. Mr. Gelene Mink voices understanding. Surgery & Pre-Admit Testing Information sheet mailed to Mr. Gelene Mink.

## 2015-10-09 ENCOUNTER — Ambulatory Visit: Payer: Medicare Other

## 2015-10-09 ENCOUNTER — Encounter
Admission: RE | Admit: 2015-10-09 | Discharge: 2015-10-09 | Disposition: A | Discharge status: Home / Self Care | Payer: Medicare Other | Source: Ambulatory Visit | Attending: Urology | Admitting: Urology

## 2015-10-09 DIAGNOSIS — Z01812 Encounter for preprocedural laboratory examination: Secondary | ICD-10-CM | POA: Insufficient documentation

## 2015-10-09 DIAGNOSIS — R339 Retention of urine, unspecified: Secondary | ICD-10-CM

## 2015-10-09 LAB — TYPE AND SCREEN
ABO/RH(D): B POS
Antibody Screen: NEGATIVE

## 2015-10-09 LAB — URINALYSIS COMPLETE WITH MICROSCOPIC (ARMC ONLY)
Bilirubin Urine: NEGATIVE
Glucose, UA: NEGATIVE mg/dL
Ketones, ur: NEGATIVE mg/dL
Nitrite: POSITIVE — AB
Protein, ur: >500 mg/dL — AB
Specific Gravity, Urine: 1.018 (ref 1.005–1.030)
pH: 5 (ref 5.0–8.0)

## 2015-10-09 LAB — BASIC METABOLIC PANEL
Anion gap: 6 (ref 5–15)
BUN: 13 mg/dL (ref 6–20)
CO2: 29 mmol/L (ref 22–32)
Calcium: 10.1 mg/dL (ref 8.9–10.3)
Chloride: 104 mmol/L (ref 101–111)
Creatinine, Ser: 1.04 mg/dL (ref 0.61–1.24)
GFR calc Af Amer: >60 mL/min (ref >60)
GFR calc non Af Amer: >60 mL/min (ref >60)
Glucose, Bld: 95 mg/dL (ref 65–99)
Potassium: 4.1 mmol/L (ref 3.5–5.1)
Sodium: 139 mmol/L (ref 135–145)

## 2015-10-09 LAB — CBC
HCT: 34.5 % — ABNORMAL LOW (ref 40.0–52.0)
Hemoglobin: 11.7 g/dL — ABNORMAL LOW (ref 13.0–18.0)
MCH: 31.1 pg (ref 26.0–34.0)
MCHC: 34 g/dL (ref 32.0–36.0)
MCV: 91.4 fL (ref 80.0–100.0)
Platelets: 174 10*3/uL (ref 150–440)
RBC: 3.78 MIL/uL — ABNORMAL LOW (ref 4.40–5.90)
RDW: 14.1 % (ref 11.5–14.5)
WBC: 5.2 10*3/uL (ref 3.8–10.6)

## 2015-10-09 LAB — PROTIME-INR
INR: 1.14
Prothrombin Time: 14.8 seconds (ref 11.4–15.0)

## 2015-10-09 LAB — PSA: PSA: 19.09 ng/mL — ABNORMAL HIGH (ref 0.00–4.00)

## 2015-10-09 LAB — APTT: aPTT: 33 seconds (ref 24–36)

## 2015-10-09 NOTE — Patient Instructions (Signed)
  Your procedure is scheduled on: October 23, 2015 (Wednesday) Report to Day Surgery. [facility] SECOND FLOOR To find out your arrival time please call [phone] between 1PM - 3PM on October 21, 2015 (Monday).  Remember: Instructions that are not followed completely may result in serious medical risk, up to and including death, or upon the discretion of your surgeon and anesthesiologist your surgery may need to be rescheduled.    __x__ 1. Do not eat food or drink liquids after midnight. No gum chewing or hard candies.     __x__ 2. No Alcohol for 24 hours before or after surgery.   __x__ 3. Do Not Smoke For 24 Hours Prior to Your Surgery.   ____ 4. Bring all medications with you on the day of surgery if instructed.    __x__ 5. Notify your doctor if there is any change in your medical condition     (cold, fever, infections).       Do not wear jewelry, make-up, hairpins, clips or nail polish.  Do not wear lotions, powders, or perfumes. You may wear deodorant.  Do not shave 48 hours prior to surgery. Men may shave face and neck.  Do not bring valuables to the hospital.    [facility] is not responsible for any belongings or valuables.               Contacts, dentures or bridgework may not be worn into surgery.  Leave your suitcase in the car. After surgery it may be brought to your room.  For patients admitted to the hospital, discharge time is determined by your                treatment team.   Patients discharged the day of surgery will not be allowed to drive home.   Please read over the following fact sheets that you were given:   Surgical Site Infection Prevention   ____ Take these medicines the morning of surgery with A SIP OF WATER:    1.   2.   3.   4.  5.  6.  ____ Fleet Enema (as directed)   ____ Use CHG Soap as directed  ____ Use inhalers on the day of surgery  ____ Stop metformin 2 days prior to surgery    ____ Take 1/2 of usual insulin dose the night before  surgery and none on the morning of surgery.   _x___ Stop Coumadin/Plavix/aspirin on (N/A)  _x_ Stop Anti-inflammatories on (NO NSAIDS) Tylenol ok to take for pain if needed   ____ Stop supplements until after surgery.    ____ Bring C-Pap to the hospital.

## 2015-10-09 NOTE — Progress Notes (Signed)
Pt came in today for a cath specimen prior to surgery. Pt cath was plugged for 50min without sufficient amount of urine return. Pt went to pre-admit where they were able to collect enough urine. Test were performed at pre-admit.

## 2015-10-11 ENCOUNTER — Telehealth: Payer: Self-pay

## 2015-10-11 DIAGNOSIS — N390 Urinary tract infection, site not specified: Secondary | ICD-10-CM

## 2015-10-11 MED ORDER — SULFAMETHOXAZOLE-TRIMETHOPRIM 800-160 MG PO TABS: 1.0000 | ORAL_TABLET | Freq: Two times a day (BID) | ORAL | Status: AC

## 2015-10-11 NOTE — Telephone Encounter (Signed)
Spoke with pt caregiver, Jenny Reichmann, in reference to +ucx. Made aware medication needs to be started 7 days prior to surgery. Aaron Stokes voiced understanding.

## 2015-10-11 NOTE — Pre-Procedure Instructions (Signed)
Urine culture results faxed to Dr. Erlene Quan office.

## 2015-10-11 NOTE — Telephone Encounter (Signed)
-----   Message from Hollice Espy, MD sent at 10/11/2015 12:16 PM EDT ----- Please treat with bactrim DS bid started 7 days prior to surgery, give 10 day suppy  Hollice Espy, MD

## 2015-10-12 LAB — URINE CULTURE: Culture: >=100000 — AB

## 2015-10-14 ENCOUNTER — Telehealth: Payer: Self-pay

## 2015-10-14 NOTE — Telephone Encounter (Signed)
-----   Message from Hollice Espy, MD sent at 10/12/2015 12:22 PM EDT ----- Actually, can you change it to Levaquin 500 mg po daily x 10 days, start 7 days prior to surgery .  Urine also growing enterococcus.    Hollice Espy, MD

## 2015-10-14 NOTE — Telephone Encounter (Signed)
Medication changed. LMOM- for Jenny Reichmann

## 2015-10-15 NOTE — Telephone Encounter (Signed)
Spoke with pt brother-in law, [name], in reference to changing medication. [name] voiced understanding.

## 2015-10-23 ENCOUNTER — Observation Stay
Admission: RE | Admit: 2015-10-23 | Discharge: 2015-10-24 | Disposition: A | Discharge status: Home / Self Care | Payer: Medicare Other | Source: Ambulatory Visit | Attending: Urology | Admitting: Urology

## 2015-10-23 ENCOUNTER — Encounter: Payer: Self-pay | Admitting: *Deleted

## 2015-10-23 ENCOUNTER — Inpatient Hospital Stay: Payer: Medicare Other | Admitting: Anesthesiology

## 2015-10-23 ENCOUNTER — Encounter
Admission: RE | Disposition: A | Discharge status: Home / Self Care | Payer: Self-pay | Source: Ambulatory Visit | Attending: Urology

## 2015-10-23 DIAGNOSIS — F419 Anxiety disorder, unspecified: Secondary | ICD-10-CM | POA: Insufficient documentation

## 2015-10-23 DIAGNOSIS — N401 Enlarged prostate with lower urinary tract symptoms: Principal | ICD-10-CM | POA: Diagnosis present

## 2015-10-23 DIAGNOSIS — Z8052 Family history of malignant neoplasm of bladder: Secondary | ICD-10-CM | POA: Diagnosis not present

## 2015-10-23 DIAGNOSIS — N138 Other obstructive and reflux uropathy: Secondary | ICD-10-CM | POA: Insufficient documentation

## 2015-10-23 DIAGNOSIS — K219 Gastro-esophageal reflux disease without esophagitis: Secondary | ICD-10-CM | POA: Insufficient documentation

## 2015-10-23 DIAGNOSIS — G4700 Insomnia, unspecified: Secondary | ICD-10-CM | POA: Insufficient documentation

## 2015-10-23 DIAGNOSIS — Z9889 Other specified postprocedural states: Secondary | ICD-10-CM | POA: Insufficient documentation

## 2015-10-23 DIAGNOSIS — N2889 Other specified disorders of kidney and ureter: Secondary | ICD-10-CM | POA: Diagnosis not present

## 2015-10-23 DIAGNOSIS — I10 Essential (primary) hypertension: Secondary | ICD-10-CM | POA: Diagnosis not present

## 2015-10-23 DIAGNOSIS — Z79899 Other long term (current) drug therapy: Secondary | ICD-10-CM | POA: Insufficient documentation

## 2015-10-23 DIAGNOSIS — R338 Other retention of urine: Secondary | ICD-10-CM

## 2015-10-23 HISTORY — PX: HOLEP-LASER ENUCLEATION OF THE PROSTATE WITH MORCELLATION: SHX6641

## 2015-10-23 SURGERY — ENUCLEATION, PROSTATE, USING LASER, WITH MORCELLATION
Anesthesia: General | Wound class: Clean Contaminated

## 2015-10-23 MED ORDER — FAMOTIDINE 20 MG PO TABS
20.0000 mg | ORAL_TABLET | Freq: Once | ORAL | Status: AC
  Administered 2015-10-23: 20 mg via ORAL

## 2015-10-23 MED ORDER — FAMOTIDINE 20 MG PO TABS
ORAL_TABLET | ORAL | Status: AC
  Administered 2015-10-23: 20 mg via ORAL
  Filled 2015-10-23: qty 1

## 2015-10-23 MED ORDER — PERPHENAZINE 4 MG PO TABS
4.0000 mg | ORAL_TABLET | Freq: Every day | ORAL | Status: DC
  Administered 2015-10-23: 4 mg via ORAL
  Filled 2015-10-23: qty 1

## 2015-10-23 MED ORDER — OXYCODONE-ACETAMINOPHEN 5-325 MG PO TABS: 1.0000 | ORAL_TABLET | ORAL | Status: DC | PRN

## 2015-10-23 MED ORDER — LEVOFLOXACIN 500 MG PO TABS
500.0000 mg | ORAL_TABLET | Freq: Every day | ORAL | Status: DC
Start: 2015-10-24 — End: 2015-10-24
  Administered 2015-10-24: 500 mg via ORAL
  Filled 2015-10-23: qty 1

## 2015-10-23 MED ORDER — AMLODIPINE BESYLATE 10 MG PO TABS: 10.0000 mg | ORAL_TABLET | Freq: Every day | ORAL | Status: DC

## 2015-10-23 MED ORDER — DOCUSATE SODIUM 100 MG PO CAPS: 100.0000 mg | ORAL_CAPSULE | Freq: Two times a day (BID) | ORAL | Status: DC

## 2015-10-23 MED ORDER — AMITRIPTYLINE HCL 25 MG PO TABS
25.0000 mg | ORAL_TABLET | Freq: Every day | ORAL | Status: DC
  Administered 2015-10-23: 25 mg via ORAL
  Filled 2015-10-23: qty 1

## 2015-10-23 MED ORDER — ROCURONIUM BROMIDE 100 MG/10ML IV SOLN
INTRAVENOUS | Status: DC | PRN
Start: 2015-10-23 — End: 2015-10-23
  Administered 2015-10-23: 50 mg via INTRAVENOUS
  Administered 2015-10-23: 10 mg via INTRAVENOUS
  Administered 2015-10-23: 15 mg via INTRAVENOUS

## 2015-10-23 MED ORDER — FINASTERIDE 5 MG PO TABS
5.0000 mg | ORAL_TABLET | Freq: Every day | ORAL | Status: DC
Start: 2015-10-23 — End: 2015-10-24
  Administered 2015-10-23 – 2015-10-24 (×2): 5 mg via ORAL
  Filled 2015-10-23 (×2): qty 1

## 2015-10-23 MED ORDER — FUROSEMIDE 10 MG/ML IJ SOLN
INTRAMUSCULAR | Status: DC | PRN
  Administered 2015-10-23: 10 mg via INTRAVENOUS

## 2015-10-23 MED ORDER — DOCUSATE SODIUM 100 MG PO CAPS
100.0000 mg | ORAL_CAPSULE | Freq: Two times a day (BID) | ORAL | Status: DC
  Administered 2015-10-23 – 2015-10-24 (×2): 100 mg via ORAL
  Filled 2015-10-23 (×2): qty 1

## 2015-10-23 MED ORDER — PHENYLEPHRINE HCL 10 MG/ML IJ SOLN
INTRAMUSCULAR | Status: DC | PRN
  Administered 2015-10-23 (×8): 100 ug via INTRAVENOUS

## 2015-10-23 MED ORDER — DIPHENHYDRAMINE HCL 12.5 MG/5ML PO ELIX: 12.5000 mg | ORAL_SOLUTION | Freq: Four times a day (QID) | ORAL | Status: DC | PRN

## 2015-10-23 MED ORDER — SODIUM CHLORIDE 0.9 % IV SOLN
INTRAVENOUS | Status: DC
  Administered 2015-10-23: 07:00:00 via INTRAVENOUS

## 2015-10-23 MED ORDER — ATENOLOL 25 MG PO TABS
50.0000 mg | ORAL_TABLET | Freq: Every day | ORAL | Status: DC
  Administered 2015-10-23: 50 mg via ORAL
  Filled 2015-10-23: qty 2

## 2015-10-23 MED ORDER — ONDANSETRON HCL 4 MG/2ML IJ SOLN: 4.0000 mg | Freq: Once | INTRAMUSCULAR | Status: DC | PRN

## 2015-10-23 MED ORDER — GLYCOPYRROLATE 0.2 MG/ML IJ SOLN
INTRAMUSCULAR | Status: DC | PRN
  Administered 2015-10-23 (×2): 0.2 mg via INTRAVENOUS

## 2015-10-23 MED ORDER — FENTANYL CITRATE (PF) 100 MCG/2ML IJ SOLN: 25.0000 ug | INTRAMUSCULAR | Status: DC | PRN

## 2015-10-23 MED ORDER — SODIUM CHLORIDE 0.9 % IV SOLN
INTRAVENOUS | Status: DC
  Administered 2015-10-23: 16:00:00 via INTRAVENOUS
  Administered 2015-10-24: 1000 mL via INTRAVENOUS
  Administered 2015-10-24: 01:00:00 via INTRAVENOUS

## 2015-10-23 MED ORDER — BELLADONNA ALKALOIDS-OPIUM 16.2-60 MG RE SUPP: 1.0000 | Freq: Four times a day (QID) | RECTAL | Status: DC | PRN

## 2015-10-23 MED ORDER — OXYBUTYNIN CHLORIDE 5 MG PO TABS: 5.0000 mg | ORAL_TABLET | Freq: Three times a day (TID) | ORAL | Status: DC | PRN

## 2015-10-23 MED ORDER — MORPHINE SULFATE (PF) 2 MG/ML IV SOLN: 2.0000 mg | INTRAVENOUS | Status: DC | PRN

## 2015-10-23 MED ORDER — ONDANSETRON HCL 4 MG/2ML IJ SOLN
4.0000 mg | INTRAMUSCULAR | Status: DC | PRN
  Administered 2015-10-24: 4 mg via INTRAVENOUS
  Filled 2015-10-23: qty 2

## 2015-10-23 MED ORDER — ACETAMINOPHEN 325 MG PO TABS: 650.0000 mg | ORAL_TABLET | ORAL | Status: DC | PRN

## 2015-10-23 MED ORDER — GENTAMICIN IN SALINE 1.6-0.9 MG/ML-% IV SOLN
80.0000 mg | Freq: Once | INTRAVENOUS | Status: AC
  Administered 2015-10-23: 80 mg via INTRAVENOUS
  Filled 2015-10-23: qty 50

## 2015-10-23 MED ORDER — PERPHENAZINE-AMITRIPTYLINE 4-25 MG PO TABS: 1.0000 | ORAL_TABLET | Freq: Every day | ORAL | Status: DC

## 2015-10-23 MED ORDER — SUGAMMADEX SODIUM 200 MG/2ML IV SOLN
INTRAVENOUS | Status: DC | PRN
  Administered 2015-10-23: 160 mg via INTRAVENOUS

## 2015-10-23 MED ORDER — FENTANYL CITRATE (PF) 100 MCG/2ML IJ SOLN
INTRAMUSCULAR | Status: DC | PRN
  Administered 2015-10-23 (×2): 50 ug via INTRAVENOUS

## 2015-10-23 MED ORDER — EPHEDRINE SULFATE 50 MG/ML IJ SOLN
INTRAMUSCULAR | Status: DC | PRN
  Administered 2015-10-23: 10 mg via INTRAVENOUS
  Administered 2015-10-23: 20 mg via INTRAVENOUS
  Administered 2015-10-23 (×6): 10 mg via INTRAVENOUS

## 2015-10-23 MED ORDER — DIPHENHYDRAMINE HCL 50 MG/ML IJ SOLN: 12.5000 mg | Freq: Four times a day (QID) | INTRAMUSCULAR | Status: DC | PRN

## 2015-10-23 MED ORDER — LIDOCAINE HCL (CARDIAC) 20 MG/ML IV SOLN
INTRAVENOUS | Status: DC | PRN
  Administered 2015-10-23: 100 mg via INTRATRACHEAL

## 2015-10-23 MED ORDER — ONDANSETRON HCL 4 MG/2ML IJ SOLN
INTRAMUSCULAR | Status: DC | PRN
  Administered 2015-10-23: 4 mg via INTRAVENOUS

## 2015-10-23 MED ORDER — DEXAMETHASONE SODIUM PHOSPHATE 10 MG/ML IJ SOLN
INTRAMUSCULAR | Status: DC | PRN
  Administered 2015-10-23: 4 mg via INTRAVENOUS

## 2015-10-23 MED ORDER — HEPARIN SODIUM (PORCINE) 5000 UNIT/ML IJ SOLN
5000.0000 [IU] | Freq: Three times a day (TID) | INTRAMUSCULAR | Status: DC
  Administered 2015-10-23 – 2015-10-24 (×2): 5000 [IU] via SUBCUTANEOUS
  Filled 2015-10-23 (×2): qty 1

## 2015-10-23 MED ORDER — PROPOFOL 10 MG/ML IV BOLUS
INTRAVENOUS | Status: DC | PRN
  Administered 2015-10-23: 200 mg via INTRAVENOUS

## 2015-10-23 MED ORDER — LACTATED RINGERS IV SOLN
INTRAVENOUS | Status: DC | PRN
  Administered 2015-10-23 (×2): via INTRAVENOUS

## 2015-10-23 MED ORDER — CEFTRIAXONE SODIUM 1 G IJ SOLR
1.0000 g | Freq: Once | INTRAMUSCULAR | Status: AC
  Administered 2015-10-23 (×2): 1 g via INTRAVENOUS
  Filled 2015-10-23: qty 10

## 2015-10-23 SURGICAL SUPPLY — 50 items
ADAPTER IRRIG TUBE 2 SPIKE SOL (ADAPTER) ×4 IMPLANT
BAG DRAIN CYSTO-URO LG1000N (MISCELLANEOUS) ×2 IMPLANT
BAG URO DRAIN 2000ML W/SPOUT (MISCELLANEOUS) ×2 IMPLANT
BAG URO DRAIN 4000ML (MISCELLANEOUS) ×4 IMPLANT
BLADE SURG 15 STRL LF DISP TIS (BLADE) IMPLANT
BLADE SURG 15 STRL SS (BLADE)
CATH FOL 2WAY LX 20X30 (CATHETERS) IMPLANT
CATH FOL 3WAY LX COUV 24X75 (CATHETERS) ×2 IMPLANT
CATH FOL LEG HOLDER (MISCELLANEOUS) ×2 IMPLANT
CATH FOLEY 2WAY  5CC 16FR (CATHETERS)
CATH FOLEY 3WAY 30CC 22FR (CATHETERS) IMPLANT
CATH URETL 5X70 OPEN END (CATHETERS) ×2 IMPLANT
CATH URTH 16FR FL 2W BLN LF (CATHETERS) IMPLANT
CNTNR SPEC 2.5X3XGRAD LEK (MISCELLANEOUS)
CONT SPEC 4OZ STER OR WHT (MISCELLANEOUS)
CONTAINER COLLECT MORCELLATR (MISCELLANEOUS) ×3 IMPLANT
CONTAINER SPEC 2.5X3XGRAD LEK (MISCELLANEOUS) IMPLANT
DRAIN PENROSE 5/8X12 LTX STRL (DRAIN) IMPLANT
DRAPE SHEET LG 3/4 BI-LAMINATE (DRAPES) ×2 IMPLANT
DRAPE UTILITY 15X26 TOWEL STRL (DRAPES) ×2 IMPLANT
FILTER OVERFLOW MORCELLATOR (FILTER) ×1 IMPLANT
GLOVE BIO SURGEON STRL SZ 6.5 (GLOVE) ×2 IMPLANT
GLOVE BIO SURGEON STRL SZ7 (GLOVE) ×2 IMPLANT
GOWN STRL REUS W/ TWL LRG LVL3 (GOWN DISPOSABLE) ×2 IMPLANT
GOWN STRL REUS W/TWL LRG LVL3 (GOWN DISPOSABLE) ×2
HOLMIUM LASER 550 FIBER (Laser) ×2 IMPLANT
KIT RM TURNOVER CYSTO AR (KITS) ×2 IMPLANT
MORCELLATOR COLLECT CONTAINER (MISCELLANEOUS) ×6
MORCELLATOR OVERFLOW FILTER (FILTER) ×2
MORCELLATOR ROTATION 4.75 335 (MISCELLANEOUS) ×2 IMPLANT
NEEDLE HYPO 25X1 1.5 SAFETY (NEEDLE) IMPLANT
NEEDLE SPNL 18GX3.5 QUINCKE PK (NEEDLE) IMPLANT
PACK CYSTO AR (MISCELLANEOUS) ×2 IMPLANT
PAD PREP 24X41 OB/GYN DISP (PERSONAL CARE ITEMS) IMPLANT
PREP PVP WINGED SPONGE (MISCELLANEOUS) IMPLANT
PUMP SINGLE ACTION SAP (PUMP) IMPLANT
SENSORWIRE 0.038 NOT ANGLED (WIRE) ×4
SET CYSTO W/LG BORE CLAMP LF (SET/KITS/TRAYS/PACK) ×4 IMPLANT
SET IRRIG Y TYPE TUR BLADDER L (SET/KITS/TRAYS/PACK) ×2 IMPLANT
SOL .9 NS 3000ML IRR  AL (IV SOLUTION) ×38
SOL .9 NS 3000ML IRR UROMATIC (IV SOLUTION) ×38 IMPLANT
SUT CHROMIC 2 0 SH (SUTURE) IMPLANT
SUT SILK 0 SH 30 (SUTURE) IMPLANT
SUT VIC AB 0 SH 27 (SUTURE) IMPLANT
SYRINGE 10CC LL (SYRINGE) ×2 IMPLANT
SYRINGE IRR TOOMEY STRL 70CC (SYRINGE) ×2 IMPLANT
TUBE PUMP MORCELLATOR PIRANHA (TUBING) ×2 IMPLANT
TUBING CONNECTING 10 (TUBING) ×2 IMPLANT
WATER STERILE IRR 1000ML POUR (IV SOLUTION) ×2 IMPLANT
WIRE SENSOR 0.038 NOT ANGLED (WIRE) ×2 IMPLANT

## 2015-10-23 NOTE — Op Note (Signed)
Date of procedure: 10/23/2015  Preoperative diagnosis:  1. BPH with urinary obstruction/ retention 2.  Right ureteral filling defect  Postoperative diagnosis:  1. Same as above   Procedure: 1. Holmium laser enucleation of the prostate with morcellation  Surgeon: Vanna Scotland, MD  Anesthesia: General  Complications: None  Intraoperative findings: Massive BPH with hugely enlarged median lobe.  EBL: 200 cc  Specimens: Prostate chips  Drains: 24 French three-way hematuria catheter  Indication: Aaron Stokes is a 75 y.o. patient with massive BPH and urinary retention, at least 310 cc gland.  Due to his obstructing median lobe, he is unable to proceed with right ureteroscopy for further evaluation of his right ureteral filling defect.  After reviewing the management options for treatment, he elected to proceed with the above surgical procedure(s). We have discussed the potential benefits and risks of the procedure, side effects of the proposed treatment, the likelihood of the patient achieving the goals of the procedure, and any potential problems that might occur during the procedure or recuperation. Informed consent has been obtained.  Description of procedure:  The patient was taken to the operating room and general anesthesia was induced.  The patient was placed in the dorsal lithotomy position, prepped and draped in the usual sterile fashion, and preoperative antibiotics were administered. A preoperative time-out was performed.   This point in time, a 71 French resectoscope was advanced per urethra into the bladder using a blunt obturator without difficulty. The laser bridge was used along with a 550  laser fiber. Careful inspection of the bladder revealed a moderately trabeculated bladder with normal mucosa and no obvious lesions. The prostate was massively enlarged with extreme trilobar coaptation massive median lobe distorting the bladder neck. The trigone was not easily visible  due to this large obstructing gland. The prostatic length was at least 8 cm. At this point time, 2 incisions were created at the 5:00 and 7:00 positions within the trough distinguishing the lateral lobes from the median lobe.  Laser settings were 2 J and a proximal leg 50 Hz.  These incisions were carried down from the bladder neck and met in the midline just proximal to the inferior. A plane was then created between the adenoma and the prostatic capsule and the median lobe was enucleated in a caudal to cranial fashion rolling the lobe towards the bladder neck and ultimately amputating the adenoma from the bladder neck by incising the mucosa at this level. This freed up the median lobe which is able to be pushed into the bladder. Initially, we discussed only treating the median lobe but since enucleation of this lobe was very straightforward and there was still significant bilobar coaptation from the lateral lobes, did elect to go ahead and treat the left lateral lobe. A semilunar incision was created at the left prostatic apex again with care taken to avoid any resection beyond the vera. This incision was deepened until a nice plane between the capsule and the adenoma was created. Within this plane, there were a few prostate stones appreciated. The incision was carried out in a circumferential fashion up towards the anterior bladder neck. The lobe was rolled towards the bladder neck until was ultimately able to be imitated from the mucosa and pushed into the bladder itself. Hemostasis was achieved using modified settings of 1.5 J and 30 Hz.  Inspection of the trigone revealed a normal trigonal ridge in the right UO could be easily seen. The left UO was somewhat distorted although felt to  be uninjured. At this point in time, the bladder was noted to be relatively filled with adenoma. Visualization although poor did appear to be sufficient for morcellation. The resectoscope was then exchanged for a nephroscope using  the same outer sheath and a [place] morcellator was used to [employer] the gland. This was performed over at least a 1.5 hour period in time as there was massive amounts of tissues to morcellate. Care was taken to avoid any injury to the bladder and that the bladder remained adequately distended throughout the morcellation.  At this point in time, it appeared that the majority of prostate chips were enucleated although visualization was somewhat poor. The bladder was evacuated several times. Some larger prostate chips are irrigated out of the bladder using a [place] syringe as well as rigid graspers. Finally, the scope was removed and a 24 [place] three-way hematuria catheter was placed over a catheter guide.. The balloon was filled with 60 cc of sterile water. The patient was started on a moderate drip CBI which remained adequately clear.  Patient was then repositioned in the supine position, reversed from anesthesia, taken to the PACU in stable condition.  Plan: Patient will remain overnight first CBI which will hopefully be stopped in the morning. We will send him home with a catheter for at least a week prior to voiding trial.  Hollice Espy, M.D.

## 2015-10-23 NOTE — Interval H&P Note (Signed)
History and Physical Interval Note:  10/23/2015 7:13 AM  [name]  has presented today for surgery, with the diagnosis of bph,urinary retention  The various methods of treatment have been discussed with the patient and family. After consideration of risks, benefits and other options for treatment, the patient has consented to  Procedure(s): [place] WITH MORCELLATION (N/A) INSERTION OF SUPRAPUBIC CATHETER (N/A) as a surgical intervention .  The patient's history has been reviewed, patient examined, no change in status, stable for surgery.  I have reviewed the patient's chart and labs.  Questions were answered to the patient's satisfaction.    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

## 2015-10-23 NOTE — Anesthesia Procedure Notes (Signed)
Procedure Name: Intubation Date/Time: 10/23/2015 7:52 AM Performed by: Timoteo Expose Pre-anesthesia Checklist: Timeout performed, Patient being monitored, Suction available, Emergency Drugs available and Patient identified Patient Re-evaluated:Patient Re-evaluated prior to inductionOxygen Delivery Method: Circle system utilized Preoxygenation: Pre-oxygenation with 100% oxygen Intubation Type: IV induction, Inhalational induction, Inhalational induction with existing ETT and Combination inhalational/ intravenous induction Ventilation: Mask ventilation without difficulty Laryngoscope Size: Mac and 4 Grade View: Grade I Tube type: Oral Tube size: 7.5 mm Number of attempts: 1 Placement Confirmation: ETT inserted through vocal cords under direct vision,  positive ETCO2,  CO2 detector and breath sounds checked- equal and bilateral Secured at: 23 cm Tube secured with: Tape

## 2015-10-23 NOTE — Transfer of Care (Signed)
Immediate Anesthesia Transfer of Care Note  Patient: Aaron Stokes  Procedure(s) Performed: Procedure(s): [employer] ENUCLEATION OF THE PROSTATE WITH MORCELLATION (N/A)  Patient Location: PACU  Anesthesia Type:General  Level of Consciousness: awake and patient cooperative  Airway & Oxygen Therapy: Patient Spontanous Breathing and Patient connected to face mask oxygen  Post-op Assessment: Report given to RN  Post vital signs: Reviewed and stable  Last Vitals:  Filed Vitals:   10/23/15 0618 10/23/15 1317  BP: 130/72 95/69  Pulse: 55 77  Temp: 36.6 C 36.2 C  Resp: 18 21    Last Pain:  Filed Vitals:   10/23/15 1319  PainSc: 0-No pain         Complications: No apparent anesthesia complications

## 2015-10-23 NOTE — Anesthesia Preprocedure Evaluation (Signed)
Anesthesia Evaluation  Patient identified by MRN, date of birth, ID band Patient awake    Reviewed: Allergy & Precautions, NPO status , Patient's Chart, lab work & pertinent test results  History of Anesthesia Complications Negative for: history of anesthetic complications  Airway Mallampati: I       Dental  (+) Missing   Pulmonary neg pulmonary ROS,           Cardiovascular hypertension, Pt. on medications and Pt. on home beta blockers      Neuro/Psych Anxiety negative neurological ROS     GI/Hepatic Neg liver ROS, GERD  Medicated,  Endo/Other  negative endocrine ROS  Renal/GU Renal Insufficiency     Musculoskeletal   Abdominal   Peds  Hematology   Anesthesia Other Findings   Reproductive/Obstetrics                             Anesthesia Physical Anesthesia Plan  ASA: II  Anesthesia Plan: General   Post-op Pain Management:    Induction: Intravenous  Airway Management Planned: LMA and Oral ETT  Additional Equipment:   Intra-op Plan:   Post-operative Plan:   Informed Consent: I have reviewed the patients History and Physical, chart, labs and discussed the procedure including the risks, benefits and alternatives for the proposed anesthesia with the patient or authorized representative who has indicated his/her understanding and acceptance.     Plan Discussed with:   Anesthesia Plan Comments:         Anesthesia Quick Evaluation

## 2015-10-23 NOTE — Anesthesia Postprocedure Evaluation (Signed)
Anesthesia Post Note  Patient: Aaron Stokes  Procedure(s) Performed: Procedure(s) (LRB): HOLEP-LASER ENUCLEATION OF THE PROSTATE WITH MORCELLATION (N/A)  Patient location during evaluation: PACU Anesthesia Type: General Level of consciousness: awake Pain management: pain level controlled Vital Signs Assessment: post-procedure vital signs reviewed and stable Respiratory status: spontaneous breathing Cardiovascular status: stable Anesthetic complications: no    Last Vitals:  Filed Vitals:   10/23/15 1332 10/23/15 1347  BP: 110/71 106/72  Pulse:    Temp:    Resp: 18 19    Last Pain:  Filed Vitals:   10/23/15 1351  PainSc: 0-No pain                 VAN STAVEREN,Chardai Gangemi

## 2015-10-23 NOTE — H&P (View-Only) (Signed)
09/27/2015 2:46 PM   Aaron Stokes 05/02/1940 [id number]  Referring provider: Ricke Hey, MD [place], [place] [address]  Chief Complaint  Patient presents with  . Benign Prostatic Hypertrophy    1week discuss surgery    HPI: 75 year old male with massive BPH, urinary retention with indwelling Foley catheter, right ureteral filling defect, and history of recurrent urinary tract infections who presents today to discuss possible further management of his BPH.   He was seen and evaluated by Dr. Pilar Stokes at which time options for further assessment of his ureteral filling defect were discussed. Antegrade ureteroscopy was discussed with Dr. Pilar Stokes as he was unable to access the UO in a retrograde fashion but Aaron Stokes is not interested.  Alternatively, his median lobe could be addressed which may also help significantly with his urinary retention/massive BPH in order to allow for ureteral access thereafter.  Calculated volume of his prostate based on most recent CT scan was proximally 310 cc. This primarily consist of intravesical median lobe.  Aaron Stokes previously underwent further workup with urodynamics which revealed a maximum flow rate of 7 mL's per second with a maximum detrusor pressure at peak flow of 50 cm of water. His postvoid residual on that occasion was 200 cc. He did have a heavily trabeculated bladder with a maximum capacity 400 cc.    He currently has an indwelling Foley catheter was changed monthly.  He has had a long-standing history of urinary issues, primarily obstructive.  He is not currently on finasteride or Flomax.  He is accompanied today by his brother-in-law who is his primary caretaker.   PMH: Past Medical History  Diagnosis Date  . Hypertension   . Anxiety   . Insomnia   . Acid reflux   . Chronic kidney disease     UTI; hematuria    Surgical History: Past Surgical History  Procedure Laterality Date  . No past surgeries    .  Circumcision, non-newborn    . Cystoscopy with stent placement Right 08/21/2015    Procedure: cystoscopy;  Surgeon: Nickie Retort, MD;  Location: [facility] ORS;  Service: Urology;  Laterality: Right;    Home Medications:    Medication List       This list is accurate as of: 09/27/15 11:59 PM.  Always use your most recent med list.               amLODipine 10 MG tablet  Commonly known as:  NORVASC  Take 10 mg by mouth at bedtime.     atenolol 50 MG tablet  Commonly known as:  TENORMIN  Take 50 mg by mouth at bedtime.     finasteride 5 MG tablet  Commonly known as:  PROSCAR  Take 1 tablet (5 mg total) by mouth daily.     HYDROcodone-acetaminophen 5-325 MG tablet  Commonly known as:  NORCO  Take 1 tablet by mouth every 6 (six) hours as needed for moderate pain.     nystatin-triamcinolone ointment  Commonly known as:  MYCOLOG  Apply 1 application topically 2 (two) times daily. Pull back foreskin and apply to head of penis     perphenazine-amitriptyline 4-25 MG Tabs tablet  Commonly known as:  ETRAFON/TRIAVIL  Take 1 tablet by mouth daily.        Allergies: No Known Allergies  Family History: Family History  Problem Relation Age of Onset  . Prostate cancer Neg Hx   . Bladder Cancer Father   .  Bladder Cancer Paternal Grandmother     Social History:  reports that he has never smoked. He has never used smokeless tobacco. He reports that he does not drink alcohol or use illicit drugs.  ROS: UROLOGY Frequent Urination?: No Hard to postpone urination?: No Burning/pain with urination?: No Get up at night to urinate?: No Leakage of urine?: No Urine stream starts and stops?: No Trouble starting stream?: No Do you have to strain to urinate?: No Blood in urine?: No Urinary tract infection?: No Sexually transmitted disease?: No Injury to kidneys or bladder?: No Painful intercourse?: No Weak stream?: No Erection problems?: No Penile pain?:  No  Gastrointestinal Nausea?: No Vomiting?: No Indigestion/heartburn?: No Diarrhea?: No Constipation?: No  Constitutional Fever: No Night sweats?: No Weight loss?: Yes Fatigue?: Yes  Skin Skin rash/lesions?: No Itching?: No  Eyes Blurred vision?: No Double vision?: No  Ears/Nose/Throat Sore throat?: No Sinus problems?: No  Hematologic/Lymphatic Swollen glands?: No Easy bruising?: No  Cardiovascular Leg swelling?: No Chest pain?: No  Respiratory Cough?: No Shortness of breath?: No  Endocrine Excessive thirst?: No  Musculoskeletal Back pain?: No Joint pain?: No  Neurological Headaches?: No Dizziness?: No  Psychologic Depression?: No Anxiety?: No  Physical Exam: BP 144/70 mmHg  Pulse 61  Ht 5\' 8"  (1.727 m)  Wt 173 lb (78.472 kg)  BMI 26.31 kg/m2  Constitutional:  Alert and oriented, No acute distress. HEENT: [place] AT, moist mucus membranes.  Trachea midline, no masses. Cardiovascular: No clubbing, cyanosis, or edema. RRR. Respiratory: Normal respiratory effort, no increased work of breathing.  CTAB. GI: Abdomen is soft, nontender, nondistended, no abdominal masses GU: No CVA tenderness.  Foley in place.   Skin: No rashes, bruises or suspicious lesions. Neurologic: Grossly intact, no focal deficits, moving all 4 extremities. Psychiatric: Normal mood and affect.  Laboratory Data: Lab Results  Component Value Date   WBC 6.1 05/15/2015   HGB 12.3* 05/15/2015   HCT 37.5* 05/15/2015   MCV 92.0 05/15/2015   PLT 217 05/15/2015    Lab Results  Component Value Date   CREATININE 1.20 09/03/2015   Pertinent Imaging: CT urogram reviewed from 09/03/15  Assessment & Plan:    1. BPH (benign prostatic hyperplasia)  Various options for treating his enlarged prostate were discussed today in detail. These include robotic simple prostatectomy, open simple prostatectomy, and holmium laser enucleation of the prostate. Each of these interventions will allow  for restoration of his normal trigonal anatomy and ideally access into the ureter for further evaluation as a staged procedure. We discussed the risk and benefits of each.  He is interested in holmium laser enucleation of the prostate. Given the massive size of the prostate, I would focus primarily on the median lobe. We also discussed possible need for a small cystotomy in order to remove the median lobe if the bladder does not distend adequately for morcellation in order to avoid injury to this structure. In addition, we discussed the risk of the procedure primarily including bleeding, damage to surrounding structures including the bladder, worsening of his irritative voiding symptoms, stress and urge incontinence, failure to resolve his urinary retention in addition to risk of general anesthesia.  Given the massive size of his gland, I will plan to admit him overnight for observation and CBI. In addition, we will check a UA/urine culture and likely start antibiotics prior to the procedure pending the result.     Finally, he has not had any recent PSA values. I do anticipate that this will  be quite elevated due to the massive size of his prostate as well as retention and indwelling Foley catheter. I would like to rule out an excessively high PSA and will check at the time of preoperative labs.  2. Urinary retention As above  3. Ureter filling defect Plan for ureteroscopy following treatment of his median lobe.  Discussion today with the patient and his brother-in-law was lengthy. All their questions were answered in detail.  Hollice Espy, MD  [facility] [address], [place],  [address] [phone]  I spent 25 min with this patient of which greater than 50% was spent in counseling and coordination of care with the patient.

## 2015-10-24 DIAGNOSIS — N400 Enlarged prostate without lower urinary tract symptoms: Secondary | ICD-10-CM

## 2015-10-24 DIAGNOSIS — N401 Enlarged prostate with lower urinary tract symptoms: Secondary | ICD-10-CM | POA: Diagnosis not present

## 2015-10-24 LAB — BASIC METABOLIC PANEL
Anion gap: 3 — ABNORMAL LOW (ref 5–15)
BUN: 17 mg/dL (ref 6–20)
CO2: 26 mmol/L (ref 22–32)
Calcium: 8.8 mg/dL — ABNORMAL LOW (ref 8.9–10.3)
Chloride: 111 mmol/L (ref 101–111)
Creatinine, Ser: 1.14 mg/dL (ref 0.61–1.24)
GFR calc Af Amer: >60 mL/min (ref >60)
GFR calc non Af Amer: >60 mL/min (ref >60)
Glucose, Bld: 143 mg/dL — ABNORMAL HIGH (ref 65–99)
Potassium: 4.5 mmol/L (ref 3.5–5.1)
Sodium: 140 mmol/L (ref 135–145)

## 2015-10-24 LAB — CBC
HCT: 23 % — ABNORMAL LOW (ref 40.0–52.0)
Hemoglobin: 7.8 g/dL — ABNORMAL LOW (ref 13.0–18.0)
MCH: 30.6 pg (ref 26.0–34.0)
MCHC: 33.9 g/dL (ref 32.0–36.0)
MCV: 90.1 fL (ref 80.0–100.0)
Platelets: 160 10*3/uL (ref 150–440)
RBC: 2.56 MIL/uL — ABNORMAL LOW (ref 4.40–5.90)
RDW: 13.6 % (ref 11.5–14.5)
WBC: 14.6 10*3/uL — ABNORMAL HIGH (ref 3.8–10.6)

## 2015-10-24 MED ORDER — OXYCODONE-ACETAMINOPHEN 5-325 MG PO TABS: 1.0000 | ORAL_TABLET | ORAL | Status: DC | PRN

## 2015-10-24 MED ORDER — LEVOFLOXACIN 500 MG PO TABS: 500.0000 mg | ORAL_TABLET | Freq: Every day | ORAL | Status: DC

## 2015-10-24 MED ORDER — DOCUSATE SODIUM 100 MG PO CAPS: 100.0000 mg | ORAL_CAPSULE | Freq: Two times a day (BID) | ORAL | Status: DC

## 2015-10-24 MED ORDER — OXYBUTYNIN CHLORIDE 5 MG PO TABS: 5.0000 mg | ORAL_TABLET | Freq: Three times a day (TID) | ORAL | Status: DC | PRN

## 2015-10-24 MED ORDER — FINASTERIDE 5 MG PO TABS: 5.0000 mg | ORAL_TABLET | Freq: Every day | ORAL | Status: DC

## 2015-10-24 NOTE — Progress Notes (Signed)
Patient discharged to home as ordered. Patient niece at the bedside and given discharge orders and follow up appointment. Patient sent home with foley still draining red clear fluid. Patient instructed to drink fluids as ordered.

## 2015-10-24 NOTE — Discharge Summary (Signed)
Date of admission: 10/23/2015  Date of discharge: 10/24/2015  Admission diagnosis: BPH with urinary obstruction/retention and right ureteral filling defect  Discharge diagnosis: Same  Secondary diagnoses:  Patient Active Problem List   Diagnosis Date Noted  . BPH (benign prostatic hypertrophy) with urinary retention 10/23/2015  . Foley catheter problem ([place])   . Hematuria   . Acute urinary retention 11/26/2014  . Hypertension 11/26/2014  . Anxiety 11/26/2014  . Insomnia 11/26/2014    History and Physical: For full details, please see admission history and physical. Briefly, Aaron Stokes is a 75 y.o. year old patient with massive BPH, urinary retention with indwelling Foley catheter, right ureteral filling defect, and history of recurrent urinary tract infections underwent holmium laser enucleation of the prostate on 10/23/2015 with Dr. Erlene Quan.    Hospital Course: Patient tolerated the procedure well.  He was then transferred to the floor after an uneventful PACU stay.  His hospital course was uncomplicated.  On POD# 1 he had met discharge criteria: was eating a regular diet, was up and ambulating independently,  pain was well controlled, was voiding without a catheter, and was ready to for discharge.   CBI was stopped in the morning. Urine was red tinged but no clots were seen and it was flowing easily.  Patient will be discharged home with Foley catheter. He'll return to the office in 1 week for Foley catheter removal. He is discharged on finasteride, oxybutynin and oxycodone/APAP for pain.  He is to contact the office if his Foley should stop draining, he experiences suprapubic pain, fever, chills, nausea or vomiting.  Physical Exam Constitutional: Well nourished. Alert and oriented, No acute distress. HEENT: [place] AT, moist mucus membranes. Trachea midline, no masses. Cardiovascular: No clubbing, cyanosis, or edema. Respiratory: Normal respiratory effort, no increased work of  breathing. GI: Abdomen is soft, non tender, non distended, no abdominal masses.  GU: No CVA tenderness.  No bladder fullness or masses.  Foley in place draining red tinged urine.   Rectal: Deferred.  Skin: No rashes, bruises or suspicious lesions. Lymph: No cervical or inguinal adenopathy. Neurologic: Grossly intact, no focal deficits, moving all 4 extremities. Psychiatric: Normal mood and affect.    Laboratory values:   Recent Labs  10/24/15 0354  WBC 14.6*  HGB 7.8*  HCT 23.0*    Recent Labs  10/24/15 0354  NA 140  K 4.5  CL 111  CO2 26  GLUCOSE 143*  BUN 17  CREATININE 1.14  CALCIUM 8.8*   No results for input(s): LABPT, INR in the last 72 hours. No results for input(s): LABURIN in the last 72 hours. Results for orders placed or performed during the hospital encounter of 10/09/15  Urine culture     Status: Abnormal   Collection Time: 10/09/15  1:08 PM  Result Value Ref Range Status   Specimen Description URINE, CATHETERIZED  Final   Special Requests NONE  Final   Culture (A)  Final    >=100,000 COLONIES/mL CITROBACTER FREUNDII >=100,000 COLONIES/mL ENTEROCOCCUS SPECIES    Report Status 10/12/2015 FINAL  Final   Organism ID, Bacteria CITROBACTER FREUNDII (A)  Final   Organism ID, Bacteria ENTEROCOCCUS SPECIES (A)  Final      Susceptibility   Citrobacter freundii - MIC*    CEFAZOLIN >=64 RESISTANT Resistant     CEFTRIAXONE <=1 SENSITIVE Sensitive     CIPROFLOXACIN <=0.25 SENSITIVE Sensitive     GENTAMICIN <=1 SENSITIVE Sensitive     IMIPENEM <=0.25 SENSITIVE Sensitive  NITROFURANTOIN <=16 SENSITIVE Sensitive     TRIMETH/SULFA <=20 SENSITIVE Sensitive     PIP/TAZO <=4 SENSITIVE Sensitive     * >=100,000 COLONIES/mL CITROBACTER FREUNDII   Enterococcus species - MIC*    AMPICILLIN <=2 SENSITIVE Sensitive     LEVOFLOXACIN 1 SENSITIVE Sensitive     NITROFURANTOIN <=16 SENSITIVE Sensitive     VANCOMYCIN 1 SENSITIVE Sensitive     * >=100,000 COLONIES/mL  ENTEROCOCCUS SPECIES    Disposition: Home  Discharge instruction: The patient was instructed to be ambulatory but told to refrain from heavy lifting, strenuous activity, or driving.  Instructed on Foley catheter care.  Discharge medications:   Medication List    TAKE these medications        amLODipine 10 MG tablet  Commonly known as:  NORVASC  Take 10 mg by mouth at bedtime.     atenolol 50 MG tablet  Commonly known as:  TENORMIN  Take 50 mg by mouth at bedtime.     docusate sodium 100 MG capsule  Commonly known as:  COLACE  Take 1 capsule (100 mg total) by mouth 2 (two) times daily.     docusate sodium 100 MG capsule  Commonly known as:  COLACE  Take 1 capsule (100 mg total) by mouth 2 (two) times daily.     finasteride 5 MG tablet  Commonly known as:  PROSCAR  Take 1 tablet (5 mg total) by mouth daily.     finasteride 5 MG tablet  Commonly known as:  PROSCAR  Take 1 tablet (5 mg total) by mouth daily.     levofloxacin 500 MG tablet  Commonly known as:  LEVAQUIN  Take 1 tablet (500 mg total) by mouth daily.     oxybutynin 5 MG tablet  Commonly known as:  DITROPAN  Take 1 tablet (5 mg total) by mouth every 8 (eight) hours as needed for bladder spasms.     oxybutynin 5 MG tablet  Commonly known as:  DITROPAN  Take 1 tablet (5 mg total) by mouth every 8 (eight) hours as needed for bladder spasms.     oxyCODONE-acetaminophen 5-325 MG tablet  Commonly known as:  PERCOCET  Take 1-2 tablets by mouth every 4 (four) hours as needed for moderate pain or severe pain.     oxyCODONE-acetaminophen 5-325 MG tablet  Commonly known as:  PERCOCET/ROXICET  Take 1-2 tablets by mouth every 4 (four) hours as needed for moderate pain.     perphenazine-amitriptyline 4-25 MG Tabs tablet  Commonly known as:  ETRAFON/TRIAVIL  Take 1 tablet by mouth daily.        Followup:      Follow-up Information    Follow up with [facility] on 10/31/2015.   Why:   Thursday at 9:30am for foley catheter removal   Contact information:   [address] [place], [place] [address] [phone]

## 2015-10-25 LAB — SURGICAL PATHOLOGY

## 2015-10-31 ENCOUNTER — Ambulatory Visit (INDEPENDENT_AMBULATORY_CARE_PROVIDER_SITE_OTHER): Payer: Medicare Other

## 2015-10-31 DIAGNOSIS — R339 Retention of urine, unspecified: Secondary | ICD-10-CM | POA: Diagnosis not present

## 2015-10-31 NOTE — Progress Notes (Signed)
Fill and Pull Catheter Removal  Patient is present today for a catheter removal.  Patient was cleaned and prepped in a sterile fashion 219ml of sterile water/ saline was instilled into the bladder when the patient felt the urge to urinate. 50ml of water was then drained from the balloon.  A 24FR foley cath was removed from the bladder no complications were noted .  Patient as then given some time to void on their own.  Patient can void  256ml on their own after some time.  Patient tolerated well.  Preformed by: Toniann Fail, LPN   Follow up/ Additional notes: Reinforced with pt to drink plenty of fluids today and if not able to urinate this afternoon to give [place] a call. Pt voiced understanding.

## 2015-11-21 ENCOUNTER — Telehealth: Payer: Self-pay | Admitting: Radiology

## 2015-11-21 ENCOUNTER — Ambulatory Visit (INDEPENDENT_AMBULATORY_CARE_PROVIDER_SITE_OTHER): Payer: Medicare Other | Admitting: Urology

## 2015-11-21 VITALS — Ht 68.0 in | Wt 172.6 lb

## 2015-11-21 DIAGNOSIS — R338 Other retention of urine: Principal | ICD-10-CM

## 2015-11-21 DIAGNOSIS — R9341 Abnormal radiologic findings on diagnostic imaging of renal pelvis, ureter, or bladder: Secondary | ICD-10-CM

## 2015-11-21 DIAGNOSIS — N401 Enlarged prostate with lower urinary tract symptoms: Secondary | ICD-10-CM

## 2015-11-21 DIAGNOSIS — N400 Enlarged prostate without lower urinary tract symptoms: Secondary | ICD-10-CM

## 2015-11-21 LAB — BLADDER SCAN AMB NON-IMAGING: Scan Result: 97

## 2015-11-21 NOTE — Addendum Note (Signed)
Addended by: Wilson Singer on: 11/21/2015 01:47 PM   Modules accepted: Orders

## 2015-11-21 NOTE — Telephone Encounter (Signed)
Notified pt's brother-in-law, Vedia Pereyra, of surgery scheduled 12/11/15 with Dr Pilar Jarvis, pre-admit phone interview on 12/04/15 between 1-5pm & to call day prior to surgery for arrival time to SDS. [name] voices understanding.

## 2015-11-21 NOTE — Progress Notes (Signed)
11/21/2015 10:50 AM   Aaron Stokes [birth year] [id number]  Referring provider: Ricke Hey, MD [place], [place] [id number]  Chief Complaint  Patient presents with  . 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 Initially, his right ureteral filling defect was unable to be addressed due to inability to intubate the right ureteral orifice due to massive BPH. He underwent a HOLEP which resolved his urinary retention issues. It also made his right ureteral orifice now visible. He presents today to discuss right ureteroscopy and direct visualization of the ureteral filling defect on the right.   PMH: Past Medical History:  Diagnosis Date  . Acid reflux   . Anxiety   . Chronic kidney disease    UTI; hematuria  . Hypertension   . Insomnia     Surgical History: Past Surgical History:  Procedure Laterality Date  . CIRCUMCISION, NON-NEWBORN    . CYSTOSCOPY WITH STENT PLACEMENT Right 08/21/2015   Procedure: cystoscopy;  Surgeon: Nickie Retort, MD;  Location: [facility] ORS;  Service: Urology;  Laterality: Right;  . HOLEP-LASER ENUCLEATION OF THE PROSTATE WITH MORCELLATION N/A 10/23/2015   Procedure: HOLEP-LASER ENUCLEATION OF THE PROSTATE WITH MORCELLATION;  Surgeon: Hollice Espy, MD;  Location: [facility] ORS;  Service: Urology;  Laterality: N/A;    Home Medications:    Medication List       Accurate as of 11/21/15 10:50 AM. Always use your most recent med list.          amLODipine 10 MG tablet Commonly known as:  NORVASC Take 10 mg by mouth at bedtime.   atenolol 50 MG tablet Commonly known as:  TENORMIN Take 50 mg by mouth at bedtime.   docusate sodium 100 MG capsule Commonly known as:  COLACE Take 1 capsule (100 mg total) by mouth 2 (two) times daily.   docusate sodium 100 MG capsule Commonly known as:   COLACE Take 1 capsule (100 mg total) by mouth 2 (two) times daily.   finasteride 5 MG tablet Commonly known as:  PROSCAR Take 1 tablet (5 mg total) by mouth daily.   oxybutynin 5 MG tablet Commonly known as:  DITROPAN Take 1 tablet (5 mg total) by mouth every 8 (eight) hours as needed for bladder spasms.   oxyCODONE-acetaminophen 5-325 MG tablet Commonly known as:  PERCOCET Take 1-2 tablets by mouth every 4 (four) hours as needed for moderate pain or severe pain.   perphenazine-amitriptyline 4-25 MG Tabs tablet Commonly known as:  ETRAFON/TRIAVIL Take 1 tablet by mouth daily.       Allergies: No Known Allergies  Family History: Family History  Problem Relation Age of Onset  . Prostate cancer Neg Hx   . Bladder Cancer Father   . Bladder Cancer Paternal Grandmother     Social History:  reports that he has never smoked. He has never used smokeless tobacco. He reports that he does not drink alcohol or use drugs.  ROS: UROLOGY Frequent Urination?: No Hard to postpone urination?: No Burning/pain with urination?: No Get up at night to urinate?: No Leakage of urine?: No Urine stream starts and stops?: No Trouble starting stream?: No Do you have to strain to urinate?: No Blood in urine?: No Urinary tract infection?: No Sexually transmitted disease?: No Injury to kidneys or bladder?: No Painful intercourse?: No Weak stream?: No Erection  problems?: No Penile pain?: No  Gastrointestinal Nausea?: No Vomiting?: No Indigestion/heartburn?: No Diarrhea?: No Constipation?: No  Constitutional Fever: No Night sweats?: No Weight loss?: No Fatigue?: No  Skin Skin rash/lesions?: No Itching?: No  Eyes Blurred vision?: No Double vision?: No  Ears/Nose/Throat Sore throat?: No Sinus problems?: No  Hematologic/Lymphatic Swollen glands?: No Easy bruising?: No  Cardiovascular Leg swelling?: No Chest pain?: No  Respiratory Cough?: No Shortness of breath?:  No  Endocrine Excessive thirst?: No  Musculoskeletal Back pain?: No Joint pain?: No  Neurological Headaches?: No Dizziness?: No  Psychologic Depression?: No Anxiety?: No  Physical Exam: Ht 5\' 8"  (1.727 m)   Wt 172 lb 9.6 oz (78.3 kg)   BMI 26.24 kg/m   Constitutional:  Alert and oriented, No acute distress. HEENT: [place] AT, moist mucus membranes.  Trachea midline, no masses. Cardiovascular: No clubbing, cyanosis, or edema. Respiratory: Normal respiratory effort, no increased work of breathing. GI: Abdomen is soft, nontender, nondistended, no abdominal masses GU: No CVA tenderness.  Skin: No rashes, bruises or suspicious lesions. Lymph: No cervical or inguinal adenopathy. Neurologic: Grossly intact, no focal deficits, moving all 4 extremities. Psychiatric: Normal mood and affect.  Laboratory Data: Lab Results  Component Value Date   WBC 14.6 (H) 10/24/2015   HGB 7.8 (L) 10/24/2015   HCT 23.0 (L) 10/24/2015   MCV 90.1 10/24/2015   PLT 160 10/24/2015    Lab Results  Component Value Date   CREATININE 1.14 10/24/2015    Lab Results  Component Value Date   PSA 19.09 (H) 10/09/2015    No results found for: TESTOSTERONE  No results found for: HGBA1C  Urinalysis    Component Value Date/Time   COLORURINE YELLOW (A) 10/09/2015 1308   APPEARANCEUR TURBID (A) 10/09/2015 1308   APPEARANCEUR Cloudy (A) 08/19/2015 1420   LABSPEC 1.018 10/09/2015 1308   PHURINE 5.0 10/09/2015 1308   GLUCOSEU NEGATIVE 10/09/2015 1308   HGBUR 3+ (A) 10/09/2015 1308   BILIRUBINUR NEGATIVE 10/09/2015 1308   BILIRUBINUR Negative 08/19/2015 1420   KETONESUR NEGATIVE 10/09/2015 1308   PROTEINUR >500 (A) 10/09/2015 1308   NITRITE POSITIVE (A) 10/09/2015 1308   LEUKOCYTESUR 3+ (A) 10/09/2015 1308   LEUKOCYTESUR 2+ (A) 08/19/2015 1420      Assessment & Plan:    I discussed the risks, benefits, indications of cystoscopy with right ureteroscopy to assess his right ureteral filling  defect. He understands the risks include but are not limited to bleeding, infection, inability to access the right ureter, and injury to surrounding structures. He understands if there is a mass there that we will biopsy this mass and possibly ablated with the laser. He also understands he may need a right ureteral stent after the procedure. All questions were answered. The patient has elected to proceed.  1. Right ureteral filling defeft -Cystoscopy, right ureteroscopy, possible ureteral biopsy, possible laser ablation, possible right ureteral stent.   2. BPH Asymptomatic status post [place], MD  [facility] [address], [place], [place] [address] [phone]

## 2015-12-04 NOTE — Patient Instructions (Signed)
  Your procedure is scheduled on: 12-11-15 Report to Same Day Surgery 2nd floor medical mall To find out your arrival time please call [phone] between 1PM - 3PM on 12-10-15  Remember: Instructions that are not followed completely may result in serious medical risk, up to and including death, or upon the discretion of your surgeon and anesthesiologist your surgery may need to be rescheduled.    _x___ 1. Do not eat food or drink liquids after midnight. No gum chewing or hard candies.     __x__ 2. No Alcohol for 24 hours before or after surgery.   __x__3. No Smoking for 24 prior to surgery.   ____  4. Bring all medications with you on the day of surgery if instructed.    __x__ 5. Notify your doctor if there is any change in your medical condition     (cold, fever, infections).     Do not wear jewelry, make-up, hairpins, clips or nail polish.  Do not wear lotions, powders, or perfumes. You may wear deodorant.  Do not shave 48 hours prior to surgery. Men may shave face and neck.  Do not bring valuables to the hospital.    [facility] is not responsible for any belongings or valuables.               Contacts, dentures or bridgework may not be worn into surgery.  Leave your suitcase in the car. After surgery it may be brought to your room.  For patients admitted to the hospital, discharge time is determined by your treatment team.   Patients discharged the day of surgery will not be allowed to drive home.    Please read over the following fact sheets that you were given:   [facility] Preparing for Surgery and or MRSA Information   ____ Take these medicines the morning of surgery with A SIP OF WATER:    1. none  2.  3.  4.  5.  6.  ____ Fleet Enema (as directed)   ____ Use CHG Soap or sage wipes as directed on instruction sheet   ____ Use inhalers on the day of surgery and bring to hospital day of surgery  ____ Stop metformin 2 days prior to surgery    ____ Take 1/2 of  usual insulin dose the night before surgery and none on the morning of  surgery.   ____ Stop aspirin or coumadin, or plavix  _x__ Stop Anti-inflammatories such as Advil, Aleve, Ibuprofen, Motrin, Naproxen,          Naprosyn, Goodies powders or aspirin products. Ok to take Tylenol.   ____ Stop supplements until after surgery.    ____ Bring C-Pap to the hospital.

## 2015-12-05 ENCOUNTER — Encounter
Admission: RE | Admit: 2015-12-05 | Discharge: 2015-12-05 | Disposition: A | Discharge status: Home / Self Care | Payer: Medicare Other | Source: Ambulatory Visit | Attending: Urology | Admitting: Urology

## 2015-12-05 DIAGNOSIS — Z01812 Encounter for preprocedural laboratory examination: Secondary | ICD-10-CM | POA: Insufficient documentation

## 2015-12-05 LAB — BASIC METABOLIC PANEL
Anion gap: 2 — ABNORMAL LOW (ref 5–15)
BUN: 14 mg/dL (ref 6–20)
CO2: 33 mmol/L — ABNORMAL HIGH (ref 22–32)
Calcium: 10 mg/dL (ref 8.9–10.3)
Chloride: 106 mmol/L (ref 101–111)
Creatinine, Ser: 1.18 mg/dL (ref 0.61–1.24)
GFR calc Af Amer: >60 mL/min (ref >60)
GFR calc non Af Amer: 59 mL/min — ABNORMAL LOW (ref >60)
Glucose, Bld: 93 mg/dL (ref 65–99)
Potassium: 3.7 mmol/L (ref 3.5–5.1)
Sodium: 141 mmol/L (ref 135–145)

## 2015-12-05 LAB — CBC
HCT: 35.7 % — ABNORMAL LOW (ref 40.0–52.0)
Hemoglobin: 12 g/dL — ABNORMAL LOW (ref 13.0–18.0)
MCH: 30.5 pg (ref 26.0–34.0)
MCHC: 33.6 g/dL (ref 32.0–36.0)
MCV: 90.8 fL (ref 80.0–100.0)
Platelets: 180 10*3/uL (ref 150–440)
RBC: 3.94 MIL/uL — ABNORMAL LOW (ref 4.40–5.90)
RDW: 13.3 % (ref 11.5–14.5)
WBC: 4.1 10*3/uL (ref 3.8–10.6)

## 2015-12-10 ENCOUNTER — Encounter: Payer: Self-pay | Admitting: *Deleted

## 2015-12-11 ENCOUNTER — Ambulatory Visit: Payer: Medicare Other | Admitting: Anesthesiology

## 2015-12-11 ENCOUNTER — Encounter
Admission: RE | Disposition: A | Discharge status: Home / Self Care | Payer: Self-pay | Source: Ambulatory Visit | Attending: Urology

## 2015-12-11 ENCOUNTER — Ambulatory Visit
Admission: RE | Admit: 2015-12-11 | Discharge: 2015-12-11 | Disposition: A | Discharge status: Home / Self Care | Payer: Medicare Other | Source: Ambulatory Visit | Attending: Urology | Admitting: Urology

## 2015-12-11 ENCOUNTER — Encounter: Payer: Self-pay | Admitting: *Deleted

## 2015-12-11 DIAGNOSIS — N3080 Other cystitis without hematuria: Secondary | ICD-10-CM | POA: Diagnosis not present

## 2015-12-11 DIAGNOSIS — Z79899 Other long term (current) drug therapy: Secondary | ICD-10-CM | POA: Insufficient documentation

## 2015-12-11 DIAGNOSIS — I129 Hypertensive chronic kidney disease with stage 1 through stage 4 chronic kidney disease, or unspecified chronic kidney disease: Secondary | ICD-10-CM | POA: Insufficient documentation

## 2015-12-11 DIAGNOSIS — N401 Enlarged prostate with lower urinary tract symptoms: Secondary | ICD-10-CM | POA: Insufficient documentation

## 2015-12-11 DIAGNOSIS — N189 Chronic kidney disease, unspecified: Secondary | ICD-10-CM | POA: Insufficient documentation

## 2015-12-11 DIAGNOSIS — K219 Gastro-esophageal reflux disease without esophagitis: Secondary | ICD-10-CM | POA: Diagnosis not present

## 2015-12-11 DIAGNOSIS — Z9889 Other specified postprocedural states: Secondary | ICD-10-CM | POA: Insufficient documentation

## 2015-12-11 DIAGNOSIS — D494 Neoplasm of unspecified behavior of bladder: Secondary | ICD-10-CM

## 2015-12-11 DIAGNOSIS — Z79891 Long term (current) use of opiate analgesic: Secondary | ICD-10-CM | POA: Diagnosis not present

## 2015-12-11 DIAGNOSIS — R9341 Abnormal radiologic findings on diagnostic imaging of renal pelvis, ureter, or bladder: Secondary | ICD-10-CM | POA: Insufficient documentation

## 2015-12-11 DIAGNOSIS — F419 Anxiety disorder, unspecified: Secondary | ICD-10-CM | POA: Diagnosis not present

## 2015-12-11 DIAGNOSIS — R338 Other retention of urine: Secondary | ICD-10-CM | POA: Diagnosis not present

## 2015-12-11 HISTORY — PX: TRANSURETHRAL RESECTION OF BLADDER TUMOR: SHX2575

## 2015-12-11 HISTORY — PX: CYSTOSCOPY WITH STENT PLACEMENT: SHX5790

## 2015-12-11 HISTORY — PX: URETEROSCOPY WITH HOLMIUM LASER LITHOTRIPSY: SHX6645

## 2015-12-11 HISTORY — DX: Unspecified intellectual disabilities: F79

## 2015-12-11 SURGERY — URETEROSCOPY, WITH LITHOTRIPSY USING HOLMIUM LASER
Anesthesia: General | Laterality: Right

## 2015-12-11 MED ORDER — CEFAZOLIN SODIUM-DEXTROSE 2-4 GM/100ML-% IV SOLN
INTRAVENOUS | Status: AC
  Filled 2015-12-11: qty 100

## 2015-12-11 MED ORDER — LACTATED RINGERS IV SOLN
INTRAVENOUS | Status: DC
  Administered 2015-12-11 (×2): via INTRAVENOUS

## 2015-12-11 MED ORDER — CEPHALEXIN 500 MG PO CAPS: 500.0000 mg | ORAL_CAPSULE | Freq: Three times a day (TID) | ORAL | 0 refills | Status: DC

## 2015-12-11 MED ORDER — ONDANSETRON HCL 4 MG/2ML IJ SOLN
INTRAMUSCULAR | Status: DC | PRN
  Administered 2015-12-11: 4 mg via INTRAVENOUS

## 2015-12-11 MED ORDER — DEXAMETHASONE SODIUM PHOSPHATE 10 MG/ML IJ SOLN
INTRAMUSCULAR | Status: DC | PRN
  Administered 2015-12-11: 10 mg via INTRAVENOUS

## 2015-12-11 MED ORDER — LIDOCAINE HCL (CARDIAC) 20 MG/ML IV SOLN
INTRAVENOUS | Status: DC | PRN
  Administered 2015-12-11: 100 mg via INTRAVENOUS

## 2015-12-11 MED ORDER — GLYCOPYRROLATE 0.2 MG/ML IJ SOLN
INTRAMUSCULAR | Status: DC | PRN
  Administered 2015-12-11: 0.2 mg via INTRAVENOUS

## 2015-12-11 MED ORDER — PROPOFOL 10 MG/ML IV BOLUS
INTRAVENOUS | Status: DC | PRN
  Administered 2015-12-11: 150 mg via INTRAVENOUS

## 2015-12-11 MED ORDER — FAMOTIDINE 20 MG PO TABS
20.0000 mg | ORAL_TABLET | Freq: Once | ORAL | Status: AC
  Administered 2015-12-11: 20 mg via ORAL

## 2015-12-11 MED ORDER — HYDROCODONE-ACETAMINOPHEN 5-325 MG PO TABS: 1.0000 | ORAL_TABLET | ORAL | 0 refills | Status: DC | PRN

## 2015-12-11 MED ORDER — ONDANSETRON HCL 4 MG/2ML IJ SOLN: 4.0000 mg | Freq: Once | INTRAMUSCULAR | Status: DC | PRN

## 2015-12-11 MED ORDER — FAMOTIDINE 20 MG PO TABS
ORAL_TABLET | ORAL | Status: AC
  Filled 2015-12-11: qty 1

## 2015-12-11 MED ORDER — FENTANYL CITRATE (PF) 100 MCG/2ML IJ SOLN
INTRAMUSCULAR | Status: DC | PRN
  Administered 2015-12-11: 50 ug via INTRAVENOUS

## 2015-12-11 MED ORDER — CEFAZOLIN SODIUM-DEXTROSE 2-4 GM/100ML-% IV SOLN
2.0000 g | Freq: Once | INTRAVENOUS | Status: AC
  Administered 2015-12-11: 2 g via INTRAVENOUS

## 2015-12-11 MED ORDER — FENTANYL CITRATE (PF) 100 MCG/2ML IJ SOLN: 25.0000 ug | INTRAMUSCULAR | Status: DC | PRN

## 2015-12-11 SURGICAL SUPPLY — 36 items
BACTOSHIELD CHG 4% 4OZ (MISCELLANEOUS)
BASKET ZERO TIP 1.9FR (BASKET) IMPLANT
BRUSH SCRUB EZ  4% CHG (MISCELLANEOUS)
BRUSH SCRUB EZ 4% CHG (MISCELLANEOUS) IMPLANT
CATH URETL 5X70 OPEN END (CATHETERS) ×4 IMPLANT
CNTNR SPEC 2.5X3XGRAD LEK (MISCELLANEOUS)
CONT SPEC 4OZ STER OR WHT (MISCELLANEOUS)
CONTAINER SPEC 2.5X3XGRAD LEK (MISCELLANEOUS) IMPLANT
DRESSING TELFA 4X3 1S ST N-ADH (GAUZE/BANDAGES/DRESSINGS) ×4 IMPLANT
ELECT LOOP 22F BIPOLAR SML (ELECTROSURGICAL) ×4
ELECTRODE LOOP 22F BIPOLAR SML (ELECTROSURGICAL) ×3 IMPLANT
EVACUATOR ELLICK (MISCELLANEOUS) ×4 IMPLANT
FORCEPS BIOP PIRANHA Y (CUTTING FORCEPS) IMPLANT
GLOVE BIO SURGEON STRL SZ7 (GLOVE) ×8 IMPLANT
GLOVE BIO SURGEON STRL SZ7.5 (GLOVE) ×8 IMPLANT
GOWN L4 LG 24 PK N/S (GOWN DISPOSABLE) ×4 IMPLANT
GOWN STRL REUS W/ TWL LRG LVL4 (GOWN DISPOSABLE) ×3 IMPLANT
GOWN STRL REUS W/ TWL XL LVL3 (GOWN DISPOSABLE) ×3 IMPLANT
GOWN STRL REUS W/TWL LRG LVL4 (GOWN DISPOSABLE) ×1
GOWN STRL REUS W/TWL XL LVL3 (GOWN DISPOSABLE) ×5 IMPLANT
GUIDEWIRE SUPER STIFF (WIRE) IMPLANT
INTRODUCER DILATOR DOUBLE (INTRODUCER) IMPLANT
KIT RM TURNOVER CYSTO AR (KITS) ×4 IMPLANT
PACK CYSTO AR (MISCELLANEOUS) ×4 IMPLANT
SCRUB CHG 4% DYNA-HEX 4OZ (MISCELLANEOUS) IMPLANT
SENSORWIRE 0.038 NOT ANGLED (WIRE) ×4
SET CYSTO W/LG BORE CLAMP LF (SET/KITS/TRAYS/PACK) ×4 IMPLANT
SHEATH URETERAL 13/15X36 1L (SHEATH) IMPLANT
SOL .9 NS 3000ML IRR  AL (IV SOLUTION) ×1
SOL .9 NS 3000ML IRR UROMATIC (IV SOLUTION) ×3 IMPLANT
STENT URET 6FRX24 CONTOUR (STENTS) IMPLANT
STENT URET 6FRX26 CONTOUR (STENTS) ×4 IMPLANT
SURGILUBE 2OZ TUBE FLIPTOP (MISCELLANEOUS) ×4 IMPLANT
SYRINGE IRR TOOMEY STRL 70CC (SYRINGE) ×4 IMPLANT
WATER STERILE IRR 1000ML POUR (IV SOLUTION) ×4 IMPLANT
WIRE SENSOR 0.038 NOT ANGLED (WIRE) ×3 IMPLANT

## 2015-12-11 NOTE — Op Note (Signed)
Date of procedure: 12/11/15  Preoperative diagnosis:  1. Right ureteral filling defect   Postoperative diagnosis:  1. Right ureteral filling defect 2. Bladder tumor   Procedure: 1. Cystoscopy 2. Right ureteroscopy 3. Right ureteral biopsy 4. Right retrograde pyelogram with interpretation 5. 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 There did not appear to be a tumor at this area of the services biopsy 3. He also had reactive tumor in the posterior bladder does. 3 cm in size that was resected. Right retrograde pyelogram showed good drainage of contrast without retention of contrast.  EBL: None  Specimens: Right ureteral biopsy, bladder tumor  Drains: 6 French by 26 cm right double-J ureteral stent  Disposition: Stable to the postanesthesia care unit  Indication for procedure: The patient is a 75 y.o. male with history of HOLEP who presents today for evaluation of a right ureteral filling defect that was impossible prior to HOLEP due to right ureteral obstruction.  After reviewing the management options for treatment, the patient elected to proceed with the above surgical procedure(s). We have discussed the potential benefits and risks of the procedure, side effects of the proposed treatment, the likelihood of the patient achieving the goals of the procedure, and any potential problems that might occur during the procedure or recuperation. Informed consent has been obtained.  Description of procedure: The patient was met in the preoperative area. All risks, benefits, and indications of the procedure were described in great detail. The patient consented to the procedure. Preoperative antibiotics were given. The patient was taken to the operative theater. General anesthesia was induced per the anesthesia  service. The patient was then placed in the dorsal lithotomy position and prepped and draped in the usual sterile fashion. A preoperative timeout was called.   A 20 French 30 cystoscope was inserted into the patient's bladder per urethra atraumatically. The prosthetic fossa was noted to be wide open secondary to recent HOLEP. The right ureteral orifice was identified and a sensor wire was placed the level of renal pelvis under fluoroscopy. Cystoscope was exchanged for a semirigid ureteroscope. Pan ureteroscopy in the right side showed no tumors or filling defects. There was narrowing near the transition point between the mid and distal ureter as expected is seen in CT urogram. This area was biopsied but did not look malignant. 3 biopsies were sent to pathology. There was no significant bleeding. A right retrograde pyelogram was obtained which showed no significant filling defects and good drainage of the contrast past the biopsy site with no retention of contrast. A 6 French by 26 cm double-J ureteral stent was placed over the sensor wire with the cystoscope. Sensor was removed. A curl seen in the patient's renal pelvis on fluoroscopy and a curl in the urinary bladder direct visualization.  There was also noted admission in bladder or bladder tumor in the posterior wall that looked possibly is inflammation secondary to recent HOLEP. However bladder cancer cannot be ruled out. The decision was made to resect this 3 cm tumor with the resectoscope. Hemostasis was obtained and was excellent. All tumor fragments were evacuated and sent to pathology. Muscle was identified at the base of the resection site. At this point hemostasis was excellent. The patient's bladder was drainage transferred stable condition to postanesthesia care unit.  Plan: The patient will follow-up in one week  for right ureteral stent removal as well as to discuss his pathology.  Baruch Gouty, M.D.

## 2015-12-11 NOTE — Interval H&P Note (Signed)
History and Physical Interval Note:  12/11/2015 12:52 PM  Aaron Stokes  has presented today for surgery, with the diagnosis of right ureteral filling defect  The various methods of treatment have been discussed with the patient and family. After consideration of risks, benefits and other options for treatment, the patient has consented to  Procedure(s): CYSTOSCOPY WITH BIOPSY (N/A) URETEROSCOPY WITH HOLMIUM LASER LITHOTRIPSY (Right) CYSTOSCOPY WITH STENT PLACEMENT (Right) as a surgical intervention .  The patient's history has been reviewed, patient examined, no change in status, stable for surgery.  I have reviewed the patient's chart and labs.  Questions were answered to the patient's satisfaction.    RRR Lungs clear  Nickie Retort

## 2015-12-11 NOTE — Anesthesia Preprocedure Evaluation (Signed)
Anesthesia Evaluation  Patient identified by MRN, date of birth, ID band Patient awake    Reviewed: Allergy & Precautions, NPO status , Patient's Chart, lab work & pertinent test results  History of Anesthesia Complications Negative for: history of anesthetic complications  Airway Mallampati: II       Dental  (+) Teeth Intact   Pulmonary neg pulmonary ROS,    breath sounds clear to auscultation       Cardiovascular Exercise Tolerance: Good hypertension, Pt. on medications  Rhythm:Regular     Neuro/Psych negative neurological ROS     GI/Hepatic Neg liver ROS, GERD  Medicated,  Endo/Other  negative endocrine ROS  Renal/GU      Musculoskeletal   Abdominal Normal abdominal exam  (+)   Peds  Hematology   Anesthesia Other Findings   Reproductive/Obstetrics                             Anesthesia Physical Anesthesia Plan  ASA: III  Anesthesia Plan: General   Post-op Pain Management:    Induction: Intravenous  Airway Management Planned: LMA  Additional Equipment:   Intra-op Plan:   Post-operative Plan: Extubation in OR  Informed Consent: I have reviewed the patients History and Physical, chart, labs and discussed the procedure including the risks, benefits and alternatives for the proposed anesthesia with the patient or authorized representative who has indicated his/her understanding and acceptance.     Plan Discussed with: CRNA  Anesthesia Plan Comments:         Anesthesia Quick Evaluation

## 2015-12-11 NOTE — Transfer of Care (Signed)
Immediate Anesthesia Transfer of Care Note  Patient: Aaron Stokes  Procedure(s) Performed: Procedure(s): URETEROSCOPY with biopsy (Right) CYSTOSCOPY WITH STENT PLACEMENT (Right) TRANSURETHRAL RESECTION OF BLADDER TUMOR (TURBT)  Patient Location: PACU  Anesthesia Type:General  Level of Consciousness: sedated  Airway & Oxygen Therapy: Patient Spontanous Breathing and Patient connected to face mask oxygen  Post-op Assessment: Report given to RN and Post -op Vital signs reviewed and stable  Post vital signs: Reviewed and stable  Last Vitals:  Vitals:   12/11/15 1240 12/11/15 1410  BP: 131/72 116/60  Pulse: (!) 57 (!) 42  Resp: 16 16  Temp: 36.5 C 36.2 C    Last Pain:  Vitals:   12/11/15 1240  TempSrc: Tympanic         Complications: No apparent anesthesia complications

## 2015-12-11 NOTE — Anesthesia Postprocedure Evaluation (Signed)
Anesthesia Post Note  Patient: Charels Bitterman  Procedure(s) Performed: Procedure(s) (LRB): URETEROSCOPY with biopsy (Right) CYSTOSCOPY WITH STENT PLACEMENT (Right) TRANSURETHRAL RESECTION OF BLADDER TUMOR (TURBT)  Patient location during evaluation: PACU Anesthesia Type: General Level of consciousness: awake Pain management: satisfactory to patient Vital Signs Assessment: post-procedure vital signs reviewed and stable Respiratory status: nonlabored ventilation Cardiovascular status: stable Anesthetic complications: no    Last Vitals:  Vitals:   12/11/15 1410 12/11/15 1420  BP: 116/60   Pulse: (!) 42 (!) 41  Resp: 16 12  Temp: 36.2 C     Last Pain:  Vitals:   12/11/15 1240  TempSrc: Tympanic                 VAN STAVEREN,Teigen Bellin

## 2015-12-11 NOTE — Anesthesia Procedure Notes (Signed)
Procedure Name: LMA Insertion Date/Time: 12/11/2015 1:31 PM Performed by: Nelda Marseille Pre-anesthesia Checklist: Patient identified, Patient being monitored, Timeout performed, Emergency Drugs available and Suction available Patient Re-evaluated:Patient Re-evaluated prior to inductionOxygen Delivery Method: Circle system utilized Preoxygenation: Pre-oxygenation with 100% oxygen Intubation Type: IV induction Ventilation: Mask ventilation without difficulty LMA: LMA inserted LMA Size: 4.5 Tube type: Oral Number of attempts: 1 Placement Confirmation: positive ETCO2 and breath sounds checked- equal and bilateral Tube secured with: Tape Dental Injury: Teeth and Oropharynx as per pre-operative assessment

## 2015-12-11 NOTE — Discharge Instructions (Signed)

## 2015-12-11 NOTE — H&P (View-Only) (Signed)
11/21/2015 10:50 AM   Aaron Stokes Apr 30, 1940 [id number]  Referring provider: Ricke Hey, MD [place], [place] [address]  Chief Complaint  Patient presents with  . 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 Initially, his right ureteral filling defect was unable to be addressed due to inability to intubate the right ureteral orifice due to massive BPH. He underwent a HOLEP which resolved his urinary retention issues. It also made his right ureteral orifice now visible. He presents today to discuss right ureteroscopy and direct visualization of the ureteral filling defect on the right.   PMH: Past Medical History:  Diagnosis Date  . Acid reflux   . Anxiety   . Chronic kidney disease    UTI; hematuria  . Hypertension   . Insomnia     Surgical History: Past Surgical History:  Procedure Laterality Date  . CIRCUMCISION, NON-NEWBORN    . CYSTOSCOPY WITH STENT PLACEMENT Right 08/21/2015   Procedure: cystoscopy;  Surgeon: Nickie Retort, MD;  Location: [facility];  Service: Urology;  Laterality: Right;  . HOLEP-LASER ENUCLEATION OF THE PROSTATE WITH MORCELLATION N/A 10/23/2015   Procedure: HOLEP-LASER ENUCLEATION OF THE PROSTATE WITH MORCELLATION;  Surgeon: Hollice Espy, MD;  Location: [facility];  Service: Urology;  Laterality: N/A;    Home Medications:    Medication List       Accurate as of 11/21/15 10:50 AM. Always use your most recent med list.          amLODipine 10 MG tablet Commonly known as:  NORVASC Take 10 mg by mouth at bedtime.   atenolol 50 MG tablet Commonly known as:  TENORMIN Take 50 mg by mouth at bedtime.   docusate sodium 100 MG capsule Commonly known as:  COLACE Take 1 capsule (100 mg total) by mouth 2 (two) times daily.   docusate sodium 100 MG capsule Commonly known as:   COLACE Take 1 capsule (100 mg total) by mouth 2 (two) times daily.   finasteride 5 MG tablet Commonly known as:  PROSCAR Take 1 tablet (5 mg total) by mouth daily.   oxybutynin 5 MG tablet Commonly known as:  DITROPAN Take 1 tablet (5 mg total) by mouth every 8 (eight) hours as needed for bladder spasms.   oxyCODONE-acetaminophen 5-325 MG tablet Commonly known as:  PERCOCET Take 1-2 tablets by mouth every 4 (four) hours as needed for moderate pain or severe pain.   perphenazine-amitriptyline 4-25 MG Tabs tablet Commonly known as:  ETRAFON/TRIAVIL Take 1 tablet by mouth daily.       Allergies: No Known Allergies  Family History: Family History  Problem Relation Age of Onset  . Prostate cancer Neg Hx   . Bladder Cancer Father   . Bladder Cancer Paternal Grandmother     Social History:  reports that he has never smoked. He has never used smokeless tobacco. He reports that he does not drink alcohol or use drugs.  ROS: UROLOGY Frequent Urination?: No Hard to postpone urination?: No Burning/pain with urination?: No Get up at night to urinate?: No Leakage of urine?: No Urine stream starts and stops?: No Trouble starting stream?: No Do you have to strain to urinate?: No Blood in urine?: No Urinary tract infection?: No Sexually transmitted disease?: No Injury to kidneys or bladder?: No Painful intercourse?: No Weak stream?: No Erection  problems?: No Penile pain?: No  Gastrointestinal Nausea?: No Vomiting?: No Indigestion/heartburn?: No Diarrhea?: No Constipation?: No  Constitutional Fever: No Night sweats?: No Weight loss?: No Fatigue?: No  Skin Skin rash/lesions?: No Itching?: No  Eyes Blurred vision?: No Double vision?: No  Ears/Nose/Throat Sore throat?: No Sinus problems?: No  Hematologic/Lymphatic Swollen glands?: No Easy bruising?: No  Cardiovascular Leg swelling?: No Chest pain?: No  Respiratory Cough?: No Shortness of breath?:  No  Endocrine Excessive thirst?: No  Musculoskeletal Back pain?: No Joint pain?: No  Neurological Headaches?: No Dizziness?: No  Psychologic Depression?: No Anxiety?: No  Physical Exam: Ht 5\' 8"  (1.727 m)   Wt 172 lb 9.6 oz (78.3 kg)   BMI 26.24 kg/m   Constitutional:  Alert and oriented, No acute distress. HEENT: [place] AT, moist mucus membranes.  Trachea midline, no masses. Cardiovascular: No clubbing, cyanosis, or edema. Respiratory: Normal respiratory effort, no increased work of breathing. GI: Abdomen is soft, nontender, nondistended, no abdominal masses GU: No CVA tenderness.  Skin: No rashes, bruises or suspicious lesions. Lymph: No cervical or inguinal adenopathy. Neurologic: Grossly intact, no focal deficits, moving all 4 extremities. Psychiatric: Normal mood and affect.  Laboratory Data: Lab Results  Component Value Date   WBC 14.6 (H) 10/24/2015   HGB 7.8 (L) 10/24/2015   HCT 23.0 (L) 10/24/2015   MCV 90.1 10/24/2015   PLT 160 10/24/2015    Lab Results  Component Value Date   CREATININE 1.14 10/24/2015    Lab Results  Component Value Date   PSA 19.09 (H) 10/09/2015    No results found for: TESTOSTERONE  No results found for: HGBA1C  Urinalysis    Component Value Date/Time   COLORURINE YELLOW (A) 10/09/2015 1308   APPEARANCEUR TURBID (A) 10/09/2015 1308   APPEARANCEUR Cloudy (A) 08/19/2015 1420   LABSPEC 1.018 10/09/2015 1308   PHURINE 5.0 10/09/2015 1308   GLUCOSEU NEGATIVE 10/09/2015 1308   HGBUR 3+ (A) 10/09/2015 1308   BILIRUBINUR NEGATIVE 10/09/2015 1308   BILIRUBINUR Negative 08/19/2015 1420   KETONESUR NEGATIVE 10/09/2015 1308   PROTEINUR >500 (A) 10/09/2015 1308   NITRITE POSITIVE (A) 10/09/2015 1308   LEUKOCYTESUR 3+ (A) 10/09/2015 1308   LEUKOCYTESUR 2+ (A) 08/19/2015 1420      Assessment & Plan:    I discussed the risks, benefits, indications of cystoscopy with right ureteroscopy to assess his right ureteral filling  defect. He understands the risks include but are not limited to bleeding, infection, inability to access the right ureter, and injury to surrounding structures. He understands if there is a mass there that we will biopsy this mass and possibly ablated with the laser. He also understands he may need a right ureteral stent after the procedure. All questions were answered. The patient has elected to proceed.  1. Right ureteral filling defeft -Cystoscopy, right ureteroscopy, possible ureteral biopsy, possible laser ablation, possible right ureteral stent.   2. BPH Asymptomatic status post [place], MD  [facility] [address] [place], [place] [address] [phone]

## 2015-12-11 NOTE — OR Nursing (Signed)
1343  61ml  [place] 43% retrograde pylogram

## 2015-12-12 ENCOUNTER — Encounter: Payer: Self-pay | Admitting: Urology

## 2015-12-13 LAB — SURGICAL PATHOLOGY

## 2015-12-19 ENCOUNTER — Encounter: Payer: Self-pay | Admitting: Urology

## 2015-12-19 ENCOUNTER — Ambulatory Visit (INDEPENDENT_AMBULATORY_CARE_PROVIDER_SITE_OTHER): Payer: Medicare Other | Admitting: Urology

## 2015-12-19 VITALS — BP 144/85 | HR 61 | Ht 68.0 in | Wt 166.6 lb

## 2015-12-19 DIAGNOSIS — R9341 Abnormal radiologic findings on diagnostic imaging of renal pelvis, ureter, or bladder: Secondary | ICD-10-CM

## 2015-12-19 DIAGNOSIS — N400 Enlarged prostate without lower urinary tract symptoms: Secondary | ICD-10-CM

## 2015-12-19 DIAGNOSIS — D494 Neoplasm of unspecified behavior of bladder: Secondary | ICD-10-CM

## 2015-12-19 LAB — URINALYSIS, COMPLETE

## 2015-12-19 LAB — MICROSCOPIC EXAMINATION
Bacteria, UA: NONE SEEN
Epithelial Cells (non renal): NONE SEEN /hpf (ref 0–10)
RBC, UA: >30 /hpf — AB (ref 0–?)
WBC, UA: NONE SEEN /hpf (ref 0–?)

## 2015-12-19 NOTE — Progress Notes (Signed)
12/19/2015 1:58 PM   Aaron Stokes [birth year] [id number]  Referring provider: Ricke Hey, MD [place], [place] [address]  Chief Complaint  Patient presents with  . Cysto Stent Removal    HPI: The patient is 75 year old gentleman with a history of BPH with urinary retention status post HOLEP and right ureteral filling defect presents today after evaluation of his right ureteral filling defect. He also had a lesion in his bladder in the posterior wall that was resected. This was negative for malignancy. 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 On ureteroscopy, his right ureter was unremarkable. Attempts at biopsying his ureter in the suspected region of the filling defect did not produce adequate tissue for pathological evaluation. His ureter however was unremarkable with no masses.  He is here for right ureteral stent removal however his urine is concerning for infection.  PMH: Past Medical History:  Diagnosis Date  . Acid reflux   . Anxiety   . Chronic kidney disease    UTI; hematuria  . Hypertension   . Insomnia   . Mentally challenged     Surgical History: Past Surgical History:  Procedure Laterality Date  . CIRCUMCISION, NON-NEWBORN    . CYSTOSCOPY WITH STENT PLACEMENT Right 08/21/2015   Procedure: cystoscopy;  Surgeon: Nickie Retort, MD;  Location: [facility];  Service: Urology;  Laterality: Right;  . CYSTOSCOPY WITH STENT PLACEMENT Right 12/11/2015   Procedure: CYSTOSCOPY WITH STENT PLACEMENT;  Surgeon: Nickie Retort, MD;  Location: [facility];  Service: Urology;  Laterality: Right;  . HOLEP-LASER ENUCLEATION OF THE PROSTATE WITH MORCELLATION N/A 10/23/2015   Procedure: HOLEP-LASER ENUCLEATION OF THE PROSTATE WITH MORCELLATION;  Surgeon: Hollice Espy, MD;  Location: [facility];  Service: Urology;  Laterality: N/A;  . TRANSURETHRAL RESECTION OF BLADDER TUMOR  12/11/2015   Procedure: TRANSURETHRAL RESECTION OF BLADDER TUMOR (TURBT);   Surgeon: Nickie Retort, MD;  Location: [facility];  Service: Urology;;  . URETEROSCOPY WITH HOLMIUM LASER LITHOTRIPSY Right 12/11/2015   Procedure: URETEROSCOPY with biopsy;  Surgeon: Nickie Retort, MD;  Location: [facility];  Service: Urology;  Laterality: Right;    Home Medications:    Medication List       Accurate as of 12/19/15  1:58 PM. Always use your most recent med list.          amLODipine 10 MG tablet Commonly known as:  NORVASC Take 10 mg by mouth at bedtime.   atenolol 50 MG tablet Commonly known as:  TENORMIN Take 50 mg by mouth at bedtime.   cephALEXin 500 MG capsule Commonly known as:  KEFLEX Take 1 capsule (500 mg total) by mouth 3 (three) times daily.   docusate sodium 100 MG capsule Commonly known as:  COLACE Take 1 capsule (100 mg total) by mouth 2 (two) times daily.   finasteride 5 MG tablet Commonly known as:  PROSCAR Take 1 tablet (5 mg total) by mouth daily.   HYDROcodone-acetaminophen 5-325 MG tablet Commonly known as:  NORCO Take 1 tablet by mouth every 4 (four) hours as needed for moderate pain.   oxybutynin 5 MG tablet Commonly known as:  DITROPAN Take 1 tablet (5 mg total) by mouth every 8 (eight) hours as needed for bladder spasms.   perphenazine-amitriptyline 4-25 MG Tabs tablet Commonly known as:  ETRAFON/TRIAVIL Take 1 tablet by mouth at bedtime.       Allergies: No Known Allergies  Family History: Family History  Problem Relation Age of Onset  .  Bladder Cancer Father   . Bladder Cancer Paternal Grandmother   . Prostate cancer Neg Hx     Social History:  reports that he has never smoked. He has never used smokeless tobacco. He reports that he does not drink alcohol or use drugs.  ROS:                                        Physical Exam: BP (!) 144/85   Pulse 61   Ht 5\' 8"  (1.727 m)   Wt 166 lb 9.6 oz (75.6 kg)   BMI 25.33 kg/m   Constitutional:  Alert and oriented, No acute  distress. HEENT: [place] AT, moist mucus membranes.  Trachea midline, no masses. Cardiovascular: No clubbing, cyanosis, or edema. Respiratory: Normal respiratory effort, no increased work of breathing. GI: Abdomen is soft, nontender, nondistended, no abdominal masses GU: No CVA tenderness.  Skin: No rashes, bruises or suspicious lesions. Lymph: No cervical or inguinal adenopathy. Neurologic: Grossly intact, no focal deficits, moving all 4 extremities. Psychiatric: Normal mood and affect.  Laboratory Data: Lab Results  Component Value Date   WBC 4.1 12/05/2015   HGB 12.0 (L) 12/05/2015   HCT 35.7 (L) 12/05/2015   MCV 90.8 12/05/2015   PLT 180 12/05/2015    Lab Results  Component Value Date   CREATININE 1.18 12/05/2015    Lab Results  Component Value Date   PSA 19.09 (H) 10/09/2015    No results found for: TESTOSTERONE  No results found for: HGBA1C  Urinalysis    Component Value Date/Time   COLORURINE YELLOW (A) 10/09/2015 1308   APPEARANCEUR TURBID (A) 10/09/2015 1308   APPEARANCEUR Cloudy (A) 08/19/2015 1420   LABSPEC 1.018 10/09/2015 1308   PHURINE 5.0 10/09/2015 1308   GLUCOSEU NEGATIVE 10/09/2015 1308   HGBUR 3+ (A) 10/09/2015 1308   BILIRUBINUR NEGATIVE 10/09/2015 1308   BILIRUBINUR Negative 08/19/2015 1420   KETONESUR NEGATIVE 10/09/2015 1308   PROTEINUR >500 (A) 10/09/2015 1308   NITRITE POSITIVE (A) 10/09/2015 1308   LEUKOCYTESUR 3+ (A) 10/09/2015 1308   LEUKOCYTESUR 2+ (A) 08/19/2015 1420     Assessment & Plan:    1. Right ureteral filling defect Negative workup. No malignancy or mass and rectal visualization. He still has a right ureteral stent that will need to be removed.   2. Possible UTI We'll check urine culture and treat as appropriate prior to stent removal above.   3. BPH with history of urinary retention Resolved status post HoLEP  4. Bladder mass TURBT negative for malignancy.  Return in about 2 weeks (around 01/02/2016) for  cystoscopy/stent removal.  Nickie Retort, MD  [facility] [address] [place], [place] [address] [phone]

## 2015-12-22 LAB — CULTURE, URINE COMPREHENSIVE

## 2016-01-01 ENCOUNTER — Ambulatory Visit (INDEPENDENT_AMBULATORY_CARE_PROVIDER_SITE_OTHER): Payer: Medicare Other | Admitting: Urology

## 2016-01-01 ENCOUNTER — Encounter: Payer: Self-pay | Admitting: Urology

## 2016-01-01 VITALS — BP 147/67 | HR 48 | Ht 68.0 in | Wt 163.7 lb

## 2016-01-01 DIAGNOSIS — D494 Neoplasm of unspecified behavior of bladder: Secondary | ICD-10-CM | POA: Diagnosis not present

## 2016-01-01 DIAGNOSIS — N400 Enlarged prostate without lower urinary tract symptoms: Secondary | ICD-10-CM

## 2016-01-01 DIAGNOSIS — R9341 Abnormal radiologic findings on diagnostic imaging of renal pelvis, ureter, or bladder: Secondary | ICD-10-CM

## 2016-01-01 MED ORDER — CIPROFLOXACIN HCL 500 MG PO TABS
500.0000 mg | ORAL_TABLET | Freq: Once | ORAL | Status: AC
  Administered 2016-01-01: 500 mg via ORAL

## 2016-01-01 MED ORDER — LIDOCAINE HCL 2 % EX GEL
1.0000 "application " | Freq: Once | CUTANEOUS | Status: AC
  Administered 2016-01-01: 1 via URETHRAL

## 2016-01-01 NOTE — Progress Notes (Signed)
01/01/2016 10:59 AM   Aaron Stokes [birth year] [id number]  Referring provider: Ricke Hey, MD [place], [place] [address]  Chief Complaint  Patient presents with  . Cysto Stent Removal     HPI: The patient is 76 year old gentleman with a history of BPH with urinary retention status post HOLEP and right ureteral filling defect presents today after evaluation of his right ureteral filling defect. He also had a lesion in his bladder in the posterior wall that was resected. This was negative for malignancy. 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 On ureteroscopy, his right ureter was unremarkable. Attempts at biopsying his ureter in the suspected region of the filling defect did not produce adequate tissue for pathological evaluation. His ureter however was unremarkable with no masses.     He presents today for stent removal.   PMH: Past Medical History:  Diagnosis Date  . Acid reflux   . Anxiety   . Chronic kidney disease    UTI; hematuria  . Hypertension   . Insomnia   . Mentally challenged     Surgical History: Past Surgical History:  Procedure Laterality Date  . CIRCUMCISION, NON-NEWBORN    . CYSTOSCOPY WITH STENT PLACEMENT Right 08/21/2015   Procedure: cystoscopy;  Surgeon: Nickie Retort, MD;  Location: [facility];  Service: Urology;  Laterality: Right;  . CYSTOSCOPY WITH STENT PLACEMENT Right 12/11/2015   Procedure: CYSTOSCOPY WITH STENT PLACEMENT;  Surgeon: Nickie Retort, MD;  Location: [facility];  Service: Urology;  Laterality: Right;  . HOLEP-LASER ENUCLEATION OF THE PROSTATE WITH MORCELLATION N/A 10/23/2015   Procedure: HOLEP-LASER ENUCLEATION OF THE PROSTATE WITH MORCELLATION;  Surgeon: Hollice Espy, MD;  Location: [facility];  Service: Urology;  Laterality: N/A;  . TRANSURETHRAL RESECTION OF BLADDER TUMOR  12/11/2015   Procedure: TRANSURETHRAL RESECTION OF BLADDER TUMOR (TURBT);  Surgeon: Nickie Retort, MD;  Location: [facility];   Service: Urology;;  . URETEROSCOPY WITH HOLMIUM LASER LITHOTRIPSY Right 12/11/2015   Procedure: URETEROSCOPY with biopsy;  Surgeon: Nickie Retort, MD;  Location: [facility];  Service: Urology;  Laterality: Right;    Home Medications:    Medication List       Accurate as of 01/01/16 10:59 AM. Always use your most recent med list.          amLODipine 10 MG tablet Commonly known as:  NORVASC Take 10 mg by mouth at bedtime.   atenolol 50 MG tablet Commonly known as:  TENORMIN Take 50 mg by mouth at bedtime.   cephALEXin 500 MG capsule Commonly known as:  KEFLEX Take 1 capsule (500 mg total) by mouth 3 (three) times daily.   docusate sodium 100 MG capsule Commonly known as:  COLACE Take 1 capsule (100 mg total) by mouth 2 (two) times daily.   finasteride 5 MG tablet Commonly known as:  PROSCAR Take 1 tablet (5 mg total) by mouth daily.   HYDROcodone-acetaminophen 5-325 MG tablet Commonly known as:  NORCO Take 1 tablet by mouth every 4 (four) hours as needed for moderate pain.   oxybutynin 5 MG tablet Commonly known as:  DITROPAN Take 1 tablet (5 mg total) by mouth every 8 (eight) hours as needed for bladder spasms.   perphenazine-amitriptyline 4-25 MG Tabs tablet Commonly known as:  ETRAFON/TRIAVIL Take 1 tablet by mouth at bedtime.       Allergies: No Known Allergies  Family History: Family History  Problem Relation Age of Onset  . Bladder Cancer Father   .  Bladder Cancer Paternal Grandmother   . Prostate cancer Neg Hx     Social History:  reports that he has never smoked. He has never used smokeless tobacco. He reports that he does not drink alcohol or use drugs.  ROS:                                        Physical Exam: BP (!) 147/67   Pulse (!) 48   Ht 5\' 8"  (1.727 m)   Wt 163 lb 11.2 oz (74.3 kg)   BMI 24.89 kg/m   Constitutional:  Alert and oriented, No acute distress. HEENT: [place] AT, moist mucus membranes.  Trachea  midline, no masses. Cardiovascular: No clubbing, cyanosis, or edema. Respiratory: Normal respiratory effort, no increased work of breathing. GI: Abdomen is soft, nontender, nondistended, no abdominal masses GU: No CVA tenderness.  Skin: No rashes, bruises or suspicious lesions. Lymph: No cervical or inguinal adenopathy. Neurologic: Grossly intact, no focal deficits, moving all 4 extremities. Psychiatric: Normal mood and affect.  Laboratory Data: Lab Results  Component Value Date   WBC 4.1 12/05/2015   HGB 12.0 (L) 12/05/2015   HCT 35.7 (L) 12/05/2015   MCV 90.8 12/05/2015   PLT 180 12/05/2015    Lab Results  Component Value Date   CREATININE 1.18 12/05/2015    Lab Results  Component Value Date   PSA 19.09 (H) 10/09/2015    No results found for: TESTOSTERONE  No results found for: HGBA1C  Urinalysis    Component Value Date/Time   COLORURINE YELLOW (A) 10/09/2015 1308   APPEARANCEUR Cloudy (A) 12/19/2015 1329   LABSPEC 1.018 10/09/2015 1308   PHURINE 5.0 10/09/2015 1308   GLUCOSEU CANCELED 12/19/2015 1329   HGBUR 3+ (A) 10/09/2015 1308   BILIRUBINUR NEGATIVE 10/09/2015 1308   BILIRUBINUR Negative 08/19/2015 1420   KETONESUR NEGATIVE 10/09/2015 1308   PROTEINUR CANCELED 12/19/2015 1329   PROTEINUR >500 (A) 10/09/2015 1308   NITRITE POSITIVE (A) 10/09/2015 1308   LEUKOCYTESUR 3+ (A) 10/09/2015 1308   LEUKOCYTESUR 2+ (A) 08/19/2015 1420    Cystoscopy Procedure Note  Patient identification was confirmed, informed consent was obtained, and patient was prepped using Betadine solution.  Lidocaine jelly was administered per urethral meatus.    Preoperative abx where received prior to procedure.     Pre-Procedure: - Inspection reveals a normal caliber ureteral meatus.  Procedure: The flexible cystoscope was introduced without difficulty - No urethral strictures/lesions are present Right ureteral stent removed intact with flexible graspers   Post-Procedure: -  Patient tolerated the procedure well  Assessment & Plan:    1. Right ureteral filling defect Negative workup. No malignancy or mass and rectal visualization. He still has a right ureteral stent that will need to be removed.   2. BPH with history of urinary retention Resolved status post HoLEP  3. Bladder mass TURBT negative for malignancy.  Return in about 1 year (around 12/31/2016).  Nickie Retort, MD  [facility] [address], [place], [place] [address] [phone]

## 2016-12-31 ENCOUNTER — Ambulatory Visit: Payer: Medicare Other

## 2017-01-07 ENCOUNTER — Ambulatory Visit (INDEPENDENT_AMBULATORY_CARE_PROVIDER_SITE_OTHER): Payer: Medicare Other | Admitting: Urology

## 2017-01-07 ENCOUNTER — Encounter: Payer: Self-pay | Admitting: Urology

## 2017-01-07 VITALS — BP 129/71 | HR 57 | Ht 68.0 in | Wt 210.9 lb

## 2017-01-07 DIAGNOSIS — N400 Enlarged prostate without lower urinary tract symptoms: Secondary | ICD-10-CM

## 2017-01-07 NOTE — Progress Notes (Signed)
01/07/2017 11:21 AM   Aaron Stokes 11-23-1940 [id number]  Referring provider: Ricke Hey, MD [place], [place] [address]  Chief Complaint  Patient presents with  . 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 He is doing great this time. He has nocturia 1-2 times per night. He feels like he empties his bladder. He does not strain to urinate. He has no urgency or frequency. He denies hesitancy. He has done very well since his procedure. He denies any hematuria, kidney stones, UTI in the last year.  He also has a history of a right ureteral filling defect with negative workup on direct visualization. 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   PMH: Past Medical History:  Diagnosis Date  . Acid reflux   . Anxiety   . Chronic kidney disease    UTI; hematuria  . Hypertension   . Insomnia   . Mentally challenged     Surgical History: Past Surgical History:  Procedure Laterality Date  . CIRCUMCISION, NON-NEWBORN    . CYSTOSCOPY WITH STENT PLACEMENT Right 08/21/2015   Procedure: cystoscopy;  Surgeon: Nickie Retort, MD;  Location: [facility];  Service: Urology;  Laterality: Right;  . CYSTOSCOPY WITH STENT PLACEMENT Right 12/11/2015   Procedure: CYSTOSCOPY WITH STENT PLACEMENT;  Surgeon: Nickie Retort, MD;  Location: [facility];  Service: Urology;  Laterality: Right;  . HOLEP-LASER ENUCLEATION OF THE PROSTATE WITH MORCELLATION N/A 10/23/2015   Procedure: HOLEP-LASER ENUCLEATION OF THE PROSTATE WITH MORCELLATION;  Surgeon: Hollice Espy, MD;  Location: [facility];  Service: Urology;  Laterality: N/A;  . TRANSURETHRAL RESECTION OF BLADDER TUMOR  12/11/2015   Procedure: TRANSURETHRAL RESECTION OF BLADDER TUMOR (TURBT);  Surgeon: Nickie Retort,  MD;  Location: [facility];  Service: Urology;;  . URETEROSCOPY WITH HOLMIUM LASER LITHOTRIPSY Right 12/11/2015   Procedure: URETEROSCOPY with biopsy;  Surgeon: Nickie Retort, MD;  Location: [facility];  Service: Urology;  Laterality: Right;    Home Medications:  Allergies as of 01/07/2017   No Known Allergies     Medication List       Accurate as of 01/07/17 11:20 AM. Always use your most recent med list.          amLODipine 10 MG tablet Commonly known as:  NORVASC Take 10 mg by mouth at bedtime.   atenolol 50 MG tablet Commonly known as:  TENORMIN Take 50 mg by mouth at bedtime.   cephALEXin 500 MG capsule Commonly known as:  KEFLEX Take 1 capsule (500 mg total) by mouth 3 (three) times daily.   docusate sodium 100 MG capsule Commonly known as:  COLACE Take 1 capsule (100 mg total) by mouth 2 (two) times daily.   finasteride 5 MG tablet Commonly known as:  PROSCAR Take 1 tablet (5 mg total) by mouth daily.   oxybutynin 5 MG tablet Commonly known as:  DITROPAN Take 1 tablet (5 mg total) by mouth every 8 (eight) hours as needed for bladder spasms.   perphenazine-amitriptyline 4-25 MG Tabs tablet Commonly known as:  ETRAFON/TRIAVIL Take 1 tablet by mouth at bedtime.       Allergies: No Known Allergies  Family History: Family History  Problem Relation Age of Onset  . Bladder Cancer Father   . Bladder Cancer  Paternal Grandmother   . Prostate cancer Neg Hx     Social History:  reports that he has never smoked. He has never used smokeless tobacco. He reports that he does not drink alcohol or use drugs.  ROS: UROLOGY Frequent Urination?: No Hard to postpone urination?: No Burning/pain with urination?: No Get up at night to urinate?: No Leakage of urine?: No Urine stream starts and stops?: No Trouble starting stream?: No Do you have to strain to urinate?: No Blood in urine?: No Urinary tract infection?: No Sexually transmitted disease?: No Injury to  kidneys or bladder?: No Painful intercourse?: No Weak stream?: No Erection problems?: No Penile pain?: No  Gastrointestinal Nausea?: No Vomiting?: No Indigestion/heartburn?: No Diarrhea?: No Constipation?: No  Constitutional Fever: No Night sweats?: No Weight loss?: No Fatigue?: No  Skin Skin rash/lesions?: No Itching?: No  Eyes Blurred vision?: No Double vision?: No  Ears/Nose/Throat Sore throat?: No Sinus problems?: No  Hematologic/Lymphatic Swollen glands?: No Easy bruising?: No  Cardiovascular Leg swelling?: No Chest pain?: No  Respiratory Cough?: No Shortness of breath?: No  Endocrine Excessive thirst?: No  Musculoskeletal Back pain?: No Joint pain?: No  Neurological Headaches?: No Dizziness?: No  Psychologic Depression?: No Anxiety?: No  Physical Exam: BP 129/71 (BP Location: Right Arm, Patient Position: Sitting, Cuff Size: Normal)   Pulse (!) 57   Ht 5\' 8"  (1.727 m)   Wt 210 lb 14.4 oz (95.7 kg)   BMI 32.07 kg/m   Constitutional:  Alert and oriented, No acute distress. HEENT:  AT, moist mucus membranes.  Trachea midline, no masses. Cardiovascular: No clubbing, cyanosis, or edema. Respiratory: Normal respiratory effort, no increased work of breathing. GI: Abdomen is soft, nontender, nondistended, no abdominal masses GU: No CVA tenderness.  Skin: No rashes, bruises or suspicious lesions. Lymph: No cervical or inguinal adenopathy. Neurologic: Grossly intact, no focal deficits, moving all 4 extremities. Psychiatric: Normal mood and affect.  Laboratory Data: Lab Results  Component Value Date   WBC 4.1 12/05/2015   HGB 12.0 (L) 12/05/2015   HCT 35.7 (L) 12/05/2015   MCV 90.8 12/05/2015   PLT 180 12/05/2015    Lab Results  Component Value Date   CREATININE 1.18 12/05/2015    Lab Results  Component Value Date   PSA 19.09 (H) 10/09/2015    No results found for: TESTOSTERONE  No results found for: HGBA1C  Urinalysis     Component Value Date/Time   COLORURINE YELLOW (A) 10/09/2015 1308   APPEARANCEUR Cloudy (A) 12/19/2015 1329   LABSPEC 1.018 10/09/2015 1308   PHURINE 5.0 10/09/2015 1308   GLUCOSEU CANCELED 12/19/2015 1329   HGBUR 3+ (A) 10/09/2015 1308   BILIRUBINUR NEGATIVE 10/09/2015 1308   BILIRUBINUR Negative 08/19/2015 1420   KETONESUR NEGATIVE 10/09/2015 1308   PROTEINUR CANCELED 12/19/2015 1329   PROTEINUR >500 (A) 10/09/2015 1308   NITRITE POSITIVE (A) 10/09/2015 1308   LEUKOCYTESUR 3+ (A) 10/09/2015 1308   LEUKOCYTESUR 2+ (A) 08/19/2015 1420    Assessment & Plan:    1. BPH The patient is doing great since his HoLEP procedure. I discussed with him that he probably needs to only follow up as needed. He however is requesting to continue annual follow-up. We will schedule him for an appointment one year to see how he is doing.  Return in about 1 year (around 01/07/2018).  Nickie Retort, MD  [facility] [address] [place],  [address] [phone]

## 2017-01-20 ENCOUNTER — Other Ambulatory Visit: Payer: Self-pay | Admitting: Urology

## 2017-06-18 ENCOUNTER — Other Ambulatory Visit: Payer: Self-pay

## 2017-06-18 ENCOUNTER — Emergency Department
Admission: EM | Admit: 2017-06-18 | Discharge: 2017-06-18 | Disposition: A | Discharge status: Home / Self Care | Payer: Medicare HMO | Attending: Emergency Medicine | Admitting: Emergency Medicine

## 2017-06-18 ENCOUNTER — Encounter: Payer: Self-pay | Admitting: Emergency Medicine

## 2017-06-18 DIAGNOSIS — Z79899 Other long term (current) drug therapy: Secondary | ICD-10-CM | POA: Diagnosis not present

## 2017-06-18 DIAGNOSIS — N189 Chronic kidney disease, unspecified: Secondary | ICD-10-CM | POA: Insufficient documentation

## 2017-06-18 DIAGNOSIS — I129 Hypertensive chronic kidney disease with stage 1 through stage 4 chronic kidney disease, or unspecified chronic kidney disease: Secondary | ICD-10-CM | POA: Insufficient documentation

## 2017-06-18 DIAGNOSIS — Z760 Encounter for issue of repeat prescription: Secondary | ICD-10-CM | POA: Insufficient documentation

## 2017-06-18 MED ORDER — ATENOLOL 50 MG PO TABS: 50.0000 mg | ORAL_TABLET | Freq: Every day | ORAL | 0 refills | Status: DC

## 2017-06-18 MED ORDER — PERPHENAZINE-AMITRIPTYLINE 4-25 MG PO TABS: 1.0000 | ORAL_TABLET | Freq: Every day | ORAL | 0 refills | Status: DC

## 2017-06-18 MED ORDER — AMLODIPINE BESYLATE 10 MG PO TABS: 10.0000 mg | ORAL_TABLET | Freq: Every day | ORAL | 0 refills | Status: DC

## 2017-06-18 NOTE — ED Notes (Addendum)
See triage note  States he ran out of atenolol 50 mg,amlodpine 10 mg and ETRAFON-TRIAVIL 4/25

## 2017-06-18 NOTE — ED Triage Notes (Signed)
Pt reports ran out of his medication.  Here today because needs refill.  pts PCP had license revoked and they have appt with new doctor 07/05/17 but needs medications to get through until then. Has empty bottles with him. No other complaints. ambulatory.

## 2017-06-18 NOTE — ED Provider Notes (Signed)
[facility] Emergency Department Provider Note  ____________________________________________  Time seen: Approximately 3:18 PM  I have reviewed the triage vital signs and the nursing notes.   HISTORY  Chief Complaint Medication Refill   HPI Aaron Stokes is a 77 y.o. male who presents to the emergency department for medication refill.  Patient states that his physician "lost his license."  Patient states that he has an appointment scheduled with a new PCP for March 18, but is out of his medications and is concerned that he will begin to have side effects.  Past Medical History:  Diagnosis Date  . Acid reflux   . Anxiety   . Chronic kidney disease    UTI; hematuria  . Hypertension   . Insomnia   . Mentally challenged     Patient Active Problem List   Diagnosis Date Noted  . BPH (benign prostatic hypertrophy) with urinary retention 10/23/2015  . Foley catheter problem ([place])   . Hematuria   . Acute urinary retention 11/26/2014  . Hypertension 11/26/2014  . Anxiety 11/26/2014  . Insomnia 11/26/2014    Past Surgical History:  Procedure Laterality Date  . CIRCUMCISION, NON-NEWBORN    . CYSTOSCOPY WITH STENT PLACEMENT Right 08/21/2015   Procedure: cystoscopy;  Surgeon: Nickie Retort, MD;  Location: [facility];  Service: Urology;  Laterality: Right;  . CYSTOSCOPY WITH STENT PLACEMENT Right 12/11/2015   Procedure: CYSTOSCOPY WITH STENT PLACEMENT;  Surgeon: Nickie Retort, MD;  Location: [facility];  Service: Urology;  Laterality: Right;  . HOLEP-LASER ENUCLEATION OF THE PROSTATE WITH MORCELLATION N/A 10/23/2015   Procedure: HOLEP-LASER ENUCLEATION OF THE PROSTATE WITH MORCELLATION;  Surgeon: Hollice Espy, MD;  Location: [facility];  Service: Urology;  Laterality: N/A;  . TRANSURETHRAL RESECTION OF BLADDER TUMOR  12/11/2015   Procedure: TRANSURETHRAL RESECTION OF BLADDER TUMOR (TURBT);  Surgeon: Nickie Retort, MD;  Location: [facility];  Service:  Urology;;  . URETEROSCOPY WITH HOLMIUM LASER LITHOTRIPSY Right 12/11/2015   Procedure: URETEROSCOPY with biopsy;  Surgeon: Nickie Retort, MD;  Location: [facility];  Service: Urology;  Laterality: Right;    Prior to Admission medications   Medication Sig Start Date End Date Taking? Authorizing Provider  amLODipine (NORVASC) 10 MG tablet Take 1 tablet (10 mg total) by mouth at bedtime. 06/18/17   Milan Perkins B, FNP  atenolol (TENORMIN) 50 MG tablet Take 1 tablet (50 mg total) by mouth at bedtime. 06/18/17   Edlin Ford B, FNP  cephALEXin (KEFLEX) 500 MG capsule Take 1 capsule (500 mg total) by mouth 3 (three) times daily. Patient not taking: Reported on 01/01/2016 12/11/15   Nickie Retort, MD  docusate sodium (COLACE) 100 MG capsule Take 1 capsule (100 mg total) by mouth 2 (two) times daily. Patient not taking: Reported on 01/07/2017 10/23/15   Hollice Espy, MD  finasteride (PROSCAR) 5 MG tablet take 1 tablet by mouth once daily 01/21/17   Zara Council A, PA-C  oxybutynin (DITROPAN) 5 MG tablet Take 1 tablet (5 mg total) by mouth every 8 (eight) hours as needed for bladder spasms. Patient not taking: Reported on 01/07/2017 10/24/15   Zara Council A, PA-C  perphenazine-amitriptyline (ETRAFON/TRIAVIL) 4-25 MG TABS tablet Take 1 tablet by mouth at bedtime. 06/18/17   Victorino Dike, FNP    Allergies Patient has no known allergies.  Family History  Problem Relation Age of Onset  . Bladder Cancer Father   . Bladder Cancer Paternal Grandmother   . Prostate cancer Neg  Hx     Social History Social History   Tobacco Use  . Smoking status: Never Smoker  . Smokeless tobacco: Never Used  Substance Use Topics  . Alcohol use: No    Alcohol/week: 0.0 oz  . Drug use: No    Review of Systems Constitutional: Negative for fever. ENT: Negative for sore throat. Respiratory: Negative for cough Gastrointestinal: No abdominal pain.  No nausea, no vomiting.  No diarrhea.   Musculoskeletal: Negative for generalized body aches. Skin: Negative for rash/lesion/wound. Neurological: Negative for headaches, focal weakness or numbness.  ____________________________________________   PHYSICAL EXAM:  VITAL SIGNS: ED Triage Vitals  Enc Vitals Group     BP 06/18/17 1432 (!) 143/66     Pulse Rate 06/18/17 1432 71     Resp 06/18/17 1432 18     Temp 06/18/17 1432 97.9 F (36.6 C)     Temp Source 06/18/17 1432 Oral     SpO2 06/18/17 1432 99 %     Weight 06/18/17 1427 210 lb (95.3 kg)     Height 06/18/17 1427 5\' 10"  (1.778 m)     Head Circumference --      Peak Flow --      Pain Score --      Pain Loc --      Pain Edu? --      Excl. in [place]? --     Constitutional: Alert and oriented. Well appearing and in no acute distress. Eyes: Conjunctivae are normal. PERRL. EOMI. Head: Atraumatic. Nose: No congestion/rhinnorhea. Mouth/Throat: Mucous membranes are moist. Neck: No stridor.  Cardiovascular: Normal rate, regular rhythm. Good peripheral circulation. Respiratory: Normal respiratory effort. Musculoskeletal: Full ROM throughout.  Neurologic:  Normal speech and language. No gross focal neurologic deficits are appreciated. Speech is normal. No gait instability. Skin:  Skin is warm, dry and intact. No rash noted. Psychiatric: Mood and affect are normal. Speech and behavior are normal.  ____________________________________________   LABS (all labs ordered are listed, but only abnormal results are displayed)  Labs Reviewed - No data to display ____________________________________________  EKG  Not indicated ____________________________________________  RADIOLOGY  Not indicated ____________________________________________   PROCEDURES  ____________________________________________   INITIAL IMPRESSION / ASSESSMENT AND PLAN / ED COURSE     Pertinent labs & imaging results that were available during my care of the patient were reviewed by me and  considered in my medical decision making (see chart for details).  77 year old male presenting to the emergency department for medication refill.  Prescriptions were refilled and the patient is to keep his appointment on March 18.  He was instructed to return to the the emergency department for symptoms of concern if unable to schedule an earlier appointment.  ____________________________________________   FINAL CLINICAL IMPRESSION(S) / ED DIAGNOSES  Final diagnoses:  Medication refill       Victorino Dike, FNP 06/18/17 1522    Harvest Dark, MD 06/18/17 1523

## 2017-06-19 IMAGING — CT CT ABD-PEL WO/W CM
2 of 4 series · 12 of 32 positions shown, 17 images · IV contrast (APPLIED)
Comparison: None.

CLINICAL DATA: Microscopic hematuria. Recurrent urinary tract
infection. BPH. Urinary retention requiring Foley drainage.

EXAM:
CT ABDOMEN AND PELVIS WITHOUT AND WITH CONTRAST
TECHNIQUE: Multidetector CT imaging of the abdomen and pelvis was performed
following the standard protocol before and following the bolus
administration of intravenous contrast.
CONTRAST:  125mL NE9O2U-0AA IOPAMIDOL (NE9O2U-0AA) INJECTION 61%

[Series 2: axial pre · axial · non-contrast · 0.77mm/px · z∈[-967,-612]mm · 8 of 93 slices shown, 13 images]
[im 11/93  soft-tissue]
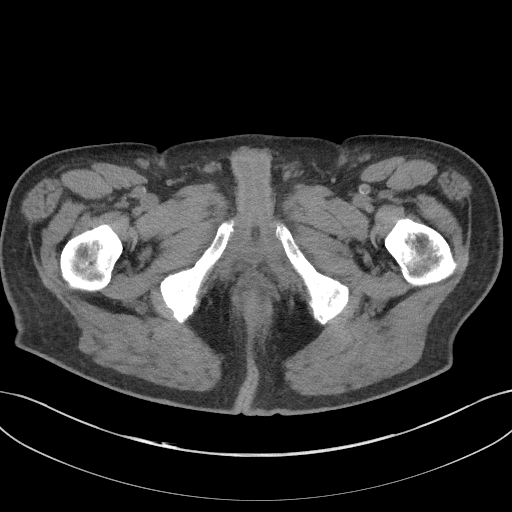
[im 11/93  bone]
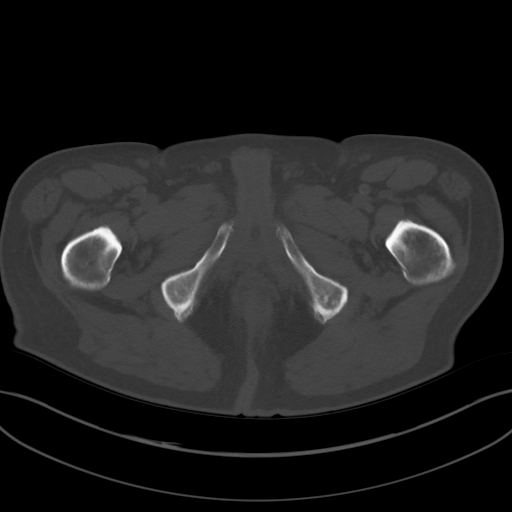
[im 21/93  soft-tissue]
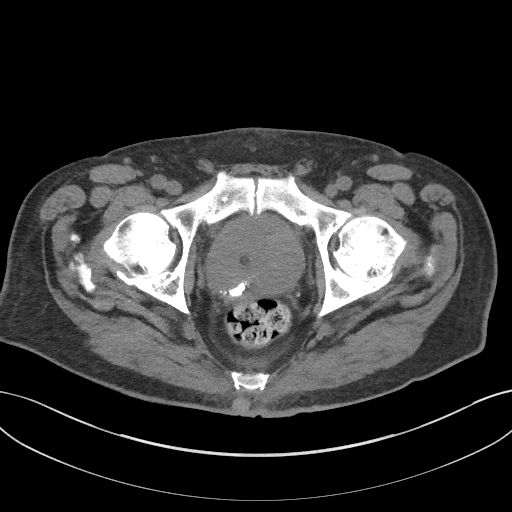
[im 31/93  soft-tissue]
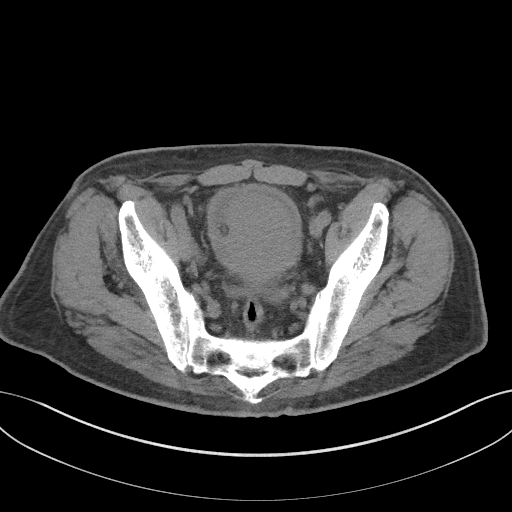
[im 41/93  soft-tissue]
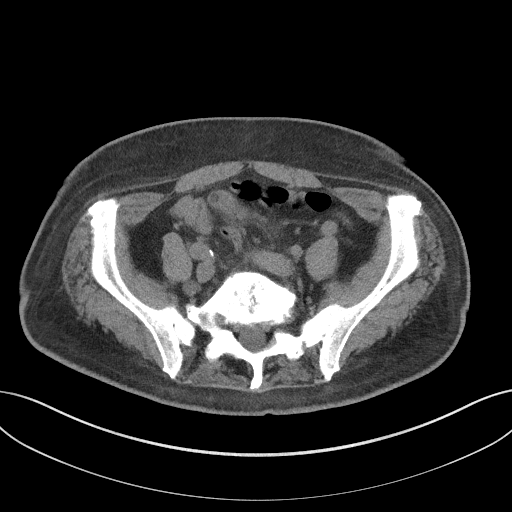
[im 52/93  soft-tissue]
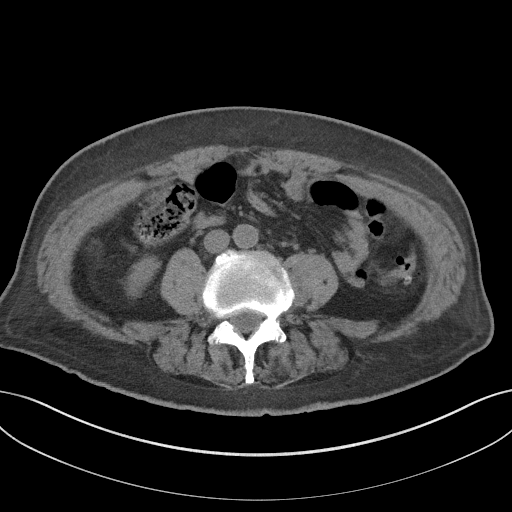
[im 52/93  lung]
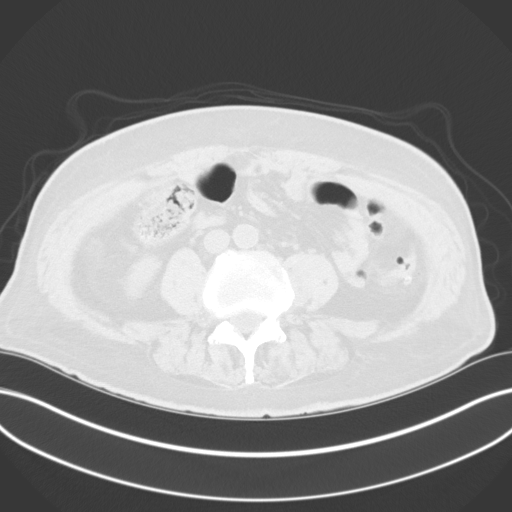
[im 62/93  soft-tissue]
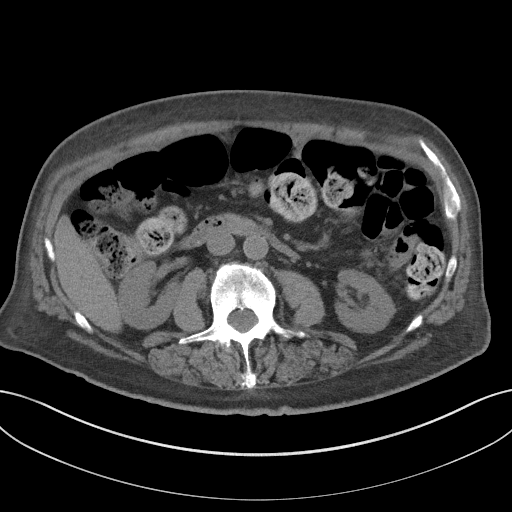
[im 62/93  lung]
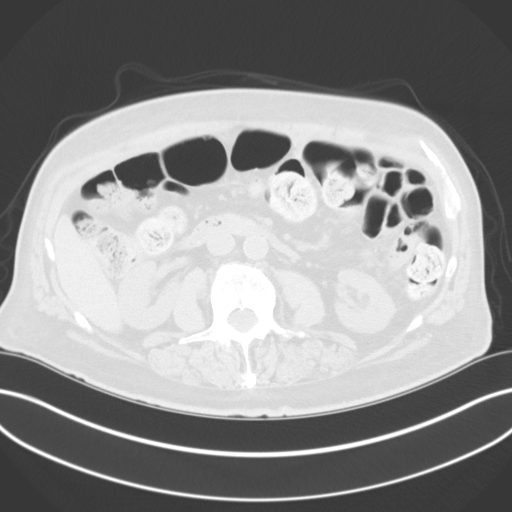
[im 72/93  soft-tissue]
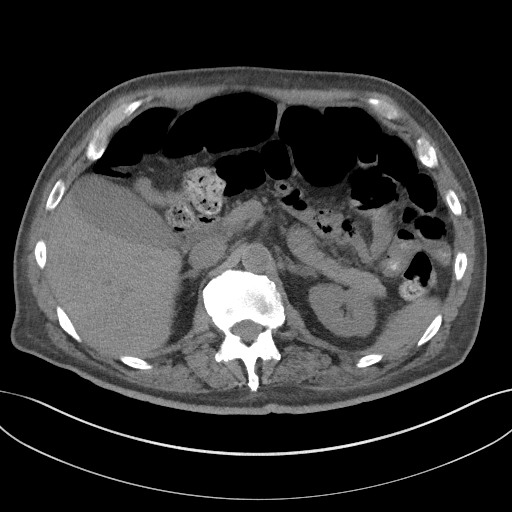
[im 72/93  lung]
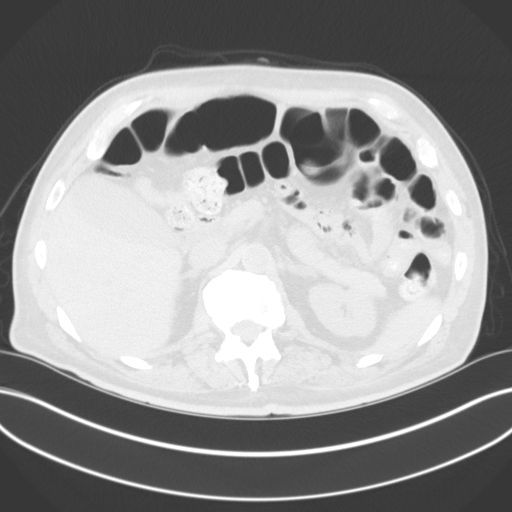
[im 82/93  soft-tissue]
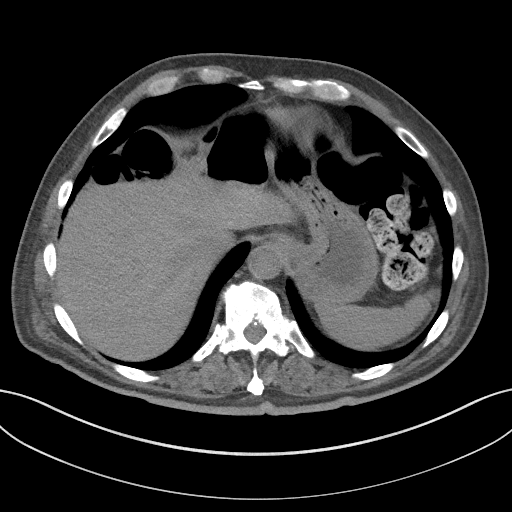
[im 82/93  lung]
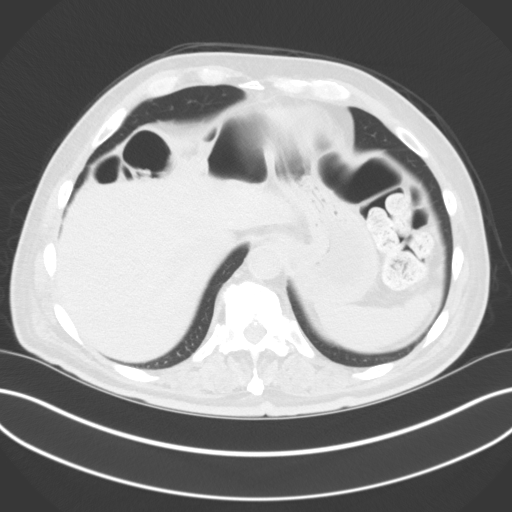

[Series 4: axial post · axial · 0.77mm/px · z∈[-967,-817]mm · 4 of 93 slices shown]
[im 11/93  soft-tissue]
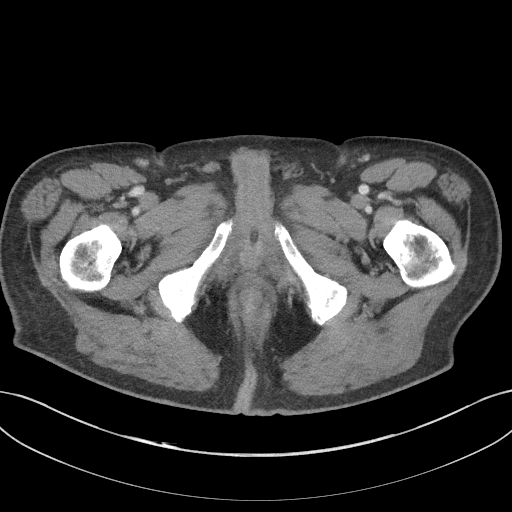
[im 21/93  soft-tissue]
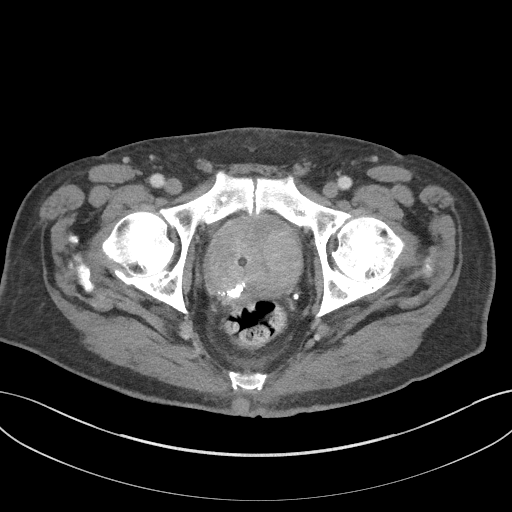
[im 31/93  soft-tissue]
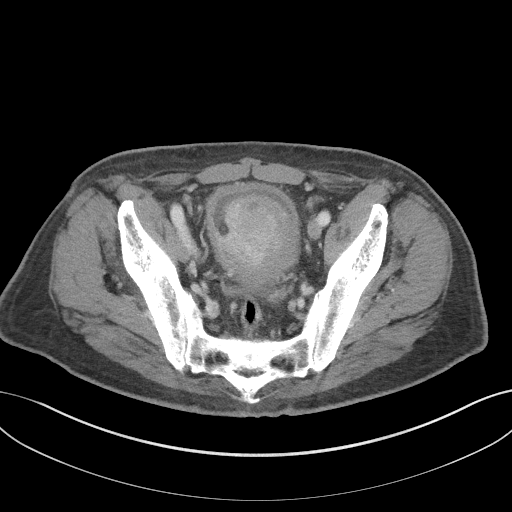
[im 41/93  soft-tissue]
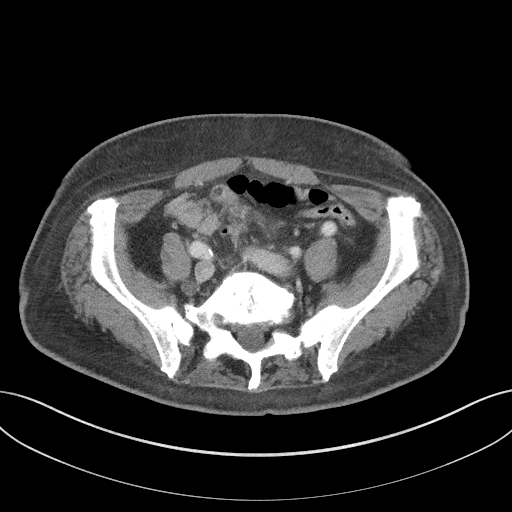

[12 of 32 positions shown; findings below may reference images not displayed]

FINDINGS: Lower chest: No significant pulmonary nodules or acute consolidative
airspace disease.

Hepatobiliary: Normal liver with no liver mass. Multiple calcified
layering gallstones measuring up to 1.0 cm within the gallbladder,
with no gallbladder wall thickening or pericholecystic fluid. No
biliary ductal dilatation.

Pancreas: Normal, with no mass or duct dilation.

Spleen: Normal size. No mass.

Adrenals/Urinary Tract: Normal right adrenal. Left adrenal 1.3 x
cm adenoma with density 12 HU on the precontrast sequence. No renal
stones. No hydronephrosis. Normal caliber ureters, with no ureteral
stones. Small simple parapelvic renal cyst in the interpolar left
renal sinus. Three scattered subcentimeter hypodense renal cortical
lesions in the mid to lower left kidney, too small to characterize.
There is mild urothelial wall thickening involving an approximately
1.5 cm in length segment of the right ureter at the junction of the
lumbar and pelvic segments of the right ureter anterior to the right
common iliac artery (series 13/image 50 and series 15/image 47).
Otherwise no urothelial wall thickening or focal urothelial mass in
the renal collecting systems or ureters, noting non opacification of
much of the lumbar segment of the left ureter, limiting evaluation
in this location. Moderate diffuse bladder wall thickening.
Nonspecific fat stranding surrounding the urinary bladder. No
bladder stones. No focal bladder mass. Foley catheter is well
positioned within the urinary bladder, which is relatively
collapsed.

Stomach/Bowel: Grossly normal stomach. Normal caliber small bowel
with no small bowel wall thickening. Normal appendix. Mild
diverticulosis in the proximal sigmoid colon, with no large bowel
wall thickening or pericolonic fat stranding.

Vascular/Lymphatic: Normal caliber abdominal aorta. Patent portal,
splenic, hepatic and renal veins. No pathologically enlarged lymph
nodes in the abdomen or pelvis.

Reproductive: Massive prostatomegaly, with prostate volume 355 cc
(9.8 x 8.7 x 8.0 cm). Nonspecific coarse calcifications throughout
the posterior prostate. Prominent mass effect on the bladder base by
the markedly enlarged central lobe of the prostate.

Other: No pneumoperitoneum, ascites or focal fluid collection.

Musculoskeletal: No aggressive appearing focal osseous lesions.
There is increased sclerosis, trabecular thickening and overall mild
expansion of the L4 and L5 vertebral bodies, most consistent with
Paget's disease. Moderate degenerative changes in the visualized
thoracolumbar spine.
IMPRESSION: 1. No urolithiasis.  No hydronephrosis .
2. Nonspecific mild urothelial wall thickening involving a 1.5 cm
segment of the right ureter at the junction of the lumbar and pelvic
segments of the right ureter. Findings could be due to ureteral
spasm, inflammation or neoplasm. Consider correlation with
retrograde pyelography and ureteral brushings versus follow-up
hematuria protocol CT abdomen/pelvis without and with IV contrast in
3 months, as clinically warranted.
3. Massive prostatomegaly.
4. Moderate diffuse bladder wall thickening with perivesical fat
stranding. Foley catheter in place within the bladder lumen. Acute
cystitis cannot be excluded. Correlate with urinalysis.
5. Additional findings include cholelithiasis, left adrenal adenoma,
mild sigmoid diverticulosis and probable Paget's disease of the L4
and L5 vertebral bodies.

## 2017-06-22 ENCOUNTER — Other Ambulatory Visit: Payer: Self-pay

## 2017-06-22 ENCOUNTER — Emergency Department
Admission: EM | Admit: 2017-06-22 | Discharge: 2017-06-22 | Disposition: A | Discharge status: Home / Self Care | Payer: Medicare HMO | Attending: Emergency Medicine | Admitting: Emergency Medicine

## 2017-06-22 DIAGNOSIS — I129 Hypertensive chronic kidney disease with stage 1 through stage 4 chronic kidney disease, or unspecified chronic kidney disease: Secondary | ICD-10-CM | POA: Insufficient documentation

## 2017-06-22 DIAGNOSIS — N189 Chronic kidney disease, unspecified: Secondary | ICD-10-CM | POA: Diagnosis not present

## 2017-06-22 DIAGNOSIS — R197 Diarrhea, unspecified: Secondary | ICD-10-CM | POA: Diagnosis not present

## 2017-06-22 DIAGNOSIS — F419 Anxiety disorder, unspecified: Secondary | ICD-10-CM | POA: Insufficient documentation

## 2017-06-22 DIAGNOSIS — Z79899 Other long term (current) drug therapy: Secondary | ICD-10-CM | POA: Diagnosis not present

## 2017-06-22 DIAGNOSIS — Z760 Encounter for issue of repeat prescription: Secondary | ICD-10-CM | POA: Diagnosis not present

## 2017-06-22 DIAGNOSIS — M6281 Muscle weakness (generalized): Secondary | ICD-10-CM | POA: Diagnosis not present

## 2017-06-22 DIAGNOSIS — R69 Illness, unspecified: Secondary | ICD-10-CM | POA: Diagnosis not present

## 2017-06-22 MED ORDER — PERPHENAZINE-AMITRIPTYLINE 4-25 MG PO TABS: 1.0000 | ORAL_TABLET | Freq: Every day | ORAL | 0 refills | Status: DC

## 2017-06-22 NOTE — ED Notes (Signed)
Pt reports he is out of elavil for 2 months   Pt requesting a refill.  Pt brought in via ems.  Pt alert.

## 2017-06-22 NOTE — ED Notes (Signed)
Pt to ED to lobby via EMS c/o diarrhea and ran out of medications, VSS, respirations even and unlabored.

## 2017-06-22 NOTE — ED Triage Notes (Signed)
First Nurse Note:  Arrives by EMS.  Seen through ED on Friday for same complaints.  Patient had medication RX written for him on Friday and went to have RX today and yesterday, unable to fill RX.  Here today to get another Rx.

## 2017-06-22 NOTE — Discharge Instructions (Signed)
Follow-up with your regular doctor.  You will need to have this prescription filled at [facility] on [address] which used to be the right aid.  They are the only ones that have this medication.  [facility].

## 2017-06-22 NOTE — ED Notes (Signed)
Called [place] [facility] and they state the medication would be there today, stating they had to order it.the patient was informed.

## 2017-06-22 NOTE — ED Triage Notes (Signed)
Pt comes into the ED via EMS from home, pt states he was here on Friday for a medication refill but one of the medications [facility] has not been able to fill yet and he states he needs his medication for his nerves. [facility]ing to [facility] [place] to get information.

## 2017-06-22 NOTE — ED Provider Notes (Signed)
[facility] Emergency Department Provider Note  ____________________________________________   First MD Initiated Contact with Patient 06/22/17 1521     (approximate)  I have reviewed the triage vital signs and the nursing notes.   HISTORY  Chief Complaint Medication Refill    HPI Aaron Stokes is a 77 y.o. male presents emergency department because he has been out of his Elavil for 2 months.  He states he came here and got a refill but [facility].  He states that really need my nerve pills.  He denies any other issues.  [facility] [address] but [facility]  Past Medical History:  Diagnosis Date  . Acid reflux   . Anxiety   . Chronic kidney disease    UTI; hematuria  . Hypertension   . Insomnia   . Mentally challenged     Patient Active Problem List   Diagnosis Date Noted  . BPH (benign prostatic hypertrophy) with urinary retention 10/23/2015  . Foley catheter problem ([place])   . Hematuria   . Acute urinary retention 11/26/2014  . Hypertension 11/26/2014  . Anxiety 11/26/2014  . Insomnia 11/26/2014    Past Surgical History:  Procedure Laterality Date  . CIRCUMCISION, NON-NEWBORN    . CYSTOSCOPY WITH STENT PLACEMENT Right 08/21/2015   Procedure: cystoscopy;  Surgeon: Nickie Retort, MD;  Location: [facility] ORS;  Service: Urology;  Laterality: Right;  . CYSTOSCOPY WITH STENT PLACEMENT Right 12/11/2015   Procedure: CYSTOSCOPY WITH STENT PLACEMENT;  Surgeon: Nickie Retort, MD;  Location: [facility] OR[facility];  Service: Urology;  Laterality: Right;  . HOLEP-LASER ENUCLEATION OF THE PROSTATE WITH MORCELLATION N/A 10/23/2015   Procedure: HOLEP-LASER ENUCLEATION OF THE PROSTATE WITH MORCELLATION;  Surgeon: Hollice Espy, MD;  Location: [facility] ORS;  Service: Urology;  Laterality: N/A;  . TRANSURETHRAL RESECTION OF BLADDER TUMOR  12/11/2015   Procedure: TRANSURETHRAL RESECTION OF BLADDER TUMOR  (TURBT);  Surgeon: Nickie Retort, MD;  Location: [facility] ORS;  Service: Urology;;  . URETEROSCOPY WITH HOLMIUM LASER LITHOTRIPSY Right 12/11/2015   Procedure: URETEROSCOPY with biopsy;  Surgeon: Nickie Retort, MD;  Location: [facility] OR[facility];  Service: Urology;  Laterality: Right;    Prior to Admission medications   Medication Sig Start Date End Date Taking? Authorizing Provider  amLODipine (NORVASC) 10 MG tablet Take 1 tablet (10 mg total) by mouth at bedtime. 06/18/17   Triplett, Cari B, FNP  atenolol (TENORMIN) 50 MG tablet Take 1 tablet (50 mg total) by mouth at bedtime. 06/18/17   Triplett, Johnette Abraham B, FNP  finasteride (PROSCAR) 5 MG tablet take 1 tablet by mouth once daily 01/21/17   Zara Council A, PA-C  perphenazine-amitriptyline (ETRAFON/TRIAVIL) 4-25 MG TABS tablet Take 1 tablet by mouth at bedtime. 06/22/17   Versie Starks, PA-C    Allergies Patient has no known allergies.  Family History  Problem Relation Age of Onset  . Bladder Cancer Father   . Bladder Cancer Paternal Grandmother   . Prostate cancer Neg Hx     Social History Social History   Tobacco Use  . Smoking status: Never Smoker  . Smokeless tobacco: Never Used  Substance Use Topics  . Alcohol use: No    Alcohol/week: 0.0 oz  . Drug use: No    Review of Systems  Constitutional: No fever/chills, needs med refill due to nerves Eyes: No visual changes. ENT: No sore throat. Respiratory: Denies  cough Genitourinary: Negative for dysuria. Musculoskeletal: Negative for back pain. Skin: Negative for rash.    ____________________________________________   PHYSICAL EXAM:  VITAL SIGNS: ED Triage Vitals  Enc Vitals Group     BP 06/22/17 1457 136/69     Pulse Rate 06/22/17 1457 (!) 56     Resp 06/22/17 1457 18     Temp 06/22/17 1457 97.8 F (36.6 C)     Temp Source 06/22/17 1457 Oral     SpO2 06/22/17 1457 98 %     Weight 06/22/17 1502 210 lb (95.3 kg)     Height 06/22/17 1502 5\' 10"  (1.778 m)     Head  Circumference --      Peak Flow --      Pain Score --      Pain Loc --      Pain Edu? --      Excl. in [place]? --     Constitutional: Alert and oriented. Well appearing and in no acute distress. Eyes: Conjunctivae are normal.  Head: Atraumatic. Nose: No congestion/rhinnorhea. Mouth/Throat: Mucous membranes are moist.   Cardiovascular: Normal rate, regular rhythm.  Heart sounds are normal Respiratory: Normal respiratory effort.  No retractions, lungs clear to auscultation GU: deferred Musculoskeletal: FROM all extremities, warm and well perfused Neurologic:  Normal speech and language.  Skin:  Skin is warm, dry and intact. No rash noted. Psychiatric: Mood and affect are normal. Speech and behavior are normal.  ____________________________________________   LABS (all labs ordered are listed, but only abnormal results are displayed)  Labs Reviewed - No data to display ____________________________________________   ____________________________________________  RADIOLOGY    ____________________________________________   PROCEDURES  Procedure(s) performed: No  Procedures    ____________________________________________   INITIAL IMPRESSION / ASSESSMENT AND PLAN / ED COURSE  Pertinent labs & imaging results that were available during my care of the patient were reviewed by me and considered in my medical decision making (see chart for details).  Patient 77 year old male who needs a med refill for his Elavil.  He was seen here and given a refill but has not been able to get the [facility]  [facility] and discussed [facility] with the pharmacist.  He said he does have a prescription there but they are not able to obtain this product.  His supplier does not have this.  I told him to cancel the prescription.  He agreed to do this.  Explained to the patient that he will need to [facility] to have this medication filled.  When I talked to their pharmacist they do have this medication in stock.  The patient states he understands and agrees to follow-up with his regular doctor for any other issues.  He was discharged in stable condition     As part of my medical decision making, I reviewed the following data within the [place] notes reviewed and incorporated, Old chart reviewed, Notes from prior ED visits and [place] Controlled Substance Database  ____________________________________________   FINAL CLINICAL IMPRESSION(S) / ED DIAGNOSES  Final diagnoses:  Medication refill      NEW MEDICATIONS STARTED DURING THIS VISIT:  Discharge Medication List as of 06/22/2017  3:33 PM       Note:  This document was prepared using Dragon voice recognition software and may include unintentional dictation errors.    Versie Starks, PA-C  06/22/17 1601    Nance Pear, MD 06/22/17 [phone]

## 2017-07-06 ENCOUNTER — Encounter: Payer: Self-pay | Admitting: Physician Assistant

## 2017-07-06 ENCOUNTER — Ambulatory Visit (INDEPENDENT_AMBULATORY_CARE_PROVIDER_SITE_OTHER): Payer: Medicare HMO | Admitting: Physician Assistant

## 2017-07-06 VITALS — BP 96/60 | HR 52 | Temp 97.8°F | Resp 16 | Ht 69.0 in | Wt 203.0 lb

## 2017-07-06 DIAGNOSIS — G4701 Insomnia due to medical condition: Secondary | ICD-10-CM | POA: Diagnosis not present

## 2017-07-06 DIAGNOSIS — I10 Essential (primary) hypertension: Secondary | ICD-10-CM | POA: Diagnosis not present

## 2017-07-06 DIAGNOSIS — R69 Illness, unspecified: Secondary | ICD-10-CM | POA: Diagnosis not present

## 2017-07-06 DIAGNOSIS — Z7689 Persons encountering health services in other specified circumstances: Secondary | ICD-10-CM | POA: Diagnosis not present

## 2017-07-06 DIAGNOSIS — F209 Schizophrenia, unspecified: Secondary | ICD-10-CM | POA: Insufficient documentation

## 2017-07-06 MED ORDER — AMLODIPINE BESYLATE 10 MG PO TABS: 10.0000 mg | ORAL_TABLET | Freq: Every day | ORAL | 1 refills | Status: DC

## 2017-07-06 MED ORDER — ATENOLOL 50 MG PO TABS: 50.0000 mg | ORAL_TABLET | Freq: Every day | ORAL | 1 refills | Status: DC

## 2017-07-06 MED ORDER — PERPHENAZINE-AMITRIPTYLINE 4-25 MG PO TABS: 1.0000 | ORAL_TABLET | Freq: Every day | ORAL | 1 refills | Status: DC

## 2017-07-06 NOTE — Progress Notes (Signed)
Patient: Aaron Stokes Male    DOB: 01/20/1941   77 y.o.   MRN: [id number] Visit Date: 07/06/2017  Today's Provider: Mar Daring, PA-C   Chief Complaint  Patient presents with  . New Patient (Initial Visit)   Subjective:    HPI Patient here today to establish care, patient transferring from Dr. Alyson Ingles.  Patient reports good tolerance and compliance with medications. Patient denies any chest pain, shortness of breath, swelling around feet or ankles. Patient reports healthy diet, and reports being active with daily activities.   He lives alone. States occasionally a step-daughter will come visit but not often. No biological children. Does not smoke, drink or use drugs. Followed by Urology for BPH with urinary retention.    No Known Allergies   Current Outpatient Medications:  .  amLODipine (NORVASC) 10 MG tablet, Take 1 tablet (10 mg total) by mouth at bedtime., Disp: 30 tablet, Rfl: 0 .  atenolol (TENORMIN) 50 MG tablet, Take 1 tablet (50 mg total) by mouth at bedtime., Disp: 30 tablet, Rfl: 0 .  finasteride (PROSCAR) 5 MG tablet, take 1 tablet by mouth once daily, Disp: 90 tablet, Rfl: 4 .  perphenazine-amitriptyline (ETRAFON/TRIAVIL) 4-25 MG TABS tablet, Take 1 tablet by mouth at bedtime., Disp: 20 tablet, Rfl: 0  Review of Systems  Constitutional: Negative.   HENT: Negative.   Eyes: Negative.   Respiratory: Negative.   Cardiovascular: Negative.   Gastrointestinal: Negative.   Endocrine: Negative.   Genitourinary: Positive for difficulty urinating (followed by urology and on Finasteride).  Musculoskeletal: Negative.   Skin: Negative.   Allergic/Immunologic: Negative.   Neurological: Negative.   Hematological: Negative.   Psychiatric/Behavioral: Negative.     Social History   Tobacco Use  . Smoking status: Never Smoker  . Smokeless tobacco: Never Used  Substance Use Topics  . Alcohol use: No    Alcohol/week: 0.0 oz   Objective:   BP 96/60  (BP Location: Left Arm, Patient Position: Sitting, Cuff Size: Normal)   Pulse (!) 52   Temp 97.8 F (36.6 C) (Oral)   Resp 16   Ht 5\' 9"  (1.753 m)   Wt 203 lb (92.1 kg)   BMI 29.98 kg/m  Vitals:   07/06/17 1042  BP: 96/60  Pulse: (!) 52  Resp: 16  Temp: 97.8 F (36.6 C)  TempSrc: Oral  Weight: 203 lb (92.1 kg)  Height: 5\' 9"  (1.753 m)     Physical Exam  Constitutional: He appears well-developed and well-nourished. No distress.  HENT:  Head: Normocephalic and atraumatic.  Neck: Normal range of motion. Neck supple. No JVD present. No tracheal deviation present. No thyromegaly present.  Cardiovascular: Normal rate, regular rhythm and normal heart sounds. Exam reveals no gallop and no friction rub.  No murmur heard. Pulmonary/Chest: Effort normal and breath sounds normal. No respiratory distress. He has no wheezes. He has no rales.  Musculoskeletal: He exhibits no edema.  Lymphadenopathy:    He has no cervical adenopathy.  Skin: He is not diaphoretic.  Vitals reviewed.       Assessment & Plan:     1. Encounter to establish care Establishing from Dr. Alyson Ingles. I will have him return in 2 months for a AWV/CPE.   2. Essential hypertension Stable. Diagnosis pulled for medication refill. Continue current medical treatment plan. - amLODipine (NORVASC) 10 MG tablet; Take 1 tablet (10 mg total) by mouth at bedtime.  Dispense: 90 tablet; Refill: 1 - atenolol (TENORMIN)  50 MG tablet; Take 1 tablet (50 mg total) by mouth at bedtime.  Dispense: 90 tablet; Refill: 1  3. Insomnia due to medical condition Stable. Diagnosis pulled for medication refill. Continue current medical treatment plan. - perphenazine-amitriptyline (ETRAFON/TRIAVIL) 4-25 MG TABS tablet; Take 1 tablet by mouth at bedtime.  Dispense: 90 tablet; Refill: 1  4. Schizophrenia, unspecified type ([place]) Stable. Diagnosis pulled for medication refill. Continue current medical treatment plan. -  perphenazine-amitriptyline (ETRAFON/TRIAVIL) 4-25 MG TABS tablet; Take 1 tablet by mouth at bedtime.  Dispense: 90 tablet; Refill: Raymond, PA-C  [facility]

## 2017-07-06 NOTE — Patient Instructions (Signed)
DASH Eating Plan DASH stands for "Dietary Approaches to Stop Hypertension." The DASH eating plan is a healthy eating plan that has been shown to reduce high blood pressure (hypertension). It may also reduce your risk for type 2 diabetes, heart disease, and stroke. The DASH eating plan may also help with weight loss. What are tips for following this plan? General guidelines  Avoid eating more than 2,300 mg (milligrams) of salt (sodium) a day. If you have hypertension, you may need to reduce your sodium intake to 1,500 mg a day.  Limit alcohol intake to no more than 1 drink a day for nonpregnant women and 2 drinks a day for men. One drink equals 12 oz of beer, 5 oz of wine, or 1 oz of hard liquor.  Work with your health care provider to maintain a healthy body weight or to lose weight. Ask what an ideal weight is for you.  Get at least 30 minutes of exercise that causes your heart to beat faster (aerobic exercise) most days of the week. Activities may include walking, swimming, or biking.  Work with your health care provider or diet and nutrition specialist (dietitian) to adjust your eating plan to your individual calorie needs. Reading food labels  Check food labels for the amount of sodium per serving. Choose foods with less than 5 percent of the Daily Value of sodium. Generally, foods with less than 300 mg of sodium per serving fit into this eating plan.  To find whole grains, look for the word "whole" as the first word in the ingredient list. Shopping  Buy products labeled as "low-sodium" or "no salt added."  Buy fresh foods. Avoid canned foods and premade or frozen meals. Cooking  Avoid adding salt when cooking. Use salt-free seasonings or herbs instead of table salt or sea salt. Check with your health care provider or pharmacist before using salt substitutes.  Do not fry foods. Cook foods using healthy methods such as baking, boiling, grilling, and broiling instead.  Cook with  heart-healthy oils, such as olive, canola, soybean, or sunflower oil. Meal planning   Eat a balanced diet that includes: ? 5 or more servings of fruits and vegetables each day. At each meal, try to fill half of your plate with fruits and vegetables. ? Up to 6-8 servings of whole grains each day. ? Less than 6 oz of lean meat, poultry, or fish each day. A 3-oz serving of meat is about the same size as a deck of cards. One egg equals 1 oz. ? 2 servings of low-fat dairy each day. ? A serving of nuts, seeds, or beans 5 times each week. ? Heart-healthy fats. Healthy fats called Omega-3 fatty acids are found in foods such as flaxseeds and coldwater fish, like sardines, salmon, and mackerel.  Limit how much you eat of the following: ? Canned or prepackaged foods. ? Food that is high in trans fat, such as fried foods. ? Food that is high in saturated fat, such as fatty meat. ? Sweets, desserts, sugary drinks, and other foods with added sugar. ? Full-fat dairy products.  Do not salt foods before eating.  Try to eat at least 2 vegetarian meals each week.  Eat more home-cooked food and less restaurant, buffet, and fast food.  When eating at a restaurant, ask that your food be prepared with less salt or no salt, if possible. What foods are recommended? The items listed may not be a complete list. Talk with your dietitian about what   dietary choices are best for you. Grains Whole-grain or whole-wheat bread. Whole-grain or whole-wheat pasta. Brown rice. Oatmeal. Quinoa. Bulgur. Whole-grain and low-sodium cereals. Pita bread. Low-fat, low-sodium crackers. Whole-wheat flour tortillas. Vegetables Fresh or frozen vegetables (raw, steamed, roasted, or grilled). Low-sodium or reduced-sodium tomato and vegetable juice. Low-sodium or reduced-sodium tomato sauce and tomato paste. Low-sodium or reduced-sodium canned vegetables. Fruits All fresh, dried, or frozen fruit. Canned fruit in natural juice (without  added sugar). Meat and other protein foods Skinless chicken or turkey. Ground chicken or turkey. Pork with fat trimmed off. Fish and seafood. Egg whites. Dried beans, peas, or lentils. Unsalted nuts, nut butters, and seeds. Unsalted canned beans. Lean cuts of beef with fat trimmed off. Low-sodium, lean deli meat. Dairy Low-fat (1%) or fat-free (skim) milk. Fat-free, low-fat, or reduced-fat cheeses. Nonfat, low-sodium ricotta or cottage cheese. Low-fat or nonfat yogurt. Low-fat, low-sodium cheese. Fats and oils Soft margarine without trans fats. Vegetable oil. Low-fat, reduced-fat, or light mayonnaise and salad dressings (reduced-sodium). Canola, safflower, olive, soybean, and sunflower oils. Avocado. Seasoning and other foods Herbs. Spices. Seasoning mixes without salt. Unsalted popcorn and pretzels. Fat-free sweets. What foods are not recommended? The items listed may not be a complete list. Talk with your dietitian about what dietary choices are best for you. Grains Baked goods made with fat, such as croissants, muffins, or some breads. Dry pasta or rice meal packs. Vegetables Creamed or fried vegetables. Vegetables in a cheese sauce. Regular canned vegetables (not low-sodium or reduced-sodium). Regular canned tomato sauce and paste (not low-sodium or reduced-sodium). Regular tomato and vegetable juice (not low-sodium or reduced-sodium). Pickles. Olives. Fruits Canned fruit in a light or heavy syrup. Fried fruit. Fruit in cream or butter sauce. Meat and other protein foods Fatty cuts of meat. Ribs. Fried meat. Bacon. Sausage. Bologna and other processed lunch meats. Salami. Fatback. Hotdogs. Bratwurst. Salted nuts and seeds. Canned beans with added salt. Canned or smoked fish. Whole eggs or egg yolks. Chicken or turkey with skin. Dairy Whole or 2% milk, cream, and half-and-half. Whole or full-fat cream cheese. Whole-fat or sweetened yogurt. Full-fat cheese. Nondairy creamers. Whipped toppings.  Processed cheese and cheese spreads. Fats and oils Butter. Stick margarine. Lard. Shortening. Ghee. Bacon fat. Tropical oils, such as coconut, palm kernel, or palm oil. Seasoning and other foods Salted popcorn and pretzels. Onion salt, garlic salt, seasoned salt, table salt, and sea salt. Worcestershire sauce. Tartar sauce. Barbecue sauce. Teriyaki sauce. Soy sauce, including reduced-sodium. Steak sauce. Canned and packaged gravies. Fish sauce. Oyster sauce. Cocktail sauce. Horseradish that you find on the shelf. Ketchup. Mustard. Meat flavorings and tenderizers. Bouillon cubes. Hot sauce and Tabasco sauce. Premade or packaged marinades. Premade or packaged taco seasonings. Relishes. Regular salad dressings. Where to find more information:  National Heart, Lung, and Blood Institute: www.nhlbi.nih.gov  American Heart Association: www.heart.org Summary  The DASH eating plan is a healthy eating plan that has been shown to reduce high blood pressure (hypertension). It may also reduce your risk for type 2 diabetes, heart disease, and stroke.  With the DASH eating plan, you should limit salt (sodium) intake to 2,300 mg a day. If you have hypertension, you may need to reduce your sodium intake to 1,500 mg a day.  When on the DASH eating plan, aim to eat more fresh fruits and vegetables, whole grains, lean proteins, low-fat dairy, and heart-healthy fats.  Work with your health care provider or diet and nutrition specialist (dietitian) to adjust your eating plan to your individual   calorie needs. This information is not intended to replace advice given to you by your health care provider. Make sure you discuss any questions you have with your health care provider. Document Released: 03/26/2011 Document Revised: 03/30/2016 Document Reviewed: 03/30/2016 Elsevier Interactive Patient Education  2018 Elsevier Inc.  

## 2017-07-21 ENCOUNTER — Other Ambulatory Visit: Payer: Self-pay | Admitting: Physician Assistant

## 2017-07-21 DIAGNOSIS — I10 Essential (primary) hypertension: Secondary | ICD-10-CM

## 2017-08-10 ENCOUNTER — Telehealth: Payer: Self-pay

## 2017-08-10 DIAGNOSIS — I10 Essential (primary) hypertension: Secondary | ICD-10-CM

## 2017-08-10 MED ORDER — AMLODIPINE BESYLATE 10 MG PO TABS: 10.0000 mg | ORAL_TABLET | Freq: Every day | ORAL | 1 refills | Status: DC

## 2017-08-10 NOTE — Telephone Encounter (Signed)
Patient had called the office to inquire about his prescription for Amlodipine. Patient states that when he picked up his prescription in March that pharmacy only gave him three tablets. [facility] and would like prescription transferred because he says it was sent to [facility] which is closed. I informed patient that Mrs. Marlyn Corporal is not in the office this afternoon, patient is requesting that this prescription be sent in today stating that it can not wait. Please review chart and advise. KW

## 2017-08-10 NOTE — Telephone Encounter (Signed)
Unable to reach patient at this time, will try again at a later time. KW 

## 2017-08-10 NOTE — Telephone Encounter (Signed)
Sent to [facility].

## 2017-09-06 ENCOUNTER — Ambulatory Visit (INDEPENDENT_AMBULATORY_CARE_PROVIDER_SITE_OTHER): Payer: Medicare HMO

## 2017-09-06 ENCOUNTER — Ambulatory Visit (INDEPENDENT_AMBULATORY_CARE_PROVIDER_SITE_OTHER): Payer: Medicare HMO | Admitting: Physician Assistant

## 2017-09-06 ENCOUNTER — Telehealth: Payer: Self-pay

## 2017-09-06 ENCOUNTER — Encounter: Payer: Self-pay | Admitting: Physician Assistant

## 2017-09-06 ENCOUNTER — Ambulatory Visit: Payer: Self-pay

## 2017-09-06 VITALS — BP 138/68 | HR 55 | Temp 97.5°F | Ht 69.0 in | Wt 215.2 lb

## 2017-09-06 VITALS — BP 138/68 | HR 50 | Temp 97.5°F | Ht 69.0 in | Wt 215.2 lb

## 2017-09-06 DIAGNOSIS — Z833 Family history of diabetes mellitus: Secondary | ICD-10-CM | POA: Diagnosis not present

## 2017-09-06 DIAGNOSIS — I10 Essential (primary) hypertension: Secondary | ICD-10-CM | POA: Diagnosis not present

## 2017-09-06 DIAGNOSIS — Z0000 Encounter for general adult medical examination without abnormal findings: Secondary | ICD-10-CM

## 2017-09-06 NOTE — Patient Instructions (Signed)

## 2017-09-06 NOTE — Telephone Encounter (Signed)
Pt was seen for an AWV today and stated that he received his pneumonia vaccines at [facility]. Called pharmacy (now [facility]) and they stated that per their records, pt had only received Shingrix #1 on 07/21/16 and a flu shot on 02/19/17. The tech stated some of the vaccine records were lost (due to pharmacy closing and change over) but there may be additional records on [facility]. Checked [facility] and pt was not in system. Updated immunization log on file. FYI! -MM

## 2017-09-06 NOTE — Progress Notes (Signed)
Subjective:   Aaron Stokes is a 77 y.o. male who presents for an Initial Medicare Annual Wellness Visit.  Review of Systems  N/A  Cardiac Risk Factors include: advanced age (>75men, >82 women);dyslipidemia    Objective:    Today's Vitals   09/06/17 1036  BP: (!) 146/72  Pulse: (!) 55  Temp: (!) 97.5 F (36.4 C)  TempSrc: Oral  Weight: 215 lb 3.2 oz (97.6 kg)  Height: 5\' 9"  (1.753 m)  PainSc: 0-No pain   Body mass index is 31.78 kg/m.  Advanced Directives 09/06/2017 06/22/2017 06/18/2017 10/23/2015 10/09/2015 08/07/2015 05/15/2015  Does Patient Have a Medical Advance Directive? No No No No No No No  Would patient like information on creating a medical advance directive? No - Patient declined No - Patient declined - No - patient declined information No - patient declined information No - patient declined information -    Current Medications (verified) Outpatient Encounter Medications as of 09/06/2017  Medication Sig  . amLODipine (NORVASC) 10 MG tablet Take 1 tablet (10 mg total) by mouth at bedtime.  Marland Kitchen atenolol (TENORMIN) 50 MG tablet TAKE 1 TABLET BY MOUTH EVERYDAY AT BEDTIME  . finasteride (PROSCAR) 5 MG tablet take 1 tablet by mouth once daily  . perphenazine-amitriptyline (ETRAFON/TRIAVIL) 4-25 MG TABS tablet Take 1 tablet by mouth at bedtime.   No facility-administered encounter medications on file as of 09/06/2017.     Allergies (verified) Patient has no known allergies.   History: Past Medical History:  Diagnosis Date  . Acid reflux   . Anxiety   . Chronic kidney disease    UTI; hematuria  . Hypertension   . Insomnia   . Mentally challenged    Past Surgical History:  Procedure Laterality Date  . CIRCUMCISION, NON-NEWBORN    . CYSTOSCOPY WITH STENT PLACEMENT Right 08/21/2015   Procedure: cystoscopy;  Surgeon: Nickie Retort, MD;  Location: [facility];  Service: Urology;  Laterality: Right;  . CYSTOSCOPY WITH STENT PLACEMENT Right 12/11/2015   Procedure:  CYSTOSCOPY WITH STENT PLACEMENT;  Surgeon: Nickie Retort, MD;  Location: [facility];  Service: Urology;  Laterality: Right;  . HOLEP-LASER ENUCLEATION OF THE PROSTATE WITH MORCELLATION N/A 10/23/2015   Procedure: HOLEP-LASER ENUCLEATION OF THE PROSTATE WITH MORCELLATION;  Surgeon: Hollice Espy, MD;  Location: [facility];  Service: Urology;  Laterality: N/A;  . TRANSURETHRAL RESECTION OF BLADDER TUMOR  12/11/2015   Procedure: TRANSURETHRAL RESECTION OF BLADDER TUMOR (TURBT);  Surgeon: Nickie Retort, MD;  Location: [facility];  Service: Urology;;  . URETEROSCOPY WITH HOLMIUM LASER LITHOTRIPSY Right 12/11/2015   Procedure: URETEROSCOPY with biopsy;  Surgeon: Nickie Retort, MD;  Location: [facility];  Service: Urology;  Laterality: Right;   Family History  Problem Relation Age of Onset  . Bladder Cancer Father   . Cancer Father   . Bladder Cancer Paternal Grandmother   . Cancer Brother   . Prostate cancer Neg Hx    Social History   Socioeconomic History  . Marital status: Divorced    Spouse name: Not on file  . Number of children: 0  . Years of education: Not on file  . Highest education level: 12th grade  Occupational History  . Occupation: retired  Scientific laboratory technician  . Financial resource strain: Not hard at all  . Food insecurity:    Worry: Never true    Inability: Never true  . Transportation needs:    Medical: No    Non-medical: No  Tobacco  Use  . Smoking status: Never Smoker  . Smokeless tobacco: Never Used  Substance and Sexual Activity  . Alcohol use: No    Alcohol/week: 0.0 oz  . Drug use: No  . Sexual activity: Not on file  Lifestyle  . Physical activity:    Days per week: Not on file    Minutes per session: Not on file  . Stress: Not at all  Relationships  . Social connections:    Talks on phone: Not on file    Gets together: Not on file    Attends religious service: Not on file    Active member of club or organization: Not on file    Attends meetings of  clubs or organizations: Not on file    Relationship status: Not on file  Other Topics Concern  . Not on file  Social History Narrative  . Not on file   Tobacco Counseling Counseling given: Not Answered   Clinical Intake:  Pre-visit preparation completed: Yes  Pain : No/denies pain Pain Score: 0-No pain     Nutritional Status: BMI > 30  Obese Nutritional Risks: None Diabetes: No  How often do you need to have someone help you when you read instructions, pamphlets, or other written materials from your doctor or pharmacy?: 1 - Never  Interpreter Needed?: No  Information entered by :: [facility], LPN  Activities of Daily Living In your present state of health, do you have any difficulty performing the following activities: 09/06/2017 07/06/2017  Hearing? N N  Vision? N N  Difficulty concentrating or making decisions? N N  Walking or climbing stairs? N N  Dressing or bathing? N N  Doing errands, shopping? N N  Preparing Food and eating ? N -  Using the Toilet? N -  In the past six months, have you accidently leaked urine? N -  Do you have problems with loss of bowel control? N -  Managing your Medications? N -  Managing your Finances? N -  Housekeeping or managing your Housekeeping? N -  Some recent data might be hidden     Immunizations and Health Maintenance Immunization History  Administered Date(s) Administered  . Influenza-Unspecified 02/04/2015   Health Maintenance Due  Topic Date Due  . TETANUS/TDAP  06[birth year]18/1959  . PNA vac Low Risk Adult (1 of 2 - PCV13) 06/19/2006    Patient Care Team: Mar Daring, PA-C as PCP - General (Family Medicine)  Indicate any recent Medical Services you may have received from other than [facility] providers in the past year (date may be approximate).    Assessment:   This is a routine wellness examination for Aaron Stokes.  Hearing/Vision screen No exam data present  Dietary issues and exercise activities discussed: Current  Exercise Habits: The patient does not participate in regular exercise at present, Exercise limited by: None identified  Goals    . DIET - INCREASE WATER INTAKE     Recommend increasing water intake to 6 glasses a day.       Depression Screen PHQ 2/9 Scores 09/06/2017 07/06/2017  PHQ - 2 Score 0 0  PHQ- 9 Score - 0    Fall Risk Fall Risk  09/06/2017 07/06/2017  Falls in the past year? No No    Is the patient's home free of loose throw rugs in walkways, pet beds, electrical cords, etc?   yes      Grab bars in the bathroom? no      Handrails on the stairs?  no      Adequate lighting?   yes  Timed Get Up and Go performed: N/A  Cognitive Function: Pt declined screening today.         Screening Tests Health Maintenance  Topic Date Due  . TETANUS/TDAP  06/18/1960  . PNA vac Low Risk Adult (1 of 2 - PCV13) 06/19/2006  . INFLUENZA VACCINE  11/18/2017    Qualifies for Shingles Vaccine? Due for Shingles vaccine. Declined my offer to administer today. Education has been provided regarding the importance of this vaccine. Pt has been advised to call her insurance company to determine her out of pocket expense. Advised she may also receive this vaccine at her local pharmacy or Health Dept. Verbalized acceptance and understanding.  Cancer Screenings: Lung: Low Dose CT Chest recommended if Age 48-80 years, 30 pack-year currently smoking OR have quit w/in 15years. Patient does not qualify. Colorectal: N/A  Additional Screenings:  Hepatitis C Screening: N/A      Plan:  I have personally reviewed and addressed the Medicare Annual Wellness questionnaire and have noted the following in the patient's chart:  A. Medical and social history B. Use of alcohol, tobacco or illicit drugs  C. Current medications and supplements D. Functional ability and status E.  Nutritional status F.  Physical activity G. Advance directives H. List of other physicians I.  Hospitalizations, surgeries, and  ER visits in previous 12 months J.  [place] such as hearing and vision if needed, cognitive and depression L. Referrals and appointments - none  In addition, I have reviewed and discussed with patient certain preventive protocols, quality metrics, and best practice recommendations. A written personalized care plan for preventive services as well as general preventive health recommendations were provided to patient.  See attached scanned questionnaire for additional information.   Signed,  Fabio Neighbors, LPN Nurse Health Advisor   Nurse Recommendations: Pt states that he has received the Shingrix and pneumonia vaccines at pharmacy ([facility]). Will contact pharmacy and update records. Pt declined the tetanus vaccine today.

## 2017-09-06 NOTE — Patient Instructions (Signed)
Mr. Aaron Stokes , Thank you for taking time to come for your Medicare Wellness Visit. I appreciate your ongoing commitment to your health goals. Please review the following plan we discussed and let me know if I can assist you in the future.   Screening recommendations/referrals: Colonoscopy: N/A Recommended yearly ophthalmology/optometry visit for glaucoma screening and checkup Recommended yearly dental visit for hygiene and checkup  Vaccinations: Influenza vaccine: N/A Pneumococcal vaccine: Awaiting outside records.. Tdap vaccine: Pt declines today.  Shingles vaccine: Pt declines today.     Advanced directives: Advance directive discussed with you today. Even though you declined this today please call our office should you change your mind and we can give you the proper paperwork for you to fill out.  Conditions/risks identified: Recommend increasing water intake to 6 glasses a day.   Next appointment: 11:20 AM today with Fenton Malling.  Preventive Care 76 Years and Older, Male Preventive care refers to lifestyle choices and visits with your health care provider that can promote health and wellness. What does preventive care include?  A yearly physical exam. This is also called an annual well check.  Dental exams once or twice a year.  Routine eye exams. Ask your health care provider how often you should have your eyes checked.  Personal lifestyle choices, including:  Daily care of your teeth and gums.  Regular physical activity.  Eating a healthy diet.  Avoiding tobacco and drug use.  Limiting alcohol use.  Practicing safe sex.  Taking low doses of aspirin every day.  Taking vitamin and mineral supplements as recommended by your health care provider. What happens during an annual well check? The services and screenings done by your health care provider during your annual well check will depend on your age, overall health, lifestyle risk factors, and family history of  disease. Counseling  Your health care provider may ask you questions about your:  Alcohol use.  Tobacco use.  Drug use.  Emotional well-being.  Home and relationship well-being.  Sexual activity.  Eating habits.  History of falls.  Memory and ability to understand (cognition).  Work and work Statistician. Screening  You may have the following tests or measurements:  Height, weight, and BMI.  Blood pressure.  Lipid and cholesterol levels. These may be checked every 5 years, or more frequently if you are over 76 years old.  Skin check.  Lung cancer screening. You may have this screening every year starting at age 76 if you have a 30-pack-year history of smoking and currently smoke or have quit within the past 15 years.  Fecal occult blood test (FOBT) of the stool. You may have this test every year starting at age 76.  Flexible sigmoidoscopy or colonoscopy. You may have a sigmoidoscopy every 5 years or a colonoscopy every 10 years starting at age 76.  Prostate cancer screening. Recommendations will vary depending on your family history and other risks.  Hepatitis C blood test.  Hepatitis B blood test.  Sexually transmitted disease (STD) testing.  Diabetes screening. This is done by checking your blood sugar (glucose) after you have not eaten for a while (fasting). You may have this done every 1-3 years.  Abdominal aortic aneurysm (AAA) screening. You may need this if you are a current or former smoker.  Osteoporosis. You may be screened starting at age 76 if you are at high risk. Talk with your health care provider about your test results, treatment options, and if necessary, the need for more tests. Vaccines  Your health care provider may recommend certain vaccines, such as:  Influenza vaccine. This is recommended every year.  Tetanus, diphtheria, and acellular pertussis (Tdap, Td) vaccine. You may need a Td booster every 10 years.  Zoster vaccine. You may  need this after age 76.  Pneumococcal 13-valent conjugate (PCV13) vaccine. One dose is recommended after age 76.  Pneumococcal polysaccharide (PPSV23) vaccine. One dose is recommended after age 76. Talk to your health care provider about which screenings and vaccines you need and how often you need them. This information is not intended to replace advice given to you by your health care provider. Make sure you discuss any questions you have with your health care provider. Document Released: 05/03/2015 Document Revised: 12/25/2015 Document Reviewed: 02/05/2015 Elsevier Interactive Patient Education  2017 [place] Prevention in the Home Falls can cause injuries. They can happen to people of all ages. There are many things you can do to make your home safe and to help prevent falls. What can I do on the outside of my home?  Regularly fix the edges of walkways and driveways and fix any cracks.  Remove anything that might make you trip as you walk through a door, such as a raised step or threshold.  Trim any bushes or trees on the path to your home.  Use bright outdoor lighting.  Clear any walking paths of anything that might make someone trip, such as rocks or tools.  Regularly check to see if handrails are loose or broken. Make sure that both sides of any steps have handrails.  Any raised decks and porches should have guardrails on the edges.  Have any leaves, snow, or ice cleared regularly.  Use sand or salt on walking paths during winter.  Clean up any spills in your garage right away. This includes oil or grease spills. What can I do in the bathroom?  Use night lights.  Install grab bars by the toilet and in the tub and shower. Do not use towel bars as grab bars.  Use non-skid mats or decals in the tub or shower.  If you need to sit down in the shower, use a plastic, non-slip stool.  Keep the floor dry. Clean up any water that spills on the floor as soon as it  happens.  Remove soap buildup in the tub or shower regularly.  Attach bath mats securely with double-sided non-slip rug tape.  Do not have throw rugs and other things on the floor that can make you trip. What can I do in the bedroom?  Use night lights.  Make sure that you have a light by your bed that is easy to reach.  Do not use any sheets or blankets that are too big for your bed. They should not hang down onto the floor.  Have a firm chair that has side arms. You can use this for support while you get dressed.  Do not have throw rugs and other things on the floor that can make you trip. What can I do in the kitchen?  Clean up any spills right away.  Avoid walking on wet floors.  Keep items that you use a lot in easy-to-reach places.  If you need to reach something above you, use a strong step stool that has a grab bar.  Keep electrical cords out of the way.  Do not use floor polish or wax that makes floors slippery. If you must use wax, use non-skid floor wax.  Do  not have throw rugs and other things on the floor that can make you trip. What can I do with my stairs?  Do not leave any items on the stairs.  Make sure that there are handrails on both sides of the stairs and use them. Fix handrails that are broken or loose. Make sure that handrails are as long as the stairways.  Check any carpeting to make sure that it is firmly attached to the stairs. Fix any carpet that is loose or worn.  Avoid having throw rugs at the top or bottom of the stairs. If you do have throw rugs, attach them to the floor with carpet tape.  Make sure that you have a light switch at the top of the stairs and the bottom of the stairs. If you do not have them, ask someone to add them for you. What else can I do to help prevent falls?  Wear shoes that:  Do not have high heels.  Have rubber bottoms.  Are comfortable and fit you well.  Are closed at the toe. Do not wear sandals.  If you  use a stepladder:  Make sure that it is fully opened. Do not climb a closed stepladder.  Make sure that both sides of the stepladder are locked into place.  Ask someone to hold it for you, if possible.  Clearly mark and make sure that you can see:  Any grab bars or handrails.  First and last steps.  Where the edge of each step is.  Use tools that help you move around (mobility aids) if they are needed. These include:  Canes.  Walkers.  Scooters.  Crutches.  Turn on the lights when you go into a dark area. Replace any light bulbs as soon as they burn out.  Set up your furniture so you have a clear path. Avoid moving your furniture around.  If any of your floors are uneven, fix them.  If there are any pets around you, be aware of where they are.  Review your medicines with your doctor. Some medicines can make you feel dizzy. This can increase your chance of falling. Ask your doctor what other things that you can do to help prevent falls. This information is not intended to replace advice given to you by your health care provider. Make sure you discuss any questions you have with your health care provider. Document Released: 01/31/2009 Document Revised: 09/12/2015 Document Reviewed: 05/11/2014 Elsevier Interactive Patient Education  2017 [employer].

## 2017-09-06 NOTE — Progress Notes (Signed)
Patient: Aaron Stokes, Male    DOB: Oct 07, 1940, 77 y.o.   MRN: [id number] Visit Date: 09/06/2017  Today's Provider: Mar Daring, PA-C   Chief Complaint  Patient presents with  . Annual Exam   Subjective:     Complete Physical Aaron Stokes Insley is a 77 y.o. male. He feels well. He reports exercising not regularly. He reports he is sleeping well.  Patient had a AWE prior to visit today. Note reviewed.   Review of Systems  Constitutional: Negative.   HENT: Negative.   Eyes: Negative.   Respiratory: Negative.   Cardiovascular: Negative.   Gastrointestinal: Negative.   Endocrine: Negative.   Genitourinary: Negative.   Musculoskeletal: Negative.   Skin: Negative.   Allergic/Immunologic: Negative.   Neurological: Negative.   Hematological: Negative.   Psychiatric/Behavioral: Negative.     Social History   Socioeconomic History  . Marital status: Divorced    Spouse name: Not on file  . Number of children: 0  . Years of education: Not on file  . Highest education level: 12th grade  Occupational History  . Occupation: retired  Scientific laboratory technician  . Financial resource strain: Not hard at all  . Food insecurity:    Worry: Never true    Inability: Never true  . Transportation needs:    Medical: No    Non-medical: No  Tobacco Use  . Smoking status: Never Smoker  . Smokeless tobacco: Never Used  Substance and Sexual Activity  . Alcohol use: No    Alcohol/week: 0.0 oz  . Drug use: No  . Sexual activity: Not on file  Lifestyle  . Physical activity:    Days per week: Not on file    Minutes per session: Not on file  . Stress: Not at all  Relationships  . Social connections:    Talks on phone: Not on file    Gets together: Not on file    Attends religious service: Not on file    Active member of club or organization: Not on file    Attends meetings of clubs or organizations: Not on file    Relationship status: Not on file  . Intimate partner violence:     Fear of current or ex partner: Not on file    Emotionally abused: Not on file    Physically abused: Not on file    Forced sexual activity: Not on file  Other Topics Concern  . Not on file  Social History Narrative  . Not on file    Past Medical History:  Diagnosis Date  . Acid reflux   . Anxiety   . Chronic kidney disease    UTI; hematuria  . Hypertension   . Insomnia   . Mentally challenged      Patient Active Problem List   Diagnosis Date Noted  . Schizophrenia ([place]) 07/06/2017  . BPH (benign prostatic hypertrophy) with urinary retention 10/23/2015  . Foley catheter problem ([place])   . Hematuria   . Acute urinary retention 11/26/2014  . Hypertension 11/26/2014  . Anxiety 11/26/2014  . Insomnia 11/26/2014    Past Surgical History:  Procedure Laterality Date  . CIRCUMCISION, NON-NEWBORN    . CYSTOSCOPY WITH STENT PLACEMENT Right 08/21/2015   Procedure: cystoscopy;  Surgeon: Nickie Retort, MD;  Location: [facility] ORS;  Service: Urology;  Laterality: Right;  . CYSTOSCOPY WITH STENT PLACEMENT Right 12/11/2015   Procedure: CYSTOSCOPY WITH STENT PLACEMENT;  Surgeon: Nickie Retort, MD;  Location: [facility]  ORS;  Service: Urology;  Laterality: Right;  . HOLEP-LASER ENUCLEATION OF THE PROSTATE WITH MORCELLATION N/A 10/23/2015   Procedure: HOLEP-LASER ENUCLEATION OF THE PROSTATE WITH MORCELLATION;  Surgeon: Hollice Espy, MD;  Location: [facility] ORS;  Service: Urology;  Laterality: N/A;  . TRANSURETHRAL RESECTION OF BLADDER TUMOR  12/11/2015   Procedure: TRANSURETHRAL RESECTION OF BLADDER TUMOR (TURBT);  Surgeon: Nickie Retort, MD;  Location: [facility] ORS;  Service: Urology;;  . URETEROSCOPY WITH HOLMIUM LASER LITHOTRIPSY Right 12/11/2015   Procedure: URETEROSCOPY with biopsy;  Surgeon: Nickie Retort, MD;  Location: [facility] ORS;  Service: Urology;  Laterality: Right;    His family history includes Bladder Cancer in his father and paternal grandmother; Cancer in his brother and  father. There is no history of Prostate cancer.      Current Outpatient Medications:  .  amLODipine (NORVASC) 10 MG tablet, Take 1 tablet (10 mg total) by mouth at bedtime., Disp: 90 tablet, Rfl: 1 .  atenolol (TENORMIN) 50 MG tablet, TAKE 1 TABLET BY MOUTH EVERYDAY AT BEDTIME, Disp: 90 tablet, Rfl: 1 .  finasteride (PROSCAR) 5 MG tablet, take 1 tablet by mouth once daily, Disp: 90 tablet, Rfl: 4 .  perphenazine-amitriptyline (ETRAFON/TRIAVIL) 4-25 MG TABS tablet, Take 1 tablet by mouth at bedtime., Disp: 90 tablet, Rfl: 1  Patient Care Team: Mar Daring, PA-C as PCP - General (Family Medicine)     Objective:   Vitals: BP 138/68 (BP Location: Right Arm, Patient Position: Sitting, Cuff Size: Normal)   Pulse (!) 55   Temp (!) 97.5 F (36.4 C) (Oral)   Ht 5\' 9"  (1.753 m)   Wt 215 lb 3.2 oz (97.6 kg)   BMI 31.78 kg/m   Physical Exam  Constitutional: He is oriented to person, place, and time. He appears well-developed and well-nourished.  HENT:  Head: Normocephalic and atraumatic.  Right Ear: Hearing, tympanic membrane, external ear and ear canal normal.  Left Ear: Hearing, tympanic membrane, external ear and ear canal normal.  Nose: Nose normal.  Mouth/Throat: Uvula is midline, oropharynx is clear and moist and mucous membranes are normal.  Eyes: Pupils are equal, round, and reactive to light. Conjunctivae and EOM are normal. Right eye exhibits no discharge.  Neck: Normal range of motion. Neck supple. No JVD present. Carotid bruit is not present. No tracheal deviation present. No thyromegaly present.  Cardiovascular: Normal rate, regular rhythm, normal heart sounds and intact distal pulses.  No murmur heard. Pulmonary/Chest: Effort normal and breath sounds normal. No respiratory distress. He has no wheezes. He has no rales. He exhibits no tenderness.  Abdominal: Soft. He exhibits no distension and no mass. There is no tenderness. There is no rebound and no guarding.    Genitourinary:  Genitourinary Comments: Deferred to Urology  Musculoskeletal: Normal range of motion. He exhibits no edema or tenderness.  Lymphadenopathy:    He has no cervical adenopathy.  Neurological: He is alert and oriented to person, place, and time. He has normal reflexes. He displays normal reflexes. No cranial nerve deficit. He exhibits normal muscle tone. Coordination normal.  Skin: Skin is warm and dry. No rash noted. No erythema.  Psychiatric: He has a normal mood and affect. His behavior is normal. Judgment and thought content normal.  Vitals reviewed.   Activities of Daily Living In your present state of health, do you have any difficulty performing the following activities: 09/06/2017 07/06/2017  Hearing? N N  Vision? N N  Difficulty concentrating or making decisions? N N  Walking or climbing stairs? N N  Dressing or bathing? N N  Doing errands, shopping? N N  Preparing Food and eating ? N -  Using the Toilet? N -  In the past six months, have you accidently leaked urine? N -  Do you have problems with loss of bowel control? N -  Managing your Medications? N -  Managing your Finances? N -  Housekeeping or managing your Housekeeping? N -  Some recent data might be hidden    Fall Risk Assessment Fall Risk  09/06/2017 07/06/2017  Falls in the past year? No No     Depression Screen PHQ 2/9 Scores 09/06/2017 07/06/2017  PHQ - 2 Score 0 0  PHQ- 9 Score - 0   Cognitive Function: Pt declined screening today.   Assessment & Plan:    Annual Physical Reviewed patient's Family Medical History Reviewed and updated list of patient's medical providers Assessment of cognitive impairment was done Assessed patient's functional ability Established a written schedule for health screening [place] Completed and Reviewed  Exercise Activities and Dietary recommendations Goals    . DIET - INCREASE WATER INTAKE     Recommend increasing water intake to 6  glasses a day.        Immunization History  Administered Date(s) Administered  . Influenza-Unspecified 02/04/2015    Health Maintenance  Topic Date Due  . TETANUS/TDAP  06/18/1960  . PNA vac Low Risk Adult (1 of 2 - PCV13) 06/19/2006  . INFLUENZA VACCINE  11/18/2017     Discussed health benefits of physical activity, and encouraged him to engage in regular exercise appropriate for his age and condition.    1. Annual physical exam Normal exam. AWV normal. Patient declines vaccinations.   2. Essential hypertension Stable. Continue amlodipine 10mg  and atenolol 50mg . Will check labs as below and f/u pending results. - CBC w/Diff/Platelet - Comprehensive Metabolic Panel (CMET) - Lipid Profile - HgB A1c  3. Family history of diabetes mellitus Mother passed from complications of diabetes. Will check labs as below and f/u pending results. - Lipid Profile - HgB A1c     Mar Daring, PA-C  [facility]

## 2017-09-07 ENCOUNTER — Telehealth: Payer: Self-pay

## 2017-09-07 LAB — CBC WITH DIFFERENTIAL/PLATELET
Basophils Absolute: 0 10*3/uL (ref 0.0–0.2)
Basos: 0 %
EOS (ABSOLUTE): 0 10*3/uL (ref 0.0–0.4)
Eos: 0 %
Hematocrit: 40.8 % (ref 37.5–51.0)
Hemoglobin: 13.5 g/dL (ref 13.0–17.7)
Immature Grans (Abs): 0 10*3/uL (ref 0.0–0.1)
Immature Granulocytes: 0 %
Lymphocytes Absolute: 1.5 10*3/uL (ref 0.7–3.1)
Lymphs: 34 %
MCH: 30.1 pg (ref 26.6–33.0)
MCHC: 33.1 g/dL (ref 31.5–35.7)
MCV: 91 fL (ref 79–97)
Monocytes Absolute: 0.3 10*3/uL (ref 0.1–0.9)
Monocytes: 7 %
Neutrophils Absolute: 2.7 10*3/uL (ref 1.4–7.0)
Neutrophils: 59 %
Platelets: 189 10*3/uL (ref 150–450)
RBC: 4.48 x10E6/uL (ref 4.14–5.80)
RDW: 13.5 % (ref 12.3–15.4)
WBC: 4.6 10*3/uL (ref 3.4–10.8)

## 2017-09-07 LAB — COMPREHENSIVE METABOLIC PANEL
ALT: 6 IU/L (ref 0–44)
AST: 14 IU/L (ref 0–40)
Albumin/Globulin Ratio: 1.3 (ref 1.2–2.2)
Albumin: 4.4 g/dL (ref 3.5–4.8)
Alkaline Phosphatase: 96 IU/L (ref 39–117)
BUN/Creatinine Ratio: 8 — ABNORMAL LOW (ref 10–24)
BUN: 10 mg/dL (ref 8–27)
Bilirubin Total: 1.1 mg/dL (ref 0.0–1.2)
CO2: 23 mmol/L (ref 20–29)
Calcium: 10 mg/dL (ref 8.6–10.2)
Chloride: 100 mmol/L (ref 96–106)
Creatinine, Ser: 1.2 mg/dL (ref 0.76–1.27)
GFR calc Af Amer: 67 mL/min/{1.73_m2} (ref >59)
GFR calc non Af Amer: 58 mL/min/{1.73_m2} — ABNORMAL LOW (ref >59)
Globulin, Total: 3.5 g/dL (ref 1.5–4.5)
Glucose: 85 mg/dL (ref 65–99)
Potassium: 4.1 mmol/L (ref 3.5–5.2)
Sodium: 137 mmol/L (ref 134–144)
Total Protein: 7.9 g/dL (ref 6.0–8.5)

## 2017-09-07 LAB — HEMOGLOBIN A1C
Est. average glucose Bld gHb Est-mCnc: 91 mg/dL
Hgb A1c MFr Bld: 4.8 % (ref 4.8–5.6)

## 2017-09-07 LAB — LIPID PANEL
Chol/HDL Ratio: 2.7 ratio (ref 0.0–5.0)
Cholesterol, Total: 167 mg/dL (ref 100–199)
HDL: 63 mg/dL
LDL Calculated: 93 mg/dL (ref 0–99)
Triglycerides: 54 mg/dL (ref 0–149)
VLDL Cholesterol Cal: 11 mg/dL (ref 5–40)

## 2017-09-07 NOTE — Telephone Encounter (Signed)
Patient advised as below.  

## 2017-09-07 NOTE — Telephone Encounter (Signed)
LMTCB

## 2017-09-07 NOTE — Telephone Encounter (Signed)
-----   Message from Mar Daring, PA-C sent at 09/07/2017  8:53 AM EDT ----- All labs are within normal limits and stable.  Thanks! -JB

## 2017-10-08 ENCOUNTER — Telehealth: Payer: Self-pay

## 2017-10-08 DIAGNOSIS — F5101 Primary insomnia: Secondary | ICD-10-CM

## 2017-10-08 MED ORDER — FLURAZEPAM HCL 30 MG PO CAPS: 30.0000 mg | ORAL_CAPSULE | Freq: Every evening | ORAL | 1 refills | Status: DC | PRN

## 2017-10-08 NOTE — Telephone Encounter (Signed)
[facility] fax a request for controlled substance continuance of therapy prescription for the following medication: Flurazepam 30mg  capsule Last Refill:10/16/14 Instruction: Take 1 capsule by mouth at bedtime if needed.  Thanks,  -Pheonix [place]

## 2017-10-08 NOTE — Telephone Encounter (Signed)
Sent in

## 2017-10-13 ENCOUNTER — Other Ambulatory Visit: Payer: Self-pay | Admitting: Physician Assistant

## 2017-10-13 DIAGNOSIS — F5101 Primary insomnia: Secondary | ICD-10-CM

## 2017-10-13 MED ORDER — FLURAZEPAM HCL 15 MG PO CAPS: 30.0000 mg | ORAL_CAPSULE | Freq: Every evening | ORAL | 1 refills | Status: DC | PRN

## 2017-10-13 NOTE — Progress Notes (Signed)
Florazepam 30mg  on back order so Flurazepam 15mg  to take 2 capsules at bedtime was sent in

## 2017-12-22 ENCOUNTER — Telehealth: Payer: Self-pay | Admitting: Physician Assistant

## 2017-12-22 NOTE — Telephone Encounter (Signed)
[name] the pt's brother in law called saying [facility] does not have the Flurazepam 15 mg in stock and may never have it in stock again.  They ask them to call his primary and find out if it could be changed to another medication.  The pharmacy told him they have sent a message to [place] also but told them to call too.  He is completley out of the medication  Pt's  CB# is [phone]  Con [place]

## 2017-12-23 MED ORDER — TEMAZEPAM 15 MG PO CAPS: 15.0000 mg | ORAL_CAPSULE | Freq: Every evening | ORAL | 0 refills | Status: DC | PRN

## 2017-12-23 NOTE — Telephone Encounter (Signed)
[facility] sent a fax stating that flurazepam (DALMANE) 15 MG capsule is not available and they are requesting a new rx.

## 2017-12-23 NOTE — Telephone Encounter (Signed)
Per pharmacist all strengths of Flurazepam is on long term back order. Pharmacy is requesting a different medication so patient will not be with out some type of medication

## 2017-12-23 NOTE — Telephone Encounter (Signed)
It looks like Sonia Baller had initially sent in 30 mg capsules and was told those were not available so she sent in the 50 mg capsules.  Could we see if the pharmacy has any flurazepam in stock at all?  Virginia Crews, MD, MPH [facility] 12/23/2017 9:24 AM

## 2017-12-23 NOTE — Telephone Encounter (Signed)
Sent in Rx for temazepam instead of flurazepam.  Please let patient know.  Virginia Crews, MD, MPH [facility] 12/23/2017 11:00 AM

## 2017-12-23 NOTE — Telephone Encounter (Signed)
Patient advised.

## 2018-01-06 ENCOUNTER — Ambulatory Visit: Payer: Medicare Other

## 2018-01-13 ENCOUNTER — Other Ambulatory Visit: Payer: Self-pay | Admitting: Physician Assistant

## 2018-01-13 DIAGNOSIS — I10 Essential (primary) hypertension: Secondary | ICD-10-CM

## 2018-01-17 ENCOUNTER — Encounter: Payer: Self-pay | Admitting: Urology

## 2018-01-17 ENCOUNTER — Ambulatory Visit (INDEPENDENT_AMBULATORY_CARE_PROVIDER_SITE_OTHER): Payer: Medicare HMO | Admitting: Urology

## 2018-01-17 ENCOUNTER — Other Ambulatory Visit: Payer: Self-pay | Admitting: Family Medicine

## 2018-01-17 VITALS — BP 112/66 | HR 60 | Ht 69.0 in | Wt 205.4 lb

## 2018-01-17 DIAGNOSIS — R339 Retention of urine, unspecified: Secondary | ICD-10-CM | POA: Diagnosis not present

## 2018-01-17 NOTE — Progress Notes (Signed)
01/17/2018 2:35 PM   Tania Ade Cui Aug 04, 1940 [id number]  Referring provider: Ricke Hey, MD [place], [place] [address]  Chief Complaint  Patient presents with  . Benign Prostatic Hypertrophy    HPI: Dr Jadene Pierini:  The patient is 77 year old gentleman with a history of BPH with urinary retention status post HOLEP who presents for annual follow-up. He is doing great this time. He has nocturia 1-2 times per night. He feels like he empties his bladder.  He also has a history of a right ureteral filling defect with negative workup on direct visualization. 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  Today Frequency normal.  Nocturia x1.  Flow good.  On finasteride and gets prescription by primary care physician   PMH: Past Medical History:  Diagnosis Date  . Acid reflux   . Anxiety   . Chronic kidney disease    UTI; hematuria  . Hypertension   . Insomnia   . Mentally challenged     Surgical History: Past Surgical History:  Procedure Laterality Date  . CIRCUMCISION, NON-NEWBORN    . CYSTOSCOPY WITH STENT PLACEMENT Right 08/21/2015   Procedure: cystoscopy;  Surgeon: Nickie Retort, MD;  Location: [facility] ORS;  Service: Urology;  Laterality: Right;  . CYSTOSCOPY WITH STENT PLACEMENT Right 12/11/2015   Procedure: CYSTOSCOPY WITH STENT PLACEMENT;  Surgeon: Nickie Retort, MD;  Location: [facility] ORS;  Service: Urology;  Laterality: Right;  . HOLEP-LASER ENUCLEATION OF THE PROSTATE WITH MORCELLATION N/A 10/23/2015   Procedure: HOLEP-LASER ENUCLEATION OF THE PROSTATE WITH MORCELLATION;  Surgeon: Hollice Espy, MD;  Location: [facility] ORS;  Service: Urology;  Laterality: N/A;  . TRANSURETHRAL RESECTION OF BLADDER TUMOR  12/11/2015   Procedure: TRANSURETHRAL RESECTION OF BLADDER TUMOR (TURBT);  Surgeon: Nickie Retort, MD;  Location: [facility] ORS;  Service: Urology;;  .  URETEROSCOPY WITH HOLMIUM LASER LITHOTRIPSY Right 12/11/2015   Procedure: URETEROSCOPY with biopsy;  Surgeon: Nickie Retort, MD;  Location: [facility] ORS;  Service: Urology;  Laterality: Right;    Home Medications:  Allergies as of 01/17/2018   No Known Allergies     Medication List        Accurate as of 01/17/18  2:35 PM. Always use your most recent med list.          amLODipine 10 MG tablet Commonly known as:  NORVASC Take 1 tablet (10 mg total) by mouth at bedtime.   atenolol 50 MG tablet Commonly known as:  TENORMIN TAKE 1 TABLET BY MOUTH EVERYDAY AT BEDTIME   finasteride 5 MG tablet Commonly known as:  PROSCAR take 1 tablet by mouth once daily   perphenazine-amitriptyline 4-25 MG Tabs tablet Commonly known as:  ETRAFON/TRIAVIL Take 1 tablet by mouth at bedtime.   temazepam 15 MG capsule Commonly known as:  RESTORIL Take 1 capsule (15 mg total) by mouth at bedtime as needed for sleep.       Allergies: No Known Allergies  Family History: Family History  Problem Relation Age of Onset  . Bladder Cancer Father   . Cancer Father   . Bladder Cancer Paternal Grandmother   . Cancer Brother   . Prostate cancer Neg Hx     Social History:  reports that he has never smoked. He has never used smokeless tobacco. He reports that he does not drink alcohol or use drugs.  ROS:  Physical Exam: BP 112/66 (BP Location: Left Arm, Patient Position: Sitting, Cuff Size: Large)   Pulse 60   Ht 5\' 9"  (1.753 m)   Wt 205 lb 6.4 oz (93.2 kg)   BMI 30.33 kg/m   Constitutional:  Alert and oriented, No acute distress.  Laboratory Data: Lab Results  Component Value Date   WBC 4.6 09/06/2017   HGB 13.5 09/06/2017   HCT 40.8 09/06/2017   MCV 91 09/06/2017   PLT 189 09/06/2017    Lab Results  Component Value Date   CREATININE 1.20 09/06/2017    Lab Results  Component Value Date   PSA 19.09 (H) 10/09/2015     No results found for: TESTOSTERONE  Lab Results  Component Value Date   HGBA1C 4.8 09/06/2017    Urinalysis    Component Value Date/Time   COLORURINE YELLOW (A) 10/09/2015 1308   APPEARANCEUR Cloudy (A) 12/19/2015 1329   LABSPEC 1.018 10/09/2015 1308   PHURINE 5.0 10/09/2015 1308   GLUCOSEU CANCELED 12/19/2015 1329   HGBUR 3+ (A) 10/09/2015 1308   BILIRUBINUR NEGATIVE 10/09/2015 1308   BILIRUBINUR Negative 08/19/2015 1420   KETONESUR NEGATIVE 10/09/2015 1308   PROTEINUR CANCELED 12/19/2015 1329   PROTEINUR >500 (A) 10/09/2015 1308   NITRITE POSITIVE (A) 10/09/2015 1308   LEUKOCYTESUR 3+ (A) 10/09/2015 1308   LEUKOCYTESUR 2+ (A) 08/19/2015 1420    Pertinent Imaging:   Assessment & Plan: I will see the patient as needed.  There are no diagnoses linked to this encounter.  No follow-ups on file.  Reece Packer, MD  [facility] [address] [place], [place] [address] [phone]

## 2018-01-25 ENCOUNTER — Other Ambulatory Visit: Payer: Self-pay | Admitting: Physician Assistant

## 2018-01-25 DIAGNOSIS — I10 Essential (primary) hypertension: Secondary | ICD-10-CM

## 2018-02-18 ENCOUNTER — Other Ambulatory Visit: Payer: Self-pay | Admitting: Family Medicine

## 2018-02-18 DIAGNOSIS — R69 Illness, unspecified: Secondary | ICD-10-CM | POA: Diagnosis not present

## 2018-02-21 ENCOUNTER — Other Ambulatory Visit: Payer: Self-pay | Admitting: Family Medicine

## 2018-02-21 DIAGNOSIS — G479 Sleep disorder, unspecified: Secondary | ICD-10-CM

## 2018-02-21 NOTE — Telephone Encounter (Signed)
See refill request.

## 2018-02-21 NOTE — Telephone Encounter (Signed)
Will forward to PCP  Maley Venezia, Dionne Bucy, MD, MPH [facility] 02/21/2018 2:23 PM

## 2018-04-25 ENCOUNTER — Other Ambulatory Visit: Payer: Self-pay | Admitting: Physician Assistant

## 2018-04-25 DIAGNOSIS — R339 Retention of urine, unspecified: Secondary | ICD-10-CM

## 2018-04-25 MED ORDER — FINASTERIDE 5 MG PO TABS: 5.0000 mg | ORAL_TABLET | Freq: Every day | ORAL | 3 refills | Status: DC

## 2018-04-25 NOTE — Progress Notes (Signed)
Refilled proscar

## 2018-07-20 ENCOUNTER — Other Ambulatory Visit: Payer: Self-pay | Admitting: Physician Assistant

## 2018-07-20 DIAGNOSIS — I10 Essential (primary) hypertension: Secondary | ICD-10-CM

## 2018-07-31 ENCOUNTER — Other Ambulatory Visit: Payer: Self-pay | Admitting: Physician Assistant

## 2018-07-31 DIAGNOSIS — I10 Essential (primary) hypertension: Secondary | ICD-10-CM

## 2018-08-19 ENCOUNTER — Other Ambulatory Visit: Payer: Self-pay | Admitting: Physician Assistant

## 2018-08-19 DIAGNOSIS — G479 Sleep disorder, unspecified: Secondary | ICD-10-CM

## 2018-11-07 ENCOUNTER — Ambulatory Visit: Payer: Medicare HMO | Admitting: Physician Assistant

## 2018-11-10 ENCOUNTER — Encounter: Payer: Self-pay | Admitting: Physician Assistant

## 2018-11-10 ENCOUNTER — Other Ambulatory Visit: Payer: Self-pay

## 2018-11-10 ENCOUNTER — Ambulatory Visit (INDEPENDENT_AMBULATORY_CARE_PROVIDER_SITE_OTHER): Payer: Medicare HMO | Admitting: Physician Assistant

## 2018-11-10 VITALS — BP 126/70 | HR 50 | Temp 98.3°F | Wt 189.0 lb

## 2018-11-10 DIAGNOSIS — I10 Essential (primary) hypertension: Secondary | ICD-10-CM

## 2018-11-10 DIAGNOSIS — Z86018 Personal history of other benign neoplasm: Secondary | ICD-10-CM | POA: Insufficient documentation

## 2018-11-10 DIAGNOSIS — R339 Retention of urine, unspecified: Secondary | ICD-10-CM

## 2018-11-10 DIAGNOSIS — N401 Enlarged prostate with lower urinary tract symptoms: Secondary | ICD-10-CM | POA: Diagnosis not present

## 2018-11-10 DIAGNOSIS — Z0000 Encounter for general adult medical examination without abnormal findings: Secondary | ICD-10-CM | POA: Diagnosis not present

## 2018-11-10 DIAGNOSIS — F209 Schizophrenia, unspecified: Secondary | ICD-10-CM | POA: Diagnosis not present

## 2018-11-10 DIAGNOSIS — G4701 Insomnia due to medical condition: Secondary | ICD-10-CM

## 2018-11-10 DIAGNOSIS — Z23 Encounter for immunization: Secondary | ICD-10-CM | POA: Diagnosis not present

## 2018-11-10 DIAGNOSIS — R69 Illness, unspecified: Secondary | ICD-10-CM | POA: Diagnosis not present

## 2018-11-10 DIAGNOSIS — R338 Other retention of urine: Secondary | ICD-10-CM

## 2018-11-10 MED ORDER — AMLODIPINE BESYLATE 10 MG PO TABS: ORAL_TABLET | ORAL | 3 refills | Status: DC

## 2018-11-10 MED ORDER — ATENOLOL 50 MG PO TABS: ORAL_TABLET | ORAL | 3 refills | Status: DC

## 2018-11-10 MED ORDER — PERPHENAZINE-AMITRIPTYLINE 4-25 MG PO TABS: 1.0000 | ORAL_TABLET | Freq: Every day | ORAL | 3 refills | Status: DC

## 2018-11-10 MED ORDER — FINASTERIDE 5 MG PO TABS: 5.0000 mg | ORAL_TABLET | Freq: Every day | ORAL | 3 refills | Status: DC

## 2018-11-10 MED ORDER — TEMAZEPAM 15 MG PO CAPS: ORAL_CAPSULE | ORAL | 3 refills | Status: DC

## 2018-11-10 NOTE — Patient Instructions (Signed)
Health Maintenance After Age 77 After age 77, you are at a higher risk for certain long-term diseases and infections as well as injuries from falls. Falls are a major cause of broken bones and head injuries in people who are older than age 77. Getting regular preventive care can help to keep you healthy and well. Preventive care includes getting regular testing and making lifestyle changes as recommended by your health care provider. Talk with your health care provider about:  Which screenings and tests you should have. A screening is a test that checks for a disease when you have no symptoms.  A diet and exercise plan that is right for you. What should I know about screenings and tests to prevent falls? Screening and testing are the best ways to find a health problem early. Early diagnosis and treatment give you the best chance of managing medical conditions that are common after age 77. Certain conditions and lifestyle choices may make you more likely to have a fall. Your health care provider may recommend:  Regular vision checks. Poor vision and conditions such as cataracts can make you more likely to have a fall. If you wear glasses, make sure to get your prescription updated if your vision changes.  Medicine review. Work with your health care provider to regularly review all of the medicines you are taking, including over-the-counter medicines. Ask your health care provider about any side effects that may make you more likely to have a fall. Tell your health care provider if any medicines that you take make you feel dizzy or sleepy.  Osteoporosis screening. Osteoporosis is a condition that causes the bones to get weaker. This can make the bones weak and cause them to break more easily.  Blood pressure screening. Blood pressure changes and medicines to control blood pressure can make you feel dizzy.  Strength and balance checks. Your health care provider may recommend certain tests to check your  strength and balance while standing, walking, or changing positions.  Foot health exam. Foot pain and numbness, as well as not wearing proper footwear, can make you more likely to have a fall.  Depression screening. You may be more likely to have a fall if you have a fear of falling, feel emotionally low, or feel unable to do activities that you used to do.  Alcohol use screening. Using too much alcohol can affect your balance and may make you more likely to have a fall. What actions can I take to lower my risk of falls? General instructions  Talk with your health care provider about your risks for falling. Tell your health care provider if: ? You fall. Be sure to tell your health care provider about all falls, even ones that seem minor. ? You feel dizzy, sleepy, or off-balance.  Take over-the-counter and prescription medicines only as told by your health care provider. These include any supplements.  Eat a healthy diet and maintain a healthy weight. A healthy diet includes low-fat dairy products, low-fat (lean) meats, and fiber from whole grains, beans, and lots of fruits and vegetables. Home safety  Remove any tripping hazards, such as rugs, cords, and clutter.  Install safety equipment such as grab bars in bathrooms and safety rails on stairs.  Keep rooms and walkways well-lit. Activity   Follow a regular exercise program to stay fit. This will help you maintain your balance. Ask your health care provider what types of exercise are appropriate for you.  If you need a cane or   walker, use it as recommended by your health care provider.  Wear supportive shoes that have nonskid soles. Lifestyle  Do not drink alcohol if your health care provider tells you not to drink.  If you drink alcohol, limit how much you have: ? 0-1 drink a day for women. ? 0-2 drinks a day for men.  Be aware of how much alcohol is in your drink. In the [place] one drink equals one typical bottle of beer (12  oz), one-half glass of wine (5 oz), or one shot of hard liquor (1 oz).  Do not use any products that contain nicotine or tobacco, such as cigarettes and e-cigarettes. If you need help quitting, ask your health care provider. Summary  Having a healthy lifestyle and getting preventive care can help to protect your health and wellness after age 77.  Screening and testing are the best way to find a health problem early and help you avoid having a fall. Early diagnosis and treatment give you the best chance for managing medical conditions that are more common for people who are older than age 77.  Falls are a major cause of broken bones and head injuries in people who are older than age 77. Take precautions to prevent a fall at home.  Work with your health care provider to learn what changes you can make to improve your health and wellness and to prevent falls. This information is not intended to replace advice given to you by your health care provider. Make sure you discuss any questions you have with your health care provider. Document Released: 02/17/2017 Document Revised: 07/28/2018 Document Reviewed: 02/17/2017 Elsevier Patient Education  2020 [employer]  

## 2018-11-10 NOTE — Progress Notes (Signed)
Patient: Aaron Stokes, Male    DOB: April 29, 1940, 78 y.o.   MRN: [id number] Visit Date: 11/10/2018  Today's Provider: Mar Daring, PA-C   Chief Complaint  Patient presents with  . Annual Exam   Subjective:     Complete Physical Aaron Stokes is a 78 y.o. male. He feels well. He reports exercising some. He reports he is sleeping well. -----------------------------------------------------------   Review of Systems  Constitutional: Positive for fatigue. Negative for activity change, appetite change, chills, diaphoresis, fever and unexpected weight change.  HENT: Positive for dental problem. Negative for congestion, drooling, ear discharge, ear pain, facial swelling, hearing loss, mouth sores, nosebleeds, postnasal drip, rhinorrhea, sinus pressure, sinus pain, sneezing, sore throat, tinnitus, trouble swallowing and voice change.   Eyes: Positive for itching. Negative for photophobia, pain, discharge, redness and visual disturbance.  Respiratory: Negative.   Cardiovascular: Negative.   Gastrointestinal: Negative.   Endocrine: Negative.   Genitourinary: Negative.   Musculoskeletal: Negative.   Skin: Negative.   Allergic/Immunologic: Negative.   Neurological: Positive for weakness. Negative for dizziness, tremors, seizures, syncope, facial asymmetry, speech difficulty, light-headedness, numbness and headaches.  Hematological: Negative.   Psychiatric/Behavioral: Negative.     Social History   Socioeconomic History  . Marital status: Divorced    Spouse name: Not on file  . Number of children: 0  . Years of education: Not on file  . Highest education level: 12th grade  Occupational History  . Occupation: retired  Scientific laboratory technician  . Financial resource strain: Not hard at all  . Food insecurity    Worry: Never true    Inability: Never true  . Transportation needs    Medical: No    Non-medical: No  Tobacco Use  . Smoking status: Never Smoker  . Smokeless  tobacco: Never Used  Substance and Sexual Activity  . Alcohol use: No    Alcohol/week: 0.0 standard drinks  . Drug use: No  . Sexual activity: Not Currently  Lifestyle  . Physical activity    Days per week: Not on file    Minutes per session: Not on file  . Stress: Not at all  Relationships  . Social Herbalist on phone: Not on file    Gets together: Not on file    Attends religious service: Not on file    Active member of club or organization: Not on file    Attends meetings of clubs or organizations: Not on file    Relationship status: Not on file  . Intimate partner violence    Fear of current or ex partner: Not on file    Emotionally abused: Not on file    Physically abused: Not on file    Forced sexual activity: Not on file  Other Topics Concern  . Not on file  Social History Narrative  . Not on file    Past Medical History:  Diagnosis Date  . Acid reflux   . Anxiety   . Chronic kidney disease    UTI; hematuria  . Hypertension   . Insomnia   . Mentally challenged      Patient Active Problem List   Diagnosis Date Noted  . Schizophrenia ([place]) 07/06/2017  . BPH (benign prostatic hypertrophy) with urinary retention 10/23/2015  . Foley catheter problem ([place])   . Hematuria   . Acute urinary retention 11/26/2014  . Hypertension 11/26/2014  . Anxiety 11/26/2014  . Insomnia 11/26/2014    Past  Surgical History:  Procedure Laterality Date  . CIRCUMCISION, NON-NEWBORN    . CYSTOSCOPY WITH STENT PLACEMENT Right 08/21/2015   Procedure: cystoscopy;  Surgeon: Nickie Retort, MD;  Location: [facility];  Service: Urology;  Laterality: Right;  . CYSTOSCOPY WITH STENT PLACEMENT Right 12/11/2015   Procedure: CYSTOSCOPY WITH STENT PLACEMENT;  Surgeon: Nickie Retort, MD;  Location: [facility];  Service: Urology;  Laterality: Right;  . HOLEP-LASER ENUCLEATION OF THE PROSTATE WITH MORCELLATION N/A 10/23/2015   Procedure: HOLEP-LASER ENUCLEATION OF THE PROSTATE WITH  MORCELLATION;  Surgeon: Hollice Espy, MD;  Location: [facility];  Service: Urology;  Laterality: N/A;  . TRANSURETHRAL RESECTION OF BLADDER TUMOR  12/11/2015   Procedure: TRANSURETHRAL RESECTION OF BLADDER TUMOR (TURBT);  Surgeon: Nickie Retort, MD;  Location: [facility];  Service: Urology;;  . URETEROSCOPY WITH HOLMIUM LASER LITHOTRIPSY Right 12/11/2015   Procedure: URETEROSCOPY with biopsy;  Surgeon: Nickie Retort, MD;  Location: [facility];  Service: Urology;  Laterality: Right;    His family history includes Bladder Cancer in his father and paternal grandmother; Cancer in his brother and father. There is no history of Prostate cancer.   Current Outpatient Medications:  .  amLODipine (NORVASC) 10 MG tablet, TAKE 1 TABLET BY MOUTH EVERYDAY AT BEDTIME, Disp: 90 tablet, Rfl: 1 .  atenolol (TENORMIN) 50 MG tablet, TAKE 1 TABLET BY MOUTH EVERYDAY AT BEDTIME, Disp: 90 tablet, Rfl: 1 .  finasteride (PROSCAR) 5 MG tablet, Take 1 tablet (5 mg total) by mouth daily., Disp: 90 tablet, Rfl: 3 .  perphenazine-amitriptyline (ETRAFON/TRIAVIL) 4-25 MG TABS tablet, Take 1 tablet by mouth at bedtime., Disp: 90 tablet, Rfl: 1 .  temazepam (RESTORIL) 15 MG capsule, TAKE 1 CAPSULE BY MOUTH AT BEDTIME AS NEEDED FOR SLEEP, Disp: 90 capsule, Rfl: 1  Patient Care Team: Mar Daring, PA-C as PCP - General (Family Medicine)     Objective:    Vitals: BP 126/70 (BP Location: Right Arm, Patient Position: Sitting, Cuff Size: Large)   Pulse (!) 50   Temp 98.3 F (36.8 C) (Oral)   Wt 189 lb (85.7 kg)   BMI 27.91 kg/m   Physical Exam  Activities of Daily Living No flowsheet data found.  Fall Risk Assessment Fall Risk  09/06/2017 07/06/2017  Falls in the past year? No No     Depression Screen PHQ 2/9 Scores 09/06/2017 07/06/2017  PHQ - 2 Score 0 0  PHQ- 9 Score - 0    No flowsheet data found.     Assessment & Plan:    Annual Physical Reviewed patient's Family Medical History Reviewed  and updated list of patient's medical providers Assessment of cognitive impairment was done Assessed patient's functional ability Established a written schedule for health screening [place] Completed and Reviewed  Exercise Activities and Dietary recommendations Goals    . DIET - INCREASE WATER INTAKE     Recommend increasing water intake to 6 glasses a day.        Immunization History  Administered Date(s) Administered  . Influenza-Unspecified 02/04/2015, 02/19/2017, 02/18/2018  . Zoster Recombinat (Shingrix) 07/21/2016    Health Maintenance  Topic Date Due  . Samul Dada  06/18/1960  . PNA vac Low Risk Adult (1 of 2 - PCV13) 06/19/2006  . INFLUENZA VACCINE  11/19/2018     Discussed health benefits of physical activity, and encouraged him to engage in regular exercise appropriate for his age and condition.    1. Annual physical exam Normal physical exam  today. Will check labs as below and f/u pending lab results. If labs are stable and WNL he will not need to have these rechecked for one year at his next annual physical exam. He is to call the office in the meantime if he has any acute issue, questions or concerns.  2. Essential hypertension Stable. Diagnosis pulled for medication refill. Continue current medical treatment plan. Will check labs as below and f/u pending results. - CBC w/Diff/Platelet - Comprehensive Metabolic Panel (CMET) - Lipid Profile - amLODipine (NORVASC) 10 MG tablet; TAKE 1 TABLET BY MOUTH EVERYDAY AT BEDTIME  Dispense: 90 tablet; Refill: 3 - atenolol (TENORMIN) 50 MG tablet; TAKE 1 TABLET BY MOUTH EVERYDAY AT BEDTIME  Dispense: 90 tablet; Refill: 3  3. Schizophrenia, unspecified type ([place]) Stable. Diagnosis pulled for medication refill. Continue current medical treatment plan. - perphenazine-amitriptyline (ETRAFON/TRIAVIL) 4-25 MG TABS tablet; Take 1 tablet by mouth at bedtime.  Dispense: 90 tablet; Refill: 3  4. Benign  prostatic hyperplasia with urinary retention Stable since 2017 following HoLEP procedure with Dr. Erlene Quan. Continues to use Finasteride with no issues.   5. Incomplete bladder emptying Stable. Diagnosis pulled for medication refill. Continue current medical treatment plan. - finasteride (PROSCAR) 5 MG tablet; Take 1 tablet (5 mg total) by mouth daily.  Dispense: 90 tablet; Refill: 3  6. Insomnia due to medical condition Stable. Diagnosis pulled for medication refill. Continue current medical treatment plan. - perphenazine-amitriptyline (ETRAFON/TRIAVIL) 4-25 MG TABS tablet; Take 1 tablet by mouth at bedtime.  Dispense: 90 tablet; Refill: 3 - temazepam (RESTORIL) 15 MG capsule; TAKE 1 CAPSULE BY MOUTH AT BEDTIME AS NEEDED FOR SLEEP  Dispense: 90 capsule; Refill: 3  7. Need for pneumococcal vaccination Pneumococcal 23 Vaccine given to patient without complications. Patient sat for 15 minutes after administration and was tolerated well without adverse effects. - Pneumococcal polysaccharide vaccine 23-valent greater than or equal to 2yo subcutaneous/IM  ------------------------------------------------------------------------------------------------------------    Mar Daring, PA-C  [facility]

## 2018-11-11 ENCOUNTER — Telehealth: Payer: Self-pay

## 2018-11-11 LAB — CBC WITH DIFFERENTIAL/PLATELET
Basophils Absolute: 0 10*3/uL (ref 0.0–0.2)
Basos: 0 %
EOS (ABSOLUTE): 0 10*3/uL (ref 0.0–0.4)
Eos: 0 %
Hematocrit: 38.5 % (ref 37.5–51.0)
Hemoglobin: 12.6 g/dL — ABNORMAL LOW (ref 13.0–17.7)
Immature Grans (Abs): 0 10*3/uL (ref 0.0–0.1)
Immature Granulocytes: 1 %
Lymphocytes Absolute: 1.4 10*3/uL (ref 0.7–3.1)
Lymphs: 25 %
MCH: 29.4 pg (ref 26.6–33.0)
MCHC: 32.7 g/dL (ref 31.5–35.7)
MCV: 90 fL (ref 79–97)
Monocytes Absolute: 0.5 10*3/uL (ref 0.1–0.9)
Monocytes: 9 %
Neutrophils Absolute: 3.6 10*3/uL (ref 1.4–7.0)
Neutrophils: 65 %
Platelets: 190 10*3/uL (ref 150–450)
RBC: 4.29 x10E6/uL (ref 4.14–5.80)
RDW: 12.2 % (ref 11.6–15.4)
WBC: 5.5 10*3/uL (ref 3.4–10.8)

## 2018-11-11 LAB — COMPREHENSIVE METABOLIC PANEL
ALT: 7 IU/L (ref 0–44)
AST: 14 IU/L (ref 0–40)
Albumin/Globulin Ratio: 1.2 (ref 1.2–2.2)
Albumin: 4.2 g/dL (ref 3.7–4.7)
Alkaline Phosphatase: 82 IU/L (ref 39–117)
BUN/Creatinine Ratio: 12 (ref 10–24)
BUN: 14 mg/dL (ref 8–27)
Bilirubin Total: 1.3 mg/dL — ABNORMAL HIGH (ref 0.0–1.2)
CO2: 23 mmol/L (ref 20–29)
Calcium: 10.3 mg/dL — ABNORMAL HIGH (ref 8.6–10.2)
Chloride: 101 mmol/L (ref 96–106)
Creatinine, Ser: 1.13 mg/dL (ref 0.76–1.27)
GFR calc Af Amer: 72 mL/min/{1.73_m2} (ref >59)
GFR calc non Af Amer: 62 mL/min/{1.73_m2} (ref >59)
Globulin, Total: 3.4 g/dL (ref 1.5–4.5)
Glucose: 80 mg/dL (ref 65–99)
Potassium: 3.9 mmol/L (ref 3.5–5.2)
Sodium: 138 mmol/L (ref 134–144)
Total Protein: 7.6 g/dL (ref 6.0–8.5)

## 2018-11-11 LAB — LIPID PANEL
Chol/HDL Ratio: 2.7 ratio (ref 0.0–5.0)
Cholesterol, Total: 157 mg/dL (ref 100–199)
HDL: 59 mg/dL (ref >39)
LDL Calculated: 88 mg/dL (ref 0–99)
Triglycerides: 50 mg/dL (ref 0–149)
VLDL Cholesterol Cal: 10 mg/dL (ref 5–40)

## 2018-11-11 NOTE — Telephone Encounter (Signed)
LMTCB 11/11/2018   Thanks,   -Mickel Baas

## 2018-11-11 NOTE — Telephone Encounter (Signed)
Pt returned call. Thanks TNP °

## 2018-11-11 NOTE — Telephone Encounter (Signed)
-----   Message from Mar Daring, [place] sent at 11/11/2018 10:21 AM EDT ----- All labs are within normal limits and stable.  Thanks! -JB

## 2018-11-14 NOTE — Telephone Encounter (Signed)
LMTCB 11/14/2018   Thanks,   -Mickel Baas

## 2018-11-15 NOTE — Telephone Encounter (Signed)
Gave labs result to Aaron Stokes who is on patient's DPR

## 2019-01-15 ENCOUNTER — Other Ambulatory Visit: Payer: Self-pay | Admitting: Physician Assistant

## 2019-01-15 DIAGNOSIS — I10 Essential (primary) hypertension: Secondary | ICD-10-CM

## 2019-01-29 ENCOUNTER — Other Ambulatory Visit: Payer: Self-pay | Admitting: Physician Assistant

## 2019-01-29 DIAGNOSIS — I10 Essential (primary) hypertension: Secondary | ICD-10-CM

## 2019-02-21 ENCOUNTER — Other Ambulatory Visit: Payer: Self-pay | Admitting: Physician Assistant

## 2019-02-21 DIAGNOSIS — G4701 Insomnia due to medical condition: Secondary | ICD-10-CM

## 2019-02-23 ENCOUNTER — Telehealth: Payer: Self-pay | Admitting: Physician Assistant

## 2019-02-23 DIAGNOSIS — G4701 Insomnia due to medical condition: Secondary | ICD-10-CM

## 2019-02-23 DIAGNOSIS — F209 Schizophrenia, unspecified: Secondary | ICD-10-CM

## 2019-02-23 NOTE — Telephone Encounter (Signed)
[facility] faxed refill request for the following medications:  perphenazine-amitriptyline (ETRAFON/TRIAVIL) 4-25 MG TABS tablet   Aaron Stokes patient    Please advise.

## 2019-02-24 MED ORDER — PERPHENAZINE-AMITRIPTYLINE 4-25 MG PO TABS: 1.0000 | ORAL_TABLET | Freq: Every day | ORAL | 0 refills | Status: DC

## 2019-02-24 NOTE — Telephone Encounter (Signed)
1 refill sent.  FYI to PCP to use this medication cautiously in elderly patients.  Consider alternatives.

## 2019-03-06 NOTE — Telephone Encounter (Signed)
Please review

## 2019-03-06 NOTE — Telephone Encounter (Signed)
Pa pending. Sent to [employer] for PA today.

## 2019-03-06 NOTE — Telephone Encounter (Signed)
Pt stated he could not pick up rx for perphenazine-amitriptyline (ETRAFON/TRIAVIL) 4-25 MG TABS tablet because the pharmacy told him insurance needs a PA. Please advise. Pt would like to know when he can pick up medication.

## 2019-03-07 ENCOUNTER — Telehealth: Payer: Self-pay | Admitting: Physician Assistant

## 2019-03-07 NOTE — Telephone Encounter (Signed)
[facility] faxed refill request for the following medications:  perphenazine-amitriptyline (ETRAFON/TRIAVIL) 4-25 MG TABS tablet - 90 day  *Please see paperwork sent from [facility] regarding this medication for pt.  Put on green folder to [employer].  Please advise.  Thanks, [employer]

## 2019-03-07 NOTE — Telephone Encounter (Signed)
Prior auth form completed and left on Aaron Stokes's desk to fax

## 2019-03-08 NOTE — Telephone Encounter (Signed)
faxed

## 2019-04-04 ENCOUNTER — Other Ambulatory Visit: Payer: Self-pay | Admitting: Physician Assistant

## 2019-04-04 DIAGNOSIS — R339 Retention of urine, unspecified: Secondary | ICD-10-CM

## 2019-05-05 ENCOUNTER — Other Ambulatory Visit: Payer: Self-pay | Admitting: Physician Assistant

## 2019-05-05 DIAGNOSIS — I10 Essential (primary) hypertension: Secondary | ICD-10-CM

## 2019-05-29 ENCOUNTER — Other Ambulatory Visit: Payer: Self-pay | Admitting: Physician Assistant

## 2019-05-29 DIAGNOSIS — G4701 Insomnia due to medical condition: Secondary | ICD-10-CM

## 2019-05-29 DIAGNOSIS — F209 Schizophrenia, unspecified: Secondary | ICD-10-CM

## 2019-05-29 MED ORDER — PERPHENAZINE-AMITRIPTYLINE 4-25 MG PO TABS: 1.0000 | ORAL_TABLET | Freq: Every day | ORAL | 0 refills | Status: DC

## 2019-05-29 NOTE — Telephone Encounter (Signed)
LOV 11/10/18. Providing courtesy refill.

## 2019-05-29 NOTE — Telephone Encounter (Signed)
Medication: perphenazine-amitriptyline (ETRAFON/TRIAVIL) 4-25 MG TABS tablet [id number]   Has the patient contacted their pharmacy? Yes  (Agent: If no, request that the patient contact the pharmacy for the refill.) (Agent: If yes, when and what did the pharmacy advise?)  Preferred Pharmacy (with phone number or street name):  [facility] [id number], [place] [facility]  Phone:  [phone] Fax:  [phone]    Agent: Please be advised that RX refills may take up to 3 business days. We ask that you follow-up with your pharmacy.

## 2019-05-30 ENCOUNTER — Other Ambulatory Visit: Payer: Self-pay | Admitting: Physician Assistant

## 2019-05-30 DIAGNOSIS — G4701 Insomnia due to medical condition: Secondary | ICD-10-CM

## 2019-05-30 MED ORDER — TEMAZEPAM 15 MG PO CAPS: 15.0000 mg | ORAL_CAPSULE | Freq: Every evening | ORAL | 1 refills | Status: DC | PRN

## 2019-05-30 NOTE — Telephone Encounter (Signed)
[facility] faxed refill request for the following medications:  temazepam (RESTORIL) 15 MG capsule 2nd request   Please advise.  Thanks, [employer]

## 2019-06-02 ENCOUNTER — Telehealth: Payer: Self-pay

## 2019-06-02 NOTE — Telephone Encounter (Signed)
LMTCb to schedule appointment for PAD.  If patient calls back ok for the PEC to schedule patient.

## 2019-06-08 ENCOUNTER — Ambulatory Visit: Payer: Medicare Other | Admitting: Physician Assistant

## 2019-06-12 ENCOUNTER — Encounter: Payer: Self-pay | Admitting: Physician Assistant

## 2019-06-12 ENCOUNTER — Ambulatory Visit (INDEPENDENT_AMBULATORY_CARE_PROVIDER_SITE_OTHER): Payer: Medicare Other | Admitting: Physician Assistant

## 2019-06-12 ENCOUNTER — Other Ambulatory Visit: Payer: Self-pay

## 2019-06-12 VITALS — BP 127/68 | HR 52 | Temp 96.8°F | Wt 211.0 lb

## 2019-06-12 DIAGNOSIS — I739 Peripheral vascular disease, unspecified: Secondary | ICD-10-CM | POA: Insufficient documentation

## 2019-06-12 MED ORDER — ASPIRIN EC 81 MG PO TBEC: 81.0000 mg | DELAYED_RELEASE_TABLET | Freq: Every day | ORAL | 1 refills | Status: DC

## 2019-06-12 NOTE — Progress Notes (Signed)
Patient: Aaron Stokes Male    DOB: 1941-02-08   79 y.o.   MRN: [id number] Visit Date: 06/12/2019  Today's Provider: Mar Daring, PA-C   Chief Complaint  Patient presents with  . Follow-up   Subjective:     HPI   Pt is here to discuss ABI results done by insurance nurse. Had ABIs done that showed moderate PAD. Denies claudication, rest pain, skin changes, open wounds or nail changes.   No Known Allergies   Current Outpatient Medications:  .  amLODipine (NORVASC) 10 MG tablet, TAKE 1 TABLET BY MOUTH EVERYDAY AT BEDTIME, Disp: 90 tablet, Rfl: 1 .  atenolol (TENORMIN) 50 MG tablet, TAKE 1 TABLET BY MOUTH EVERYDAY AT BEDTIME, Disp: 30 tablet, Rfl: 0 .  finasteride (PROSCAR) 5 MG tablet, TAKE 1 TABLET BY MOUTH EVERY DAY, Disp: 90 tablet, Rfl: 1 .  perphenazine-amitriptyline (ETRAFON/TRIAVIL) 4-25 MG TABS tablet, Take 1 tablet by mouth at bedtime., Disp: 90 tablet, Rfl: 0 .  temazepam (RESTORIL) 15 MG capsule, Take 1 capsule (15 mg total) by mouth at bedtime as needed for sleep., Disp: 90 capsule, Rfl: 1  Review of Systems  Constitutional: Negative.   Respiratory: Negative.   Cardiovascular: Negative.   Gastrointestinal: Negative.   Musculoskeletal: Negative for arthralgias, back pain, gait problem, joint swelling, myalgias, neck pain and neck stiffness.  Skin: Negative for color change and wound.  Neurological: Negative for weakness and numbness.    Social History   Tobacco Use  . Smoking status: Never Smoker  . Smokeless tobacco: Never Used  Substance Use Topics  . Alcohol use: No    Alcohol/week: 0.0 standard drinks      Objective:   BP 127/68 (BP Location: Left Arm, Patient Position: Sitting, Cuff Size: Large)   Pulse (!) 52   Temp (!) 96.8 F (36 C) (Temporal)   Wt 211 lb (95.7 kg)   BMI 31.16 kg/m  Vitals:   06/12/19 1421  BP: 127/68  Pulse: (!) 52  Temp: (!) 96.8 F (36 C)  TempSrc: Temporal  Weight: 211 lb (95.7 kg)  Body mass index  is 31.16 kg/m.   Physical Exam Vitals reviewed.  Constitutional:      General: He is not in acute distress.    Appearance: Normal appearance. He is well-developed. He is not ill-appearing or diaphoretic.  HENT:     Head: Normocephalic and atraumatic.  Cardiovascular:     Rate and Rhythm: Normal rate and regular rhythm.     Pulses:          Dorsalis pedis pulses are 1+ on the right side and 1+ on the left side.       Posterior tibial pulses are 1+ on the right side and 1+ on the left side.     Heart sounds: Normal heart sounds. No murmur. No friction rub. No gallop.      Comments: Very faint pulses felt, feet warm, no sores, no skin changes Pulmonary:     Effort: Pulmonary effort is normal. No respiratory distress.     Breath sounds: Normal breath sounds. No wheezing or rales.  Musculoskeletal:     Cervical back: Normal range of motion and neck supple.     Right lower leg: No edema.     Left lower leg: No edema.  Skin:    General: Skin is warm and dry.     Findings: No lesion.  Neurological:     Mental Status: He is  alert.      No results found for any visits on 06/12/19.     Assessment & Plan    1. PAD (peripheral artery disease) ([place]) Patient declines referral to vascular surgery at this time as he is asymptomatic. Discussed importance if he develops any of these symptoms he needs to call as soon as possible for [place] to refer. No limb threatening ischemia noted today. Will start ASA as below. Continue all other medications. Declines a statin. Will f/u in 6 months for CPE/AWV.  - aspirin EC 81 MG tablet; Take 1 tablet (81 mg total) by mouth daily.  Dispense: 90 tablet; Refill: [place], PA-C  [facility]

## 2019-06-12 NOTE — Patient Instructions (Signed)
Peripheral Vascular Disease  Peripheral vascular disease (PVD) is a disease of the blood vessels that are not part of your heart and brain. A simple term for PVD is poor circulation. In most cases, PVD narrows the blood vessels that carry blood from your heart to the rest of your body. This can reduce the supply of blood to your arms, legs, and internal organs, like your stomach or kidneys. However, PVD most often affects a person's lower legs and feet. Without treatment, PVD tends to get worse. PVD can also lead to acute ischemic limb. This is when an arm or leg suddenly cannot get enough blood. This is a medical emergency. Follow these instructions at home: Lifestyle  Do not use any products that contain nicotine or tobacco, such as cigarettes and e-cigarettes. If you need help quitting, ask your doctor.  Lose weight if you are overweight. Or, stay at a healthy weight as told by your doctor.  Eat a diet that is low in fat and cholesterol. If you need help, ask your doctor.  Exercise regularly. Ask your doctor for activities that are right for you. General instructions  Take over-the-counter and prescription medicines only as told by your doctor.  Take good care of your feet: ? Wear comfortable shoes that fit well. ? Check your feet often for any cuts or sores.  Keep all follow-up visits as told by your doctor This is important. Contact a doctor if:  You have cramps in your legs when you walk.  You have leg pain when you are at rest.  You have coldness in a leg or foot.  Your skin changes.  You are unable to get or have an erection (erectile dysfunction).  You have cuts or sores on your feet that do not heal. Get help right away if:  Your arm or leg turns cold, numb, and blue.  Your arms or legs become red, warm, swollen, painful, or numb.  You have chest pain.  You have trouble breathing.  You suddenly have weakness in your face, arm, or leg.  You become very  confused or you cannot speak.  You suddenly have a very bad headache.  You suddenly cannot see. Summary  Peripheral vascular disease (PVD) is a disease of the blood vessels.  A simple term for PVD is poor circulation. Without treatment, PVD tends to get worse.  Treatment may include exercise, low fat and low cholesterol diet, and quitting smoking. This information is not intended to replace advice given to you by your health care provider. Make sure you discuss any questions you have with your health care provider. Document Revised: 03/19/2017 Document Reviewed: 05/14/2016 Elsevier Patient Education  2020 Elsevier Inc.  

## 2019-06-15 ENCOUNTER — Ambulatory Visit: Payer: Self-pay

## 2019-07-09 ENCOUNTER — Other Ambulatory Visit: Payer: Self-pay | Admitting: Physician Assistant

## 2019-07-09 DIAGNOSIS — I10 Essential (primary) hypertension: Secondary | ICD-10-CM

## 2019-07-09 NOTE — Telephone Encounter (Signed)
Patient requesting 90 day supply instead of 30 days. Last ordered 05/05/19. Last OV 06/12/19.

## 2019-07-16 ENCOUNTER — Ambulatory Visit: Payer: Medicare Other | Attending: Internal Medicine

## 2019-07-16 DIAGNOSIS — Z23 Encounter for immunization: Secondary | ICD-10-CM

## 2019-07-16 NOTE — Progress Notes (Signed)
   Covid-19 Vaccination Clinic  Name:  Aaron Stokes    MRN: [id number] DOB: [birth year]  07/16/2019  [name] was observed post Covid-19 immunization for 15 minutes without incident. He was provided with Vaccine Information Sheet and instruction to access the V-Safe system.   [name] was instructed to call 911 with any severe reactions post vaccine: Marland Kitchen Difficulty breathing  . Swelling of face and throat  . A fast heartbeat  . A bad rash all over body  . Dizziness and weakness   Immunizations Administered    Name Date Dose VIS Date Route   Pfizer COVID-19 Vaccine 07/16/2019  8:39 AM 0.3 mL 03/31/2019 Intramuscular   Manufacturer: [place]   Lot: [id number]   [place]: [id number]

## 2019-07-30 ENCOUNTER — Other Ambulatory Visit: Payer: Self-pay | Admitting: Physician Assistant

## 2019-07-30 DIAGNOSIS — I10 Essential (primary) hypertension: Secondary | ICD-10-CM

## 2019-07-30 NOTE — Telephone Encounter (Signed)
Requested Prescriptions  Pending Prescriptions Disp Refills  . amLODipine (NORVASC) 10 MG tablet [Pharmacy Med Name: AMLODIPINE BESYLATE 10 MG TAB] 90 tablet 1    Sig: TAKE 1 TABLET BY MOUTH EVERYDAY AT BEDTIME     Cardiovascular:  Calcium Channel Blockers Passed - 07/30/2019  9:54 AM      Passed - Last BP in normal range    BP Readings from Last 1 Encounters:  06/12/19 127/68         Passed - Valid encounter within last 6 months    Recent Outpatient Visits          1 month ago PAD (peripheral artery disease) [facility])   [facility] [place], Cle[place] Sorrel, PA-C   8 months ago Annual physical exam   [place], Clearnce Sorrel, [place]   1 year ago Annual physical exam   [facility] [place], Clearnce Sorrel, [place]   2 years ago Essential hypertension   [place], [place], [place]

## 2019-08-08 ENCOUNTER — Ambulatory Visit: Payer: Medicare Other | Attending: Internal Medicine

## 2019-08-08 DIAGNOSIS — Z23 Encounter for immunization: Secondary | ICD-10-CM

## 2019-08-08 NOTE — Progress Notes (Signed)
   Covid-19 Vaccination Clinic  Name:  Aaron Stokes    MRN: [id number] DOB: 28-Apr-1940  08/08/2019  [name] was observed post Covid-19 immunization for 15 minutes without incident. He was provided with Vaccine Information Sheet and instruction to access the V-Safe system.   [name] was instructed to call 911 with any severe reactions post vaccine: Marland Kitchen Difficulty breathing  . Swelling of face and throat  . A fast heartbeat  . A bad rash all over body  . Dizziness and weakness   Immunizations Administered    Name Date Dose VIS Date Route   Pfizer COVID-19 Vaccine 08/08/2019 11:31 AM 0.3 mL 06/14/2018 Intramuscular   Manufacturer: [employer]   Lot: [id number]   [place]: [id number]

## 2019-10-12 ENCOUNTER — Other Ambulatory Visit: Payer: Self-pay | Admitting: Physician Assistant

## 2019-10-12 DIAGNOSIS — I10 Essential (primary) hypertension: Secondary | ICD-10-CM

## 2019-10-15 ENCOUNTER — Other Ambulatory Visit: Payer: Self-pay | Admitting: Physician Assistant

## 2019-10-15 DIAGNOSIS — R339 Retention of urine, unspecified: Secondary | ICD-10-CM

## 2019-10-15 NOTE — Telephone Encounter (Signed)
Requested Prescriptions  Pending Prescriptions Disp Refills  . finasteride (PROSCAR) 5 MG tablet [Pharmacy Med Name: FINASTERIDE 5 MG TABLET] 90 tablet 1    Sig: TAKE 1 TABLET BY MOUTH EVERY DAY     Urology: 5-alpha Reductase Inhibitors Passed - 10/15/2019  9:35 AM      Passed - Valid encounter within last 12 months    Recent Outpatient Visits          4 months ago PAD (peripheral artery disease) [facility])   [facility] [place], Clearnce Sorrel, [place]   11 months ago Annual physical exam   [facility] [place], Clearnce Sorrel, [place]   2 years ago Annual physical exam   [facility] [place], Clearnce Sorrel, [place]   2 years ago Essential hypertension   [place], [place], [place]

## 2019-11-21 ENCOUNTER — Other Ambulatory Visit: Payer: Self-pay | Admitting: Physician Assistant

## 2019-11-21 DIAGNOSIS — G4701 Insomnia due to medical condition: Secondary | ICD-10-CM

## 2019-11-21 DIAGNOSIS — F209 Schizophrenia, unspecified: Secondary | ICD-10-CM

## 2019-11-21 NOTE — Telephone Encounter (Signed)
Requested medication (s) are due for refill today: yes  Requested medication (s) are on the active medication list: yes  Last refill:  05/30/19  Future visit scheduled: yes  Notes to clinic:  med not delegated to NT to RF   Requested Prescriptions  Pending Prescriptions Disp Refills   temazepam (RESTORIL) 15 MG capsule [Pharmacy Med Name: TEMAZEPAM 15MG  CAPSULES] 90 capsule     Sig: TAKE 1 CAPSULE(15 MG) BY MOUTH AT BEDTIME AS NEEDED FOR SLEEP      Not Delegated - Psychiatry:  Anxiolytics/Hypnotics Failed - 11/21/2019 11:26 AM      Failed - This refill cannot be delegated      Failed - Urine Drug Screen completed in last 360 days.      Passed - Valid encounter within last 6 months    Recent Outpatient Visits           5 months ago PAD (peripheral artery disease) [facility])   [place], Clearnce Sorrel, [place]   1 year ago Annual physical exam   [facility] [place], Clearnce Sorrel, [place]   2 years ago Annual physical exam   [facility] [place], [place]   2 years ago Essential hypertension   [facility] [place], [place]               Signed Prescriptions Disp Refills   perphenazine-amitriptyline (ETRAFON/TRIAVIL) 4-25 MG TABS tablet 90 tablet 0    Sig: TAKE 1 TABLET BY MOUTH AT BEDTIME      Psychiatry:  Antidepressants - Heterocyclics (TCAs) Passed - 11/21/2019 11:26 AM      Passed - Valid encounter within last 6 months    Recent Outpatient Visits           5 months ago PAD (peripheral artery disease) [facility])   [facility] [place], Clearnce Sorrel, [place]   1 year ago Annual physical exam   [facility] [place], Clearnce Sorrel, [place]   2 years ago Annual physical exam   [facility] [place], Clearnce Sorrel, [place]   2 years ago Essential hypertension   [place], [place], [place]

## 2019-11-21 NOTE — Telephone Encounter (Signed)
Requested Prescriptions  Pending Prescriptions Disp Refills  . temazepam (RESTORIL) 15 MG capsule [Pharmacy Med Name: TEMAZEPAM 15MG  CAPSULES] 90 capsule     Sig: TAKE 1 CAPSULE(15 MG) BY MOUTH AT BEDTIME AS NEEDED FOR SLEEP     Not Delegated - Psychiatry:  Anxiolytics/Hypnotics Failed - 11/21/2019 11:26 AM      Failed - This refill cannot be delegated      Failed - Urine Drug Screen completed in last 360 days.      Passed - Valid encounter within last 6 months    Recent Outpatient Visits          5 months ago PAD (peripheral artery disease) [facility])   [facility], Clearnce Sorrel, [place]   1 year ago Annual physical exam   [facility], [place], [place]   2 years ago Annual physical exam   [facility] [place] M, [place]   2 years ago Essential hypertension   [facility] [place], [place]             . perphenazine-amitriptyline (ETRAFON/TRIAVIL) 4-25 MG TABS tablet [Pharmacy Med Name: PERPHENAZINE/AMITRIPTYLINE 4-25 TAB] 90 tablet 0    Sig: TAKE 1 TABLET BY MOUTH AT BEDTIME     Psychiatry:  Antidepressants - Heterocyclics (TCAs) Passed - 11/21/2019 11:26 AM      Passed - Valid encounter within last 6 months    Recent Outpatient Visits          5 months ago PAD (peripheral artery disease) [facility])   [facility] [address] [place], Clearnce Sorrel, [place]   1 year ago Annual physical exam   [place], Clearnce Sorrel, [place]   2 years ago Annual physical exam   [facility] [place], Clearnce Sorrel, [place]   2 years ago Essential hypertension   [place], [place], [place]

## 2019-12-05 ENCOUNTER — Other Ambulatory Visit: Payer: Self-pay | Admitting: Physician Assistant

## 2019-12-05 DIAGNOSIS — I739 Peripheral vascular disease, unspecified: Secondary | ICD-10-CM

## 2019-12-11 ENCOUNTER — Encounter: Payer: Medicare Other | Admitting: Physician Assistant

## 2020-01-04 ENCOUNTER — Encounter: Payer: Medicare Other | Admitting: Physician Assistant

## 2020-01-05 ENCOUNTER — Ambulatory Visit (INDEPENDENT_AMBULATORY_CARE_PROVIDER_SITE_OTHER): Payer: Medicare Other | Admitting: Physician Assistant

## 2020-01-05 ENCOUNTER — Encounter: Payer: Self-pay | Admitting: Physician Assistant

## 2020-01-05 ENCOUNTER — Other Ambulatory Visit: Payer: Self-pay

## 2020-01-05 VITALS — BP 115/51 | HR 48 | Temp 98.2°F | Resp 16 | Ht 70.0 in | Wt 225.0 lb

## 2020-01-05 DIAGNOSIS — I10 Essential (primary) hypertension: Secondary | ICD-10-CM

## 2020-01-05 DIAGNOSIS — F209 Schizophrenia, unspecified: Secondary | ICD-10-CM | POA: Diagnosis not present

## 2020-01-05 DIAGNOSIS — N401 Enlarged prostate with lower urinary tract symptoms: Secondary | ICD-10-CM | POA: Diagnosis not present

## 2020-01-05 DIAGNOSIS — R338 Other retention of urine: Secondary | ICD-10-CM

## 2020-01-05 DIAGNOSIS — Z1159 Encounter for screening for other viral diseases: Secondary | ICD-10-CM

## 2020-01-05 DIAGNOSIS — Z23 Encounter for immunization: Secondary | ICD-10-CM | POA: Diagnosis not present

## 2020-01-05 DIAGNOSIS — Z0000 Encounter for general adult medical examination without abnormal findings: Secondary | ICD-10-CM

## 2020-01-05 MED ORDER — FINASTERIDE 5 MG PO TABS: 5.0000 mg | ORAL_TABLET | Freq: Every day | ORAL | 1 refills | Status: DC

## 2020-01-05 MED ORDER — AMLODIPINE BESYLATE 10 MG PO TABS: 10.0000 mg | ORAL_TABLET | Freq: Every day | ORAL | 1 refills | Status: DC

## 2020-01-05 MED ORDER — ATENOLOL 50 MG PO TABS: 50.0000 mg | ORAL_TABLET | Freq: Every day | ORAL | 1 refills | Status: DC

## 2020-01-05 NOTE — Patient Instructions (Signed)
Health Maintenance After Age 78 After age 78, you are at a higher risk for certain long-term diseases and infections as well as injuries from falls. Falls are a major cause of broken bones and head injuries in people who are older than age 78. Getting regular preventive care can help to keep you healthy and well. Preventive care includes getting regular testing and making lifestyle changes as recommended by your health care provider. Talk with your health care provider about:  Which screenings and tests you should have. A screening is a test that checks for a disease when you have no symptoms.  A diet and exercise plan that is right for you. What should I know about screenings and tests to prevent falls? Screening and testing are the best ways to find a health problem early. Early diagnosis and treatment give you the best chance of managing medical conditions that are common after age 78. Certain conditions and lifestyle choices may make you more likely to have a fall. Your health care provider may recommend:  Regular vision checks. Poor vision and conditions such as cataracts can make you more likely to have a fall. If you wear glasses, make sure to get your prescription updated if your vision changes.  Medicine review. Work with your health care provider to regularly review all of the medicines you are taking, including over-the-counter medicines. Ask your health care provider about any side effects that may make you more likely to have a fall. Tell your health care provider if any medicines that you take make you feel dizzy or sleepy.  Osteoporosis screening. Osteoporosis is a condition that causes the bones to get weaker. This can make the bones weak and cause them to break more easily.  Blood pressure screening. Blood pressure changes and medicines to control blood pressure can make you feel dizzy.  Strength and balance checks. Your health care provider may recommend certain tests to check your  strength and balance while standing, walking, or changing positions.  Foot health exam. Foot pain and numbness, as well as not wearing proper footwear, can make you more likely to have a fall.  Depression screening. You may be more likely to have a fall if you have a fear of falling, feel emotionally low, or feel unable to do activities that you used to do.  Alcohol use screening. Using too much alcohol can affect your balance and may make you more likely to have a fall. What actions can I take to lower my risk of falls? General instructions  Talk with your health care provider about your risks for falling. Tell your health care provider if: ? You fall. Be sure to tell your health care provider about all falls, even ones that seem minor. ? You feel dizzy, sleepy, or off-balance.  Take over-the-counter and prescription medicines only as told by your health care provider. These include any supplements.  Eat a healthy diet and maintain a healthy weight. A healthy diet includes low-fat dairy products, low-fat (lean) meats, and fiber from whole grains, beans, and lots of fruits and vegetables. Home safety  Remove any tripping hazards, such as rugs, cords, and clutter.  Install safety equipment such as grab bars in bathrooms and safety rails on stairs.  Keep rooms and walkways well-lit. Activity   Follow a regular exercise program to stay fit. This will help you maintain your balance. Ask your health care provider what types of exercise are appropriate for you.  If you need a cane or   walker, use it as recommended by your health care provider.  Wear supportive shoes that have nonskid soles. Lifestyle  Do not drink alcohol if your health care provider tells you not to drink.  If you drink alcohol, limit how much you have: ? 0-1 drink a day for women. ? 0-2 drinks a day for men.  Be aware of how much alcohol is in your drink. In the [place] one drink equals one typical bottle of beer (12  oz), one-half glass of wine (5 oz), or one shot of hard liquor (1 oz).  Do not use any products that contain nicotine or tobacco, such as cigarettes and e-cigarettes. If you need help quitting, ask your health care provider. Summary  Having a healthy lifestyle and getting preventive care can help to protect your health and wellness after age 78.  Screening and testing are the best way to find a health problem early and help you avoid having a fall. Early diagnosis and treatment give you the best chance for managing medical conditions that are more common for people who are older than age 78.  Falls are a major cause of broken bones and head injuries in people who are older than age 78. Take precautions to prevent a fall at home.  Work with your health care provider to learn what changes you can make to improve your health and wellness and to prevent falls. This information is not intended to replace advice given to you by your health care provider. Make sure you discuss any questions you have with your health care provider. Document Revised: 07/28/2018 Document Reviewed: 02/17/2017 Elsevier Patient Education  2020 Elsevier Inc.  

## 2020-01-05 NOTE — Progress Notes (Signed)
Complete physical exam   Patient: Aaron Stokes   DOB: 05-23-1940   79 y.o. Male  MRN: [id number] Visit Date: 01/05/2020  Today's healthcare provider: Mar Daring, PA-C   Chief Complaint  Patient presents with  . Follow-up   Subjective    Aaron Stokes is a 79 y.o. male who presents today for a complete physical exam.  He reports consuming a general diet. Home exercise routine includes walking. He generally feels well. He reports sleeping fairly well. He does not have additional problems to discuss today.  HPI  Patient here to follow up on essential hypertension and PAD. Reports that his BP with the nurse was 118/78.Reports that he exercises sometimes. Diet is regular.  He also had his Hemoglobin A1C 4.7 with a nurse that came to his house.  Past Medical History:  Diagnosis Date  . Acid reflux   . Anxiety   . Chronic kidney disease    UTI; hematuria  . Hypertension   . Insomnia   . Mentally challenged    Past Surgical History:  Procedure Laterality Date  . CIRCUMCISION, NON-NEWBORN    . CYSTOSCOPY WITH STENT PLACEMENT Right 08/21/2015   Procedure: cystoscopy;  Surgeon: Nickie Retort, MD;  Location: [facility] OR[facility];  Service: Urology;  Laterality: Right;  . CYSTOSCOPY WITH STENT PLACEMENT Right 12/11/2015   Procedure: CYSTOSCOPY WITH STENT PLACEMENT;  Surgeon: Nickie Retort, MD;  Location: [facility];  Service: Urology;  Laterality: Right;  . HOLEP-LASER ENUCLEATION OF THE PROSTATE WITH MORCELLATION N/A 10/23/2015   Procedure: HOLEP-LASER ENUCLEATION OF THE PROSTATE WITH MORCELLATION;  Surgeon: Hollice Espy, MD;  Location: [facility] OR[facility];  Service: Urology;  Laterality: N/A;  . TRANSURETHRAL RESECTION OF BLADDER TUMOR  12/11/2015   Procedure: TRANSURETHRAL RESECTION OF BLADDER TUMOR (TURBT);  Surgeon: Nickie Retort, MD;  Location: [facility] OR[facility];  Service: Urology;;  . URETEROSCOPY WITH HOLMIUM LASER LITHOTRIPSY Right 12/11/2015   Procedure: URETEROSCOPY with  biopsy;  Surgeon: Nickie Retort, MD;  Location: [facility] OR[facility];  Service: Urology;  Laterality: Right;   Social History   Socioeconomic History  . Marital status: Divorced    Spouse name: Not on file  . Number of children: 0  . Years of education: Not on file  . Highest education level: 12th grade  Occupational History  . Occupation: retired  Tobacco Use  . Smoking status: Never Smoker  . Smokeless tobacco: Never Used  Vaping Use  . Vaping Use: Never used  Substance and Sexual Activity  . Alcohol use: No    Alcohol/week: 0.0 standard drinks  . Drug use: No  . Sexual activity: Not Currently  Other Topics Concern  . Not on file  Social History Narrative  . Not on file   Social Determinants of Health   Financial Resource Strain:   . Difficulty of Paying Living Expenses: Not on file  Food Insecurity:   . Worried About Charity fundraiser in the Last Year: Not on file  . Ran Out of Food in the Last Year: Not on file  Transportation Needs:   . Lack of Transportation (Medical): Not on file  . Lack of Transportation (Non-Medical): Not on file  Physical Activity:   . Days of Exercise per Week: Not on file  . Minutes of Exercise per Session: Not on file  Stress:   . Feeling of Stress : Not on file  Social Connections:   . Frequency of Communication with Friends and Family: Not  on file  . Frequency of Social Gatherings with Friends and Family: Not on file  . Attends Religious Services: Not on file  . Active Member of Clubs or Organizations: Not on file  . Attends Archivist Meetings: Not on file  . Marital Status: Not on file  Intimate Partner Violence:   . Fear of Current or Ex-Partner: Not on file  . Emotionally Abused: Not on file  . Physically Abused: Not on file  . Sexually Abused: Not on file   Family Status  Relation Name Status  . Father  Deceased  . Mother  Deceased  . PGM  (Not Specified)  . Brother  Alive  . Sister  Alive  . Brother  Alive  .  Sister  Alive  . Sister  Alive  . Neg Hx  (Not Specified)   Family History  Problem Relation Age of Onset  . Bladder Cancer Father   . Cancer Father   . Bladder Cancer Paternal Grandmother   . Cancer Brother   . Prostate cancer Neg Hx    No Known Allergies  Patient Care Team: Rubye Beach as PCP - General (Family Medicine)   Medications: Outpatient Medications Prior to Visit  Medication Sig  . amLODipine (NORVASC) 10 MG tablet TAKE 1 TABLET BY MOUTH EVERYDAY AT BEDTIME  . ASPIRIN ADULT LOW STRENGTH 81 MG EC tablet TAKE 1 TABLET(81 MG) BY MOUTH DAILY  . atenolol (TENORMIN) 50 MG tablet TAKE 1 TABLET BY MOUTH EVERYDAY AT BEDTIME  . finasteride (PROSCAR) 5 MG tablet TAKE 1 TABLET BY MOUTH EVERY DAY  . perphenazine-amitriptyline (ETRAFON/TRIAVIL) 4-25 MG TABS tablet TAKE 1 TABLET BY MOUTH AT BEDTIME  . temazepam (RESTORIL) 15 MG capsule TAKE 1 CAPSULE(15 MG) BY MOUTH AT BEDTIME AS NEEDED FOR SLEEP   No facility-administered medications prior to visit.    Review of Systems  Constitutional: Negative.   HENT: Negative.   Eyes: Negative.   Respiratory: Negative.   Cardiovascular: Negative.   Gastrointestinal: Negative.   Endocrine: Negative.   Genitourinary: Negative.   Musculoskeletal: Negative.   Skin: Negative.   Allergic/Immunologic: Negative.   Neurological: Negative.   Hematological: Negative.   Psychiatric/Behavioral: Negative.     Last CBC Lab Results  Component Value Date   WBC 5.5 11/10/2018   HGB 12.6 (L) 11/10/2018   HCT 38.5 11/10/2018   MCV 90 11/10/2018   MCH 29.4 11/10/2018   RDW 12.2 11/10/2018   PLT 190 40/01/2724   Last metabolic panel Lab Results  Component Value Date   GLUCOSE 80 11/10/2018   NA 138 11/10/2018   K 3.9 11/10/2018   CL 101 11/10/2018   CO2 23 11/10/2018   BUN 14 11/10/2018   CREATININE 1.13 11/10/2018   GFRNONAA 62 11/10/2018   GFRAA 72 11/10/2018   CALCIUM 10.3 (H) 11/10/2018   PROT 7.6 11/10/2018    ALBUMIN 4.2 11/10/2018   LABGLOB 3.4 11/10/2018   AGRATIO 1.2 11/10/2018   BILITOT 1.3 (H) 11/10/2018   ALKPHOS 82 11/10/2018   AST 14 11/10/2018   ALT 7 11/10/2018   ANIONGAP 2 (L) 12/05/2015      Objective    BP (!) 115/51 (BP Location: Left Arm, Patient Position: Sitting, Cuff Size: Large)   Pulse (!) 48   Temp 98.2 F (36.8 C) (Oral)   Resp 16   Ht 5\' 10"  (1.778 m)   Wt 225 lb (102.1 kg)   BMI 32.28 kg/m  BP Readings from Last 3  Encounters:  01/05/20 (!) 115/51  06/12/19 127/68  11/10/18 126/70   Wt Readings from Last 3 Encounters:  01/05/20 225 lb (102.1 kg)  06/12/19 211 lb (95.7 kg)  11/10/18 189 lb (85.7 kg)      Physical Exam Vitals reviewed.  Constitutional:      General: He is not in acute distress.    Appearance: Normal appearance. He is well-developed. He is obese. He is not ill-appearing.  HENT:     Head: Normocephalic and atraumatic.     Right Ear: Tympanic membrane, ear canal and external ear normal.     Left Ear: Tympanic membrane, ear canal and external ear normal.  Eyes:     General: No scleral icterus.       Right eye: No discharge.        Left eye: No discharge.     Extraocular Movements: Extraocular movements intact.     Conjunctiva/sclera: Conjunctivae normal.     Pupils: Pupils are equal, round, and reactive to light.  Neck:     Thyroid: No thyromegaly.     Vascular: No carotid bruit.     Trachea: No tracheal deviation.  Cardiovascular:     Rate and Rhythm: Normal rate and regular rhythm.     Pulses: Normal pulses.     Heart sounds: Normal heart sounds. No murmur heard.   Pulmonary:     Effort: Pulmonary effort is normal. No respiratory distress.     Breath sounds: Normal breath sounds. No wheezing or rales.  Chest:     Chest wall: No tenderness.  Abdominal:     General: Abdomen is protuberant. There is no distension.     Palpations: Abdomen is soft. There is no mass.     Tenderness: There is no abdominal tenderness. There is  no guarding or rebound.  Musculoskeletal:        General: No tenderness. Normal range of motion.     Cervical back: Normal range of motion and neck supple.     Right lower leg: No edema.     Left lower leg: No edema.  Lymphadenopathy:     Cervical: No cervical adenopathy.  Skin:    General: Skin is warm and dry.     Capillary Refill: Capillary refill takes less than 2 seconds.     Findings: No erythema or rash.  Neurological:     General: No focal deficit present.     Mental Status: He is alert and oriented to person, place, and time. Mental status is at baseline.     Cranial Nerves: No cranial nerve deficit.     Motor: No abnormal muscle tone.     Coordination: Coordination normal.     Deep Tendon Reflexes: Reflexes are normal and symmetric. Reflexes normal.  Psychiatric:        Mood and Affect: Mood normal.        Behavior: Behavior normal.        Thought Content: Thought content normal.        Judgment: Judgment normal.       Last depression screening scores PHQ 2/9 Scores 01/05/2020 11/10/2018 09/06/2017  PHQ - 2 Score 0 0 0  PHQ- 9 Score - - -   Last fall risk screening Fall Risk  06/12/2019  Falls in the past year? 0  Number falls in past yr: 0  Injury with Fall? 0  Follow up Falls evaluation completed   Last Audit-C alcohol use screening No flowsheet data found. A score  of 3 or more in women, and 4 or more in men indicates increased risk for alcohol abuse, EXCEPT if all of the points are from question 1   No results found for any visits on 01/05/20.  Assessment & Plan    Routine Health Maintenance and Physical Exam  Exercise Activities and Dietary recommendations Goals    . DIET - INCREASE WATER INTAKE     Recommend increasing water intake to 6 glasses a day.        Immunization History  Administered Date(s) Administered  . Influenza, High Dose Seasonal PF 02/21/2019  . Influenza-Unspecified 02/04/2015, 02/19/2017, 02/18/2018  . PFIZER SARS-COV-2  Vaccination 07/16/2019, 08/08/2019  . Pneumococcal Polysaccharide-23 11/10/2018  . Zoster Recombinat (Shingrix) 07/21/2016    Health Maintenance  Topic Date Due  . Hepatitis C Screening  Never done  . TETANUS/TDAP  Never done  . PNA vac Low Risk Adult (2 of 2 - PCV13) 11/10/2019  . INFLUENZA VACCINE  11/19/2019  . COVID-19 Vaccine  Completed    Discussed health benefits of physical activity, and encouraged him to engage in regular exercise appropriate for his age and condition.  1. Medicare annual wellness visit, subsequent Normal exam. Vaccinations updated. Up to date on screening exams.   2. Benign prostatic hyperplasia with urinary retention Stable. Patient declines any symptoms. Continue Finasteride 5mg . Will check labs as below and f/u pending results. - CBC with Differential/Platelet - Comprehensive metabolic panel - finasteride (PROSCAR) 5 MG tablet; Take 1 tablet (5 mg total) by mouth daily.  Dispense: 90 tablet; Refill: 1  3. Essential hypertension Stable. Diagnosis pulled for medication refill. Continue current medical treatment plan. Will check labs as below and f/u pending results. - CBC with Differential/Platelet - Comprehensive metabolic panel - Lipid panel - amLODipine (NORVASC) 10 MG tablet; Take 1 tablet (10 mg total) by mouth daily.  Dispense: 90 tablet; Refill: 1 - atenolol (TENORMIN) 50 MG tablet; Take 1 tablet (50 mg total) by mouth daily.  Dispense: 90 tablet; Refill: 1  4. Schizophrenia, unspecified type ([place]) Stable. Continue Perphenazine-amitriptyline 4-25mg . Will check labs as below and f/u pending results. - CBC with Differential/Platelet - Comprehensive metabolic panel  5. Need for influenza vaccination Flu vaccine given today without complication. Patient sat upright for 15 minutes to check for adverse reaction before being released. - Flu Vaccine QUAD High Dose(Fluad)  6. Need for pneumococcal vaccination Prevnar 13 Vaccine given to patient  without complications. Patient sat for 15 minutes after administration and was tolerated well without adverse effects. - Pneumococcal conjugate vaccine 13-valent  7. Need for hepatitis C screening test Will check labs as below and f/u pending results. - Hepatitis C Antibody     No follow-ups on file.     Reynolds Bowl, PA-C, have reviewed all documentation for this visit. The documentation on 01/05/20 for the exam, diagnosis, procedures, and orders are all accurate and complete.   Rubye Beach  [facility] [phone] (phone) [phone] (fax)  [place]

## 2020-01-06 LAB — COMPREHENSIVE METABOLIC PANEL
ALT: 9 IU/L (ref 0–44)
AST: 17 IU/L (ref 0–40)
Albumin/Globulin Ratio: 1.3 (ref 1.2–2.2)
Albumin: 4.4 g/dL (ref 3.7–4.7)
Alkaline Phosphatase: 93 IU/L (ref 44–121)
BUN/Creatinine Ratio: 9 — ABNORMAL LOW (ref 10–24)
BUN: 12 mg/dL (ref 8–27)
Bilirubin Total: 1.4 mg/dL — ABNORMAL HIGH (ref 0.0–1.2)
CO2: 28 mmol/L (ref 20–29)
Calcium: 10 mg/dL (ref 8.6–10.2)
Chloride: 100 mmol/L (ref 96–106)
Creatinine, Ser: 1.35 mg/dL — ABNORMAL HIGH (ref 0.76–1.27)
GFR calc Af Amer: 58 mL/min/{1.73_m2} — ABNORMAL LOW (ref >59)
GFR calc non Af Amer: 50 mL/min/{1.73_m2} — ABNORMAL LOW (ref >59)
Globulin, Total: 3.4 g/dL (ref 1.5–4.5)
Glucose: 87 mg/dL (ref 65–99)
Potassium: 4.2 mmol/L (ref 3.5–5.2)
Sodium: 139 mmol/L (ref 134–144)
Total Protein: 7.8 g/dL (ref 6.0–8.5)

## 2020-01-06 LAB — CBC WITH DIFFERENTIAL/PLATELET
Basophils Absolute: 0 10*3/uL (ref 0.0–0.2)
Basos: 1 %
EOS (ABSOLUTE): 0 10*3/uL (ref 0.0–0.4)
Eos: 0 %
Hematocrit: 37.5 % (ref 37.5–51.0)
Hemoglobin: 12.9 g/dL — ABNORMAL LOW (ref 13.0–17.7)
Immature Grans (Abs): 0 10*3/uL (ref 0.0–0.1)
Immature Granulocytes: 1 %
Lymphocytes Absolute: 1.5 10*3/uL (ref 0.7–3.1)
Lymphs: 24 %
MCH: 31.2 pg (ref 26.6–33.0)
MCHC: 34.4 g/dL (ref 31.5–35.7)
MCV: 91 fL (ref 79–97)
Monocytes Absolute: 0.4 10*3/uL (ref 0.1–0.9)
Monocytes: 7 %
Neutrophils Absolute: 4.2 10*3/uL (ref 1.4–7.0)
Neutrophils: 67 %
Platelets: 216 10*3/uL (ref 150–450)
RBC: 4.14 x10E6/uL (ref 4.14–5.80)
RDW: 11.9 % (ref 11.6–15.4)
WBC: 6.2 10*3/uL (ref 3.4–10.8)

## 2020-01-06 LAB — LIPID PANEL
Chol/HDL Ratio: 2.8 ratio (ref 0.0–5.0)
Cholesterol, Total: 168 mg/dL (ref 100–199)
HDL: 61 mg/dL (ref >39)
LDL Chol Calc (NIH): 97 mg/dL (ref 0–99)
Triglycerides: 50 mg/dL (ref 0–149)
VLDL Cholesterol Cal: 10 mg/dL (ref 5–40)

## 2020-01-06 LAB — HEPATITIS C ANTIBODY: Hep C Virus Ab: 0.2 s/co ratio (ref 0.0–0.9)

## 2020-01-08 ENCOUNTER — Telehealth: Payer: Self-pay

## 2020-01-08 DIAGNOSIS — N289 Disorder of kidney and ureter, unspecified: Secondary | ICD-10-CM

## 2020-01-08 NOTE — Telephone Encounter (Signed)
-----   Message from Mar Daring, PA-C sent at 01/08/2020  8:29 AM EDT ----- Blood count is normal and stable. Kidney function has declined slightly. This can be due to dehydration. Would recommend to push fluids, stay well hydrated and we can recheck kidney function in 2-3 months. Liver enzymes are normal. Sugar is normal. Sodium, potassium and calcium are normal. Cholesterol is normal. Hepatitis C screen is negative.

## 2020-01-08 NOTE — Telephone Encounter (Signed)
Aaron Stokes patient's brother in law [facility]) advised of patients results.

## 2020-01-11 ENCOUNTER — Other Ambulatory Visit: Payer: Self-pay | Admitting: Physician Assistant

## 2020-01-11 DIAGNOSIS — I10 Essential (primary) hypertension: Secondary | ICD-10-CM

## 2020-02-21 ENCOUNTER — Other Ambulatory Visit: Payer: Self-pay | Admitting: Physician Assistant

## 2020-02-21 DIAGNOSIS — G4701 Insomnia due to medical condition: Secondary | ICD-10-CM

## 2020-02-21 DIAGNOSIS — F209 Schizophrenia, unspecified: Secondary | ICD-10-CM

## 2020-02-22 ENCOUNTER — Telehealth: Payer: Self-pay

## 2020-02-22 LAB — RENAL FUNCTION PANEL
Albumin: 4.2 g/dL (ref 3.7–4.7)
BUN/Creatinine Ratio: 8 — ABNORMAL LOW (ref 10–24)
BUN: 10 mg/dL (ref 8–27)
CO2: 25 mmol/L (ref 20–29)
Calcium: 9.9 mg/dL (ref 8.6–10.2)
Chloride: 98 mmol/L (ref 96–106)
Creatinine, Ser: 1.3 mg/dL — ABNORMAL HIGH (ref 0.76–1.27)
GFR calc Af Amer: 60 mL/min/{1.73_m2} (ref >59)
GFR calc non Af Amer: 52 mL/min/{1.73_m2} — ABNORMAL LOW (ref >59)
Glucose: 95 mg/dL (ref 65–99)
Phosphorus: 2.4 mg/dL — ABNORMAL LOW (ref 2.8–4.1)
Potassium: 4.2 mmol/L (ref 3.5–5.2)
Sodium: 137 mmol/L (ref 134–144)

## 2020-02-22 NOTE — Telephone Encounter (Signed)
[name] advised.  (On DPR)  Thanks,   -Mickel Baas

## 2020-02-22 NOTE — Telephone Encounter (Signed)
-----   Message from Mar Daring, PA-C sent at 02/22/2020  9:19 AM EDT ----- Renal function has improved slightly. Continue to stay well hydrated and try to avoid NSAID drugs like ibuprofen and aleve

## 2020-03-18 ENCOUNTER — Ambulatory Visit: Payer: Medicare Other

## 2020-05-23 ENCOUNTER — Other Ambulatory Visit: Payer: Self-pay | Admitting: Physician Assistant

## 2020-05-23 DIAGNOSIS — G4701 Insomnia due to medical condition: Secondary | ICD-10-CM

## 2020-05-23 DIAGNOSIS — F209 Schizophrenia, unspecified: Secondary | ICD-10-CM

## 2020-05-23 NOTE — Telephone Encounter (Signed)
Requested medication (s) are due for refill today: yes  Requested medication (s) are on the active medication list: yes  Last refill:  02/21/2020  Future visit scheduled: no  Notes to clinic:  this refill cannot be delegated    Requested Prescriptions  Pending Prescriptions Disp Refills   temazepam (RESTORIL) 15 MG capsule [Pharmacy Med Name: TEMAZEPAM 15MG  CAPSULES] 90 capsule     Sig: TAKE 1 CAPSULE(15 MG) BY MOUTH AT BEDTIME AS NEEDED FOR SLEEP      Not Delegated - Psychiatry:  Anxiolytics/Hypnotics Failed - 05/23/2020 11:27 AM      Failed - This refill cannot be delegated      Failed - Urine Drug Screen completed in last 360 days      Passed - Valid encounter within last 6 months    Recent Outpatient Visits           4 months ago Medicare annual wellness visit, subsequent   [place], [place], [place]   11 months ago PAD (peripheral artery disease) [facility])   [place], Cle[place] Sorrel, [place]   1 year ago Annual physical exam   [facility] [place], [place], [place]   2 years ago Annual physical exam   [place], [place], [place]   2 years ago Essential hypertension   [place], [place], [place]                 Signed Prescriptions Disp Refills   perphenazine-amitriptyline (ETRAFON/TRIAVIL) 4-25 MG TABS tablet 90 tablet 0    Sig: TAKE 1 TABLET BY MOUTH AT BEDTIME      Psychiatry:  Antidepressants - Heterocyclics (TCAs) Passed - 05/23/2020 11:27 AM      Passed - Valid encounter within last 6 months    Recent Outpatient Visits           4 months ago Medicare annual wellness visit, subsequent   [place], [place], [place]   11 months ago PAD (peripheral artery disease) [facility])   [place], Clearn[place], [place]   1 year ago Annual physical exam   [place], Cle[place], [place]   2  years ago Annual physical exam   [facility] [place], [place], [place]   2 years ago Essential hypertension   [place], [place], [place]

## 2020-06-06 ENCOUNTER — Other Ambulatory Visit: Payer: Self-pay | Admitting: Physician Assistant

## 2020-06-06 DIAGNOSIS — I739 Peripheral vascular disease, unspecified: Secondary | ICD-10-CM

## 2020-06-06 NOTE — Telephone Encounter (Signed)
Requested Prescriptions  Pending Prescriptions Disp Refills  . ASPIRIN LOW DOSE 81 MG EC tablet [Pharmacy Med Name: ASPIRIN 81MG  EC LOW DOSE TABLETS] 90 tablet 1    Sig: TAKE 1 TABLET(81 MG) BY MOUTH DAILY     Analgesics:  NSAIDS - aspirin Passed - 06/06/2020  2:49 PM      Passed - Patient is not pregnant      Passed - Valid encounter within last 12 months    Recent Outpatient Visits          5 months ago Medicare annual wellness visit, subsequent   [place], [place]   12 months ago PAD (peripheral artery disease) [facility])   [place], Clearnce Sor[place], [place]   1 year ago Annual physical exam   [place], Clearnce [place], [place]   2 years ago Annual physical exam   [facility] [place], [place], [place]   2 years ago Essential hypertension   [place], [place], [place]

## 2020-07-04 ENCOUNTER — Telehealth: Payer: Self-pay

## 2020-07-04 NOTE — Telephone Encounter (Signed)
Copied from [place] [id number]. Topic: General - Other >> Jul 02, 2020  3:06 PM Celene Kras wrote: Reason for CRM: Pts Brother in Estate manager/land agent on behlaf of pt stating that the pt was advised that he needed to be seen every 6 months by PCP. He is requesting to have clarification since there is no appt scheduled. Please advise.

## 2020-07-04 NOTE — Telephone Encounter (Signed)
Explained to Mr. Aaron Stokes that Mr. Aaron Stokes needs to be seen every six months for either a physical or follow-up (What ever he is due for at that time.)  Mr. Aaron Stokes understands now and will have Mr. Aaron Stokes here for his appointment 07/18/2020  Thanks,   -Mickel Baas

## 2020-07-18 ENCOUNTER — Ambulatory Visit: Payer: Medicare Other | Admitting: Physician Assistant

## 2020-07-22 ENCOUNTER — Other Ambulatory Visit: Payer: Self-pay | Admitting: Physician Assistant

## 2020-07-22 DIAGNOSIS — I10 Essential (primary) hypertension: Secondary | ICD-10-CM

## 2020-07-22 DIAGNOSIS — N401 Enlarged prostate with lower urinary tract symptoms: Secondary | ICD-10-CM

## 2020-07-22 NOTE — Telephone Encounter (Signed)
Notes to clinic: Patient has upcoming appt on 08/05/2020 Review for refill until that time   Requested Prescriptions  Pending Prescriptions Disp Refills   finasteride (PROSCAR) 5 MG tablet [Pharmacy Med Name: FINASTERIDE 5MG  TABLETS] 90 tablet 1    Sig: TAKE 1 TABLET(5 MG) BY MOUTH DAILY      Urology: 5-alpha Reductase Inhibitors Passed - 07/22/2020 11:31 AM      Passed - Valid encounter within last 12 months    Recent Outpatient Visits           6 months ago Medicare annual wellness visit, subsequent   [employer], [place], PA-C   1 year ago PAD (peripheral artery disease) [facility])   [facility] [place], Clearnce Sorrel, [place]   1 year ago Annual physical exam   [facility] [place], Clearnce Sorrel, [place]   2 years ago Annual physical exam   [facility] [place], Clearnce Sorrel, [place]   3 years ago Essential hypertension   [place], Clearnce Sorrel, [place]       Future Appointments             In 2 weeks Fl[place], Kelby Aline, [place], [facility]               amLODipine ([place]) 10 MG tablet [Pharmacy Med Name: AMLODIPINE BESYLATE 10MG  TABLETS] 90 tablet 1    Sig: TAKE 1 TABLET(10 MG) BY MOUTH DAILY      Cardiovascular:  Calcium Channel Blockers Failed - 07/22/2020 11:31 AM      Failed - Valid encounter within last 6 months    Recent Outpatient Visits           6 months ago Medicare annual wellness visit, subsequent   [place], [place], PA-C   1 year ago PAD (peripheral artery disease) [facility])   [facility] [place], Clearnce Sorrel, [place]   1 year ago Annual physical exam   [facility], Clearnce Sorrel, [place]   2 years ago Annual physical exam   [facility] [place], Clearnce Sorrel, [place]   3 years ago Essential hypertension   [place], Clearnce Sorrel, [place]       Future Appointments              In 2 weeks Flinchum, Kelby Aline, [place], [place] - Last BP in normal range    BP Readings from Last 1 Encounters:  01/05/20 (!) 115/51            atenolol (TENORMIN) 50 MG tablet [Pharmacy Med Name: ATENOLOL 50MG  TABLETS] 90 tablet 1    Sig: TAKE 1 TABLET(50 MG) BY MOUTH DAILY      Cardiovascular:  Beta Blockers Failed - 07/22/2020 11:31 AM      Failed - Valid encounter within last 6 months    Recent Outpatient Visits           6 months ago Medicare annual wellness visit, subsequent   [place], [place], PA-C   1 year ago PAD (peripheral artery disease) [facility])   [place], Clearnce Sorrel, [place]   1 year ago Annual physical exam   [place], Clearnce Sorrel, [place]   2 years ago Annual physical exam   [facility] [place] M, [place]   3 years ago Essential  hypertension   [place], [place]       Future Appointments             In 2 weeks Flinchum, Kelby Aline, [place], [facility]             Passed - Last BP in normal range    BP Readings from Last 1 Encounters:  01/05/20 (!) 115/51          Passed - Last Heart Rate in normal range    Pulse Readings from Last 1 Encounters:  01/05/20 (!) 48

## 2020-08-05 ENCOUNTER — Encounter: Payer: Self-pay | Admitting: Adult Health

## 2020-08-05 ENCOUNTER — Other Ambulatory Visit: Payer: Self-pay

## 2020-08-05 ENCOUNTER — Ambulatory Visit (INDEPENDENT_AMBULATORY_CARE_PROVIDER_SITE_OTHER): Payer: Medicare Other | Admitting: Adult Health

## 2020-08-05 VITALS — BP 135/71 | HR 54 | Temp 97.8°F | Wt 236.4 lb

## 2020-08-05 DIAGNOSIS — R338 Other retention of urine: Secondary | ICD-10-CM

## 2020-08-05 DIAGNOSIS — N401 Enlarged prostate with lower urinary tract symptoms: Secondary | ICD-10-CM

## 2020-08-05 DIAGNOSIS — I10 Essential (primary) hypertension: Secondary | ICD-10-CM | POA: Diagnosis not present

## 2020-08-05 NOTE — Progress Notes (Signed)
Established patient visit   Patient: Aaron Stokes   DOB: 12-Aug-1940   80 y.o. Male  MRN: [id number] Visit Date: 08/05/2020  Today's healthcare provider: Marcille Buffy, FNP   Chief Complaint  Patient presents with  . Hypertension   I,Porsha C McClurkin,acting as a scribe for [employer], FNP.,have documented all relevant documentation on the behalf of Marcille Buffy, FNP,as directed by  Marcille Buffy, FNP while in the presence of Marcille Buffy, [place].  Subjective    HPI  Hypertension, follow-up  BP Readings from Last 3 Encounters:  08/05/20 135/71  01/05/20 (!) 115/51  06/12/19 127/68   Wt Readings from Last 3 Encounters:  08/05/20 236 lb 6.4 oz (107.2 kg)  01/05/20 225 lb (102.1 kg)  06/12/19 211 lb (95.7 kg)     He was last seen for hypertension 7 months ago.  BP at that visit was 115/51. Management since that visit includes continue current medication.  He reports good compliance with treatment. He is not having side effects.  He is following a Regular diet. He is not exercising. He does not smoke.  Use of agents associated with hypertension: none.   Outside blood pressures are not being checked. Symptoms: No chest pain No chest pressure  No palpitations No syncope  No dyspnea No orthopnea  No paroxysmal nocturnal dyspnea No lower extremity edema   Pertinent labs: Lab Results  Component Value Date   CHOL 168 01/05/2020   HDL 61 01/05/2020   LDLCALC 97 01/05/2020   TRIG 50 01/05/2020   CHOLHDL 2.8 01/05/2020   Lab Results  Component Value Date   NA 137 02/21/2020   K 4.2 02/21/2020   CREATININE 1.30 (H) 02/21/2020   GFRNONAA 52 (L) 02/21/2020   GFRAA 60 02/21/2020   GLUCOSE 95 02/21/2020     The 10-year ASCVD risk score Mikey Bussing DC Jr., et al., 2013) is: 24.5%  Patient  denies any fever, body aches,chills, rash, chest pain, shortness of breath, nausea, vomiting, or diarrhea.  Denies dizziness,  lightheadedness, pre syncopal or syncopal episodes.         Medications: Outpatient Medications Prior to Visit  Medication Sig  . amLODipine (NORVASC) 10 MG tablet TAKE 1 TABLET(10 MG) BY MOUTH DAILY  . ASPIRIN LOW DOSE 81 MG EC tablet TAKE 1 TABLET(81 MG) BY MOUTH DAILY  . atenolol (TENORMIN) 50 MG tablet TAKE 1 TABLET(50 MG) BY MOUTH DAILY  . finasteride (PROSCAR) 5 MG tablet TAKE 1 TABLET(5 MG) BY MOUTH DAILY  . perphenazine-amitriptyline (ETRAFON/TRIAVIL) 4-25 MG TABS tablet TAKE 1 TABLET BY MOUTH AT BEDTIME  . temazepam (RESTORIL) 15 MG capsule TAKE 1 CAPSULE(15 MG) BY MOUTH AT BEDTIME AS NEEDED FOR SLEEP   No facility-administered medications prior to visit.    Review of Systems  Constitutional: Negative.   Respiratory: Negative.   Cardiovascular: Negative.   Hematological: Negative.        Objective    BP 135/71 (BP Location: Left Arm, Patient Position: Sitting, Cuff Size: Normal)   Pulse (!) 54   Temp 97.8 F (36.6 C) (Oral)   Wt 236 lb 6.4 oz (107.2 kg)   SpO2 100%   BMI 33.92 kg/m     Physical Exam Vitals reviewed.  Constitutional:      General: He is not in acute distress.    Appearance: Normal appearance. He is obese. He is not ill-appearing, toxic-appearing or diaphoretic.  HENT:     Right Ear: External  ear normal.     Left Ear: External ear normal.     Nose: Nose normal.     Mouth/Throat:     Mouth: Mucous membranes are moist.     Pharynx: No oropharyngeal exudate or posterior oropharyngeal erythema.  Eyes:     General: No scleral icterus.    Pupils: Pupils are equal, round, and reactive to light.  Neck:     Vascular: No carotid bruit.  Cardiovascular:     Rate and Rhythm: Normal rate and regular rhythm.     Pulses: Normal pulses.     Heart sounds: Normal heart sounds. No murmur heard. No friction rub. No gallop.   Pulmonary:     Effort: Pulmonary effort is normal. No respiratory distress.     Breath sounds: Normal breath sounds. No  stridor. No wheezing, rhonchi or rales.  Chest:     Chest wall: No tenderness.  Abdominal:     General: Bowel sounds are normal. There is no distension.     Palpations: Abdomen is soft.     Tenderness: There is no abdominal tenderness.  Musculoskeletal:        General: Normal range of motion.     Cervical back: Normal range of motion and neck supple. No rigidity or tenderness.  Lymphadenopathy:     Cervical: No cervical adenopathy.  Skin:    General: Skin is warm.     Findings: No erythema or rash.  Neurological:     Mental Status: He is oriented to person, place, and time.    No results found for any visits on 08/05/20.  Assessment & Plan     Hypertension, unspecified type - Plan: CBC with Differential/Platelet, Comprehensive Metabolic Panel (CMET), TSH  Benign prostatic hyperplasia with urinary retention - Plan: PSA   No orders of the defined types were placed in this encounter.  Orders Placed This Encounter  Procedures  . CBC with Differential/Platelet  . Comprehensive Metabolic Panel (CMET)  . TSH  . PSA   Return in about 3 months (around 11/04/2020), or if symptoms worsen or fail to improve, for at any time for any worsening symptoms, Go to Emergency room/ urgent care if worse.      The entirety of the information documented in the History of Present Illness, Review of Systems and Physical Exam were personally obtained by me. Portions of this information were initially documented by the CMA and reviewed by me for thoroughness and accuracy.   Red Flags discussed. The patient was given clear instructions to go to ER or return to medical center if any red flags develop, symptoms do not improve, worsen or new problems develop. They verbalized understanding.   Marcille Buffy, [place] [phone] (phone) [phone] (fax)  [place]

## 2020-08-05 NOTE — Patient Instructions (Signed)
Benign Prostatic Hyperplasia  Benign prostatic hyperplasia (BPH) is an enlarged prostate gland that is caused by the normal aging process and not by cancer. The prostate is a walnut-sized gland that is involved in the production of semen. It is located in front of the rectum and below the bladder. The bladder stores urine and the urethra is the tube that carries the urine out of the body. The prostate may get bigger as a man gets older. An enlarged prostate can press on the urethra. This can make it harder to pass urine. The build-up of urine in the bladder can cause infection. Back pressure and infection may progress to bladder damage and kidney (renal) failure. What are the causes? This condition is part of a normal aging process. However, not all men develop problems from this condition. If the prostate enlarges away from the urethra, urine flow will not be blocked. If it enlarges toward the urethra and compresses it, there will be problems passing urine. What increases the risk? This condition is more likely to develop in men over the age of 50 years. What are the signs or symptoms? Symptoms of this condition include:  Getting up often during the night to urinate.  Needing to urinate frequently during the day.  Difficulty starting urine flow.  Decrease in size and strength of your urine stream.  Leaking (dribbling) after urinating.  Inability to pass urine. This needs immediate treatment.  Inability to completely empty your bladder.  Pain when you pass urine. This is more common if there is also an infection.  Urinary tract infection (UTI). How is this diagnosed? This condition is diagnosed based on your medical history, a physical exam, and your symptoms. Tests will also be done, such as:  A post-void bladder scan. This measures any amount of urine that may remain in your bladder after you finish urinating.  A digital rectal exam. In a rectal exam, your health care provider  checks your prostate by putting a lubricated, gloved finger into your rectum to feel the back of your prostate gland. This exam detects the size of your gland and any abnormal lumps or growths.  An exam of your urine (urinalysis).  A prostate specific antigen (PSA) screening. This is a blood test used to screen for prostate cancer.  An ultrasound. This test uses sound waves to electronically produce a picture of your prostate gland. Your health care provider may refer you to a specialist in kidney and prostate diseases (urologist). How is this treated? Once symptoms begin, your health care provider will monitor your condition (active surveillance or watchful waiting). Treatment for this condition will depend on the severity of your condition. Treatment may include:  Observation and yearly exams. This may be the only treatment needed if your condition and symptoms are mild.  Medicines to relieve your symptoms, including: ? Medicines to shrink the prostate. ? Medicines to relax the muscle of the prostate.  Surgery in severe cases. Surgery may include: ? Prostatectomy. In this procedure, the prostate tissue is removed completely through an open incision or with a laparoscope or robotics. ? Transurethral resection of the prostate (TURP). In this procedure, a tool is inserted through the opening at the tip of the penis (urethra). It is used to cut away tissue of the inner core of the prostate. The pieces are removed through the same opening of the penis. This removes the blockage. ? Transurethral incision (TUIP). In this procedure, small cuts are made in the prostate. This lessens   the prostate's pressure on the urethra. ? Transurethral microwave thermotherapy (TUMT). This procedure uses microwaves to create heat. The heat destroys and removes a small amount of prostate tissue. ? Transurethral needle ablation (TUNA). This procedure uses radio frequencies to destroy and remove a small amount of  prostate tissue. ? Interstitial laser coagulation ([place]). This procedure uses a laser to destroy and remove a small amount of prostate tissue. ? Transurethral electrovaporization (TUVP). This procedure uses electrodes to destroy and remove a small amount of prostate tissue. ? Prostatic urethral lift. This procedure inserts an implant to push the lobes of the prostate away from the urethra. Follow these instructions at home:  Take over-the-counter and prescription medicines only as told by your health care provider.  Monitor your symptoms for any changes. Contact your health care provider with any changes.  Avoid drinking large amounts of liquid before going to bed or out in public.  Avoid or reduce how much caffeine or alcohol you drink.  Give yourself time when you urinate.  Keep all follow-up visits as told by your health care provider. This is important. Contact a health care provider if:  You have unexplained back pain.  Your symptoms do not get better with treatment.  You develop side effects from the medicine you are taking.  Your urine becomes very dark or has a bad smell.  Your lower abdomen becomes distended and you have trouble passing your urine. Get help right away if:  You have a fever or chills.  You suddenly cannot urinate.  You feel lightheaded, or very dizzy, or you faint.  There are large amounts of blood or clots in the urine.  Your urinary problems become hard to manage.  You develop moderate to severe low back or flank pain. The flank is the side of your body between the ribs and the hip. These symptoms may represent a serious problem that is an emergency. Do not wait to see if the symptoms will go away. Get medical help right away. Call your local emergency services (911 in the [place] Do not drive yourself to the hospital. Summary  Benign prostatic hyperplasia (BPH) is an enlarged prostate that is caused by the normal aging process and not by  cancer.  An enlarged prostate can press on the urethra. This can make it hard to pass urine.  This condition is part of a normal aging process and is more likely to develop in men over the age of 11 years.  Get help right away if you suddenly cannot urinate. This information is not intended to replace advice given to you by your health care provider. Make sure you discuss any questions you have with your health care provider. Document Revised: 12/14/2019 Document Reviewed: 12/14/2019 Elsevier Patient Education  2021 [place]. Hypertension, Adult Hypertension is another name for high blood pressure. High blood pressure forces your heart to work harder to pump blood. This can cause problems over time. There are two numbers in a blood pressure reading. There is a top number (systolic) over a bottom number (diastolic). It is best to have a blood pressure that is below 120/80. Healthy choices can help lower your blood pressure, or you may need medicine to help lower it. What are the causes? The cause of this condition is not known. Some conditions may be related to high blood pressure. What increases the risk?  Smoking.  Having type 2 diabetes mellitus, high cholesterol, or both.  Not getting enough exercise or physical activity.  Being overweight.  Having too much fat, sugar, calories, or salt (sodium) in your diet.  Drinking too much alcohol.  Having long-term (chronic) kidney disease.  Having a family history of high blood pressure.  Age. Risk increases with age.  Race. You may be at higher risk if you are African American.  Gender. Men are at higher risk than women before age 62. After age 40, women are at higher risk than men.  Having obstructive sleep apnea.  Stress. What are the signs or symptoms?  High blood pressure may not cause symptoms. Very high blood pressure (hypertensive crisis) may cause: ? Headache. ? Feelings of worry or nervousness  (anxiety). ? Shortness of breath. ? Nosebleed. ? A feeling of being sick to your stomach (nausea). ? Throwing up (vomiting). ? Changes in how you see. ? Very bad chest pain. ? Seizures. How is this treated?  This condition is treated by making healthy lifestyle changes, such as: ? Eating healthy foods. ? Exercising more. ? Drinking less alcohol.  Your health care provider may prescribe medicine if lifestyle changes are not enough to get your blood pressure under control, and if: ? Your top number is above 130. ? Your bottom number is above 80.  Your personal target blood pressure may vary. Follow these instructions at home: Eating and drinking  If told, follow the DASH eating plan. To follow this plan: ? Fill one half of your plate at each meal with fruits and vegetables. ? Fill one fourth of your plate at each meal with whole grains. Whole grains include whole-wheat pasta, brown rice, and whole-grain bread. ? Eat or drink low-fat dairy products, such as skim milk or low-fat yogurt. ? Fill one fourth of your plate at each meal with low-fat (lean) proteins. Low-fat proteins include fish, chicken without skin, eggs, beans, and tofu. ? Avoid fatty meat, cured and processed meat, or chicken with skin. ? Avoid pre-made or processed food.  Eat less than 1,500 mg of salt each day.  Do not drink alcohol if: ? Your doctor tells you not to drink. ? You are pregnant, may be pregnant, or are planning to become pregnant.  If you drink alcohol: ? Limit how much you use to:  0-1 drink a day for women.  0-2 drinks a day for men. ? Be aware of how much alcohol is in your drink. In the [place] one drink equals one 12 oz bottle of beer (355 mL), one 5 oz glass of wine (148 mL), or one 1 oz glass of hard liquor (44 mL).   Lifestyle  Work with your doctor to stay at a healthy weight or to lose weight. Ask your doctor what the best weight is for you.  Get at least 30 minutes of exercise most  days of the week. This may include walking, swimming, or biking.  Get at least 30 minutes of exercise that strengthens your muscles (resistance exercise) at least 3 days a week. This may include lifting weights or doing Pilates.  Do not use any products that contain nicotine or tobacco, such as cigarettes, e-cigarettes, and chewing tobacco. If you need help quitting, ask your doctor.  Check your blood pressure at home as told by your doctor.  Keep all follow-up visits as told by your doctor. This is important.   Medicines  Take over-the-counter and prescription medicines only as told by your doctor. Follow directions carefully.  Do not skip doses of blood pressure medicine. The medicine does not work  as well if you skip doses. Skipping doses also puts you at risk for problems.  Ask your doctor about side effects or reactions to medicines that you should watch for. Contact a doctor if you:  Think you are having a reaction to the medicine you are taking.  Have headaches that keep coming back (recurring).  Feel dizzy.  Have swelling in your ankles.  Have trouble with your vision. Get help right away if you:  Get a very bad headache.  Start to feel mixed up (confused).  Feel weak or numb.  Feel faint.  Have very bad pain in your: ? Chest. ? Belly (abdomen).  Throw up more than once.  Have trouble breathing. Summary  Hypertension is another name for high blood pressure.  High blood pressure forces your heart to work harder to pump blood.  For most people, a normal blood pressure is less than 120/80.  Making healthy choices can help lower blood pressure. If your blood pressure does not get lower with healthy choices, you may need to take medicine. This information is not intended to replace advice given to you by your health care provider. Make sure you discuss any questions you have with your health care provider. Document Revised: 12/15/2017 Document Reviewed:  12/15/2017 Elsevier Patient Education  2021 [employer].

## 2020-08-06 LAB — CBC WITH DIFFERENTIAL/PLATELET
Basophils Absolute: 0 10*3/uL (ref 0.0–0.2)
Basos: 1 %
EOS (ABSOLUTE): 0 10*3/uL (ref 0.0–0.4)
Eos: 1 %
Hematocrit: 39.8 % (ref 37.5–51.0)
Hemoglobin: 13.4 g/dL (ref 13.0–17.7)
Immature Grans (Abs): 0 10*3/uL (ref 0.0–0.1)
Immature Granulocytes: 1 %
Lymphocytes Absolute: 1.3 10*3/uL (ref 0.7–3.1)
Lymphs: 22 %
MCH: 30.5 pg (ref 26.6–33.0)
MCHC: 33.7 g/dL (ref 31.5–35.7)
MCV: 91 fL (ref 79–97)
Monocytes Absolute: 0.6 10*3/uL (ref 0.1–0.9)
Monocytes: 9 %
Neutrophils Absolute: 4.1 10*3/uL (ref 1.4–7.0)
Neutrophils: 66 %
Platelets: 221 10*3/uL (ref 150–450)
RBC: 4.39 x10E6/uL (ref 4.14–5.80)
RDW: 12 % (ref 11.6–15.4)
WBC: 6.1 10*3/uL (ref 3.4–10.8)

## 2020-08-06 LAB — COMPREHENSIVE METABOLIC PANEL
ALT: 8 IU/L (ref 0–44)
AST: 18 IU/L (ref 0–40)
Albumin/Globulin Ratio: 1.1 — ABNORMAL LOW (ref 1.2–2.2)
Albumin: 4.2 g/dL (ref 3.7–4.7)
Alkaline Phosphatase: 93 IU/L (ref 44–121)
BUN/Creatinine Ratio: 10 (ref 10–24)
BUN: 16 mg/dL (ref 8–27)
Bilirubin Total: 1.2 mg/dL (ref 0.0–1.2)
CO2: 24 mmol/L (ref 20–29)
Calcium: 10.1 mg/dL (ref 8.6–10.2)
Chloride: 97 mmol/L (ref 96–106)
Creatinine, Ser: 1.56 mg/dL — ABNORMAL HIGH (ref 0.76–1.27)
Globulin, Total: 3.7 g/dL (ref 1.5–4.5)
Glucose: 90 mg/dL (ref 65–99)
Potassium: 4.7 mmol/L (ref 3.5–5.2)
Sodium: 136 mmol/L (ref 134–144)
Total Protein: 7.9 g/dL (ref 6.0–8.5)
eGFR: 45 mL/min/{1.73_m2} — ABNORMAL LOW (ref >59)

## 2020-08-06 LAB — PSA: Prostate Specific Ag, Serum: 1.5 ng/mL (ref 0.0–4.0)

## 2020-08-06 LAB — TSH: TSH: 9.71 u[IU]/mL — ABNORMAL HIGH (ref 0.450–4.500)

## 2020-08-06 NOTE — Progress Notes (Signed)
CBC ok,  CMP kidney function decreased slightly, increase hydration and avoid NSAID'S advised. PSA is within normal limits.  Please schedule patient a follow up within one month to discuss TSH of 9.710 and to determine need for medication with PCP.

## 2020-08-20 ENCOUNTER — Other Ambulatory Visit: Payer: Self-pay | Admitting: Physician Assistant

## 2020-08-20 DIAGNOSIS — F209 Schizophrenia, unspecified: Secondary | ICD-10-CM

## 2020-08-20 DIAGNOSIS — G4701 Insomnia due to medical condition: Secondary | ICD-10-CM

## 2020-11-05 ENCOUNTER — Encounter: Payer: Self-pay | Admitting: Family Medicine

## 2020-11-05 ENCOUNTER — Ambulatory Visit (INDEPENDENT_AMBULATORY_CARE_PROVIDER_SITE_OTHER): Payer: Medicare Other | Admitting: Family Medicine

## 2020-11-05 ENCOUNTER — Other Ambulatory Visit: Payer: Self-pay

## 2020-11-05 VITALS — BP 127/61 | HR 56 | Temp 97.6°F | Resp 16 | Wt 230.0 lb

## 2020-11-05 DIAGNOSIS — I10 Essential (primary) hypertension: Secondary | ICD-10-CM

## 2020-11-05 DIAGNOSIS — N182 Chronic kidney disease, stage 2 (mild): Secondary | ICD-10-CM

## 2020-11-05 DIAGNOSIS — G4701 Insomnia due to medical condition: Secondary | ICD-10-CM

## 2020-11-05 DIAGNOSIS — I739 Peripheral vascular disease, unspecified: Secondary | ICD-10-CM | POA: Diagnosis not present

## 2020-11-05 DIAGNOSIS — F209 Schizophrenia, unspecified: Secondary | ICD-10-CM

## 2020-11-05 NOTE — Assessment & Plan Note (Signed)
Chronic and well controlled Referral to psych for ongoing management

## 2020-11-05 NOTE — Assessment & Plan Note (Signed)
Discussed risk factor management. 

## 2020-11-05 NOTE — Progress Notes (Signed)
Established patient visit   Patient: Aaron Stokes   DOB: December 30, 1940   80 y.o. Male  MRN: [id number] Visit Date: 11/05/2020  Today's healthcare provider: Lavon Paganini, MD   Chief Complaint  Patient presents with   Hypertension   Subjective    Hypertension Pertinent negatives include no chest pain, headaches, neck pain, palpitations or shortness of breath.    Hypertension, follow-up  BP Readings from Last 3 Encounters:  11/05/20 127/61  08/05/20 135/71  01/05/20 (!) 115/51   Wt Readings from Last 3 Encounters:  11/05/20 230 lb (104.3 kg)  08/05/20 236 lb 6.4 oz (107.2 kg)  01/05/20 225 lb (102.1 kg)     He was last seen for hypertension 3 months ago.  BP at that visit was 135/71. Management since that visit includes none.  He reports excellent compliance with treatment. He is having side effects.  He is following a Regular diet. He is exercising. He does not smoke.  Use of agents associated with hypertension: NSAIDS.   Outside blood pressures are not being checked. Symptoms: No chest pain No chest pressure  Yes palpitations No syncope  No dyspnea No orthopnea  No paroxysmal nocturnal dyspnea No lower extremity edema   Pertinent labs: Lab Results  Component Value Date   CHOL 168 01/05/2020   HDL 61 01/05/2020   LDLCALC 97 01/05/2020   TRIG 50 01/05/2020   CHOLHDL 2.8 01/05/2020   Lab Results  Component Value Date   NA 136 08/05/2020   K 4.7 08/05/2020   CREATININE 1.56 (H) 08/05/2020   GFRNONAA 52 (L) 02/21/2020   GFRAA 60 02/21/2020   GLUCOSE 90 08/05/2020     The 10-year ASCVD risk score Mikey Bussing DC Jr., et al., 2013) is: 22.1%   Chronic Kidney disease  He has no additional kidney issues.   Schizophrenia  He continues to take 15 mg temazepam and etrafon . He doesn't see a psychiatrist and its been around 30 years since he has had an appointment.    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   Foley catheter problem ([place])    Hematuria    Acute urinary retention 11/26/2014   Hypertension 11/26/2014   Anxiety 11/26/2014   Insomnia 11/26/2014   Past Medical History:  Diagnosis Date   Acid reflux    Anxiety    Chronic kidney disease    UTI; hematuria   Hypertension    Insomnia    Mentally challenged    No Known Allergies     Medications: Outpatient Medications Prior to Visit  Medication Sig   amLODipine (NORVASC) 10 MG tablet TAKE 1 TABLET(10 MG) BY MOUTH DAILY   ASPIRIN LOW DOSE 81 MG EC tablet TAKE 1 TABLET(81 MG) BY MOUTH DAILY   atenolol (TENORMIN) 50 MG tablet TAKE 1 TABLET(50 MG) BY MOUTH DAILY   finasteride (PROSCAR) 5 MG tablet TAKE 1 TABLET(5 MG) BY MOUTH DAILY   perphenazine-amitriptyline (ETRAFON/TRIAVIL) 4-25 MG TABS tablet TAKE 1 TABLET BY MOUTH AT BEDTIME   temazepam (RESTORIL) 15 MG capsule TAKE 1 CAPSULE(15 MG) BY MOUTH AT BEDTIME AS NEEDED FOR SLEEP   No facility-administered medications prior to visit.    Review of Systems  Constitutional:  Negative for chills, fatigue and fever.  HENT:  Negative for ear  pain, rhinorrhea, sinus pressure, sinus pain and sore throat.   Eyes:  Negative for pain and visual disturbance.  Respiratory:  Negative for cough, chest tightness, shortness of breath and wheezing.   Cardiovascular:  Negative for chest pain, palpitations and leg swelling.  Gastrointestinal:  Negative for abdominal pain, blood in stool, diarrhea, nausea and vomiting.  Genitourinary:  Negative for flank pain, frequency and urgency.  Musculoskeletal:  Negative for back pain, myalgias and neck pain.   Neurological:  Negative for dizziness, seizures, syncope, weakness, light-headedness, numbness and headaches.      Objective    BP 127/61   Pulse (!) 56   Temp 97.6 F (36.4 C) (Oral)   Resp 16   Wt 230 lb (104.3 kg)   SpO2 100%   BMI 33.00 kg/m     Physical Exam Vitals reviewed.  Constitutional:      General: He is not in acute distress.    Appearance: Normal appearance. He is not diaphoretic.  HENT:     Head: Normocephalic and atraumatic.  Eyes:     General: No scleral icterus.    Conjunctiva/sclera: Conjunctivae normal.  Cardiovascular:     Rate and Rhythm: Normal rate and regular rhythm.     Pulses: Normal pulses.     Heart sounds: Normal heart sounds. No murmur heard. Pulmonary:     Effort: Pulmonary effort is normal. No respiratory distress.     Breath sounds: Normal breath sounds. No wheezing or rhonchi.  Abdominal:     General: There is no distension.     Palpations: Abdomen is soft.     Tenderness: There is no abdominal tenderness.  Musculoskeletal:     Cervical back: Neck supple.     Right lower leg: No edema.     Left lower leg: No edema.  Lymphadenopathy:     Cervical: No cervical adenopathy.  Skin:    General: Skin is warm and dry.     Capillary Refill: Capillary refill takes less than 2 seconds.     Findings: No rash.  Neurological:     Mental Status: He is alert and oriented to person, place, and time.     Cranial Nerves: No cranial nerve deficit.  Psychiatric:        Mood and Affect: Mood normal. Affect is flat.        Behavior: Behavior is cooperative.     Comments: Avoids eye contact      No results found for any visits on 11/05/20.  Assessment & Plan     Problem List Items Addressed This Visit       Cardiovascular and Mediastinum   Hypertension - Primary    Well controlled Continue current medications Recheck metabolic panel F/u in 6 months        Relevant Orders   Renal Function Panel   PAD (peripheral artery disease)  ([place])    Discussed risk factor management         Other   Insomnia    Chronic and well controlled Referral to psych for ongoing management       Relevant Orders   Ambulatory referral to Psychiatry   Schizophrenia ([place])    Chronic and reportedly stable Patient does avoid eye contact and has a flat affect Continue current meds, but recommend seeing psych for ongoing management       Relevant Orders   Ambulatory referral to Psychiatry   Other Visit Diagnoses     Stage 2 chronic kidney disease  Relevant Orders   Renal Function Panel       Return in about 6 months (around 05/08/2021) for AWV, CPE, With new PCP.      I,Essence Turner,acting as a Education administrator for Lavon Paganini, MD.,have documented all relevant documentation on the behalf of Lavon Paganini, MD,as directed by  Lavon Paganini, MD while in the presence of Lavon Paganini, MD.  I, Lavon Paganini, MD, have reviewed all documentation for this visit. The documentation on 11/05/20 for the exam, diagnosis, procedures, and orders are all accurate and complete.   Marixa Mellott, Dionne Bucy, MD, MPH [facility]

## 2020-11-05 NOTE — Assessment & Plan Note (Signed)
Well controlled Continue current medications Recheck metabolic panel F/u in 6 months  

## 2020-11-05 NOTE — Assessment & Plan Note (Signed)
Chronic and reportedly stable Patient does avoid eye contact and has a flat affect Continue current meds, but recommend seeing psych for ongoing management

## 2020-11-06 LAB — RENAL FUNCTION PANEL
Albumin: 4.3 g/dL (ref 3.7–4.7)
BUN/Creatinine Ratio: 8 — ABNORMAL LOW (ref 10–24)
BUN: 12 mg/dL (ref 8–27)
CO2: 26 mmol/L (ref 20–29)
Calcium: 9.9 mg/dL (ref 8.6–10.2)
Chloride: 100 mmol/L (ref 96–106)
Creatinine, Ser: 1.49 mg/dL — ABNORMAL HIGH (ref 0.76–1.27)
Glucose: 92 mg/dL (ref 65–99)
Phosphorus: 2.6 mg/dL — ABNORMAL LOW (ref 2.8–4.1)
Potassium: 4.1 mmol/L (ref 3.5–5.2)
Sodium: 139 mmol/L (ref 134–144)
eGFR: 47 mL/min/{1.73_m2} — ABNORMAL LOW (ref >59)

## 2020-11-20 ENCOUNTER — Other Ambulatory Visit: Payer: Self-pay | Admitting: Family Medicine

## 2020-11-20 ENCOUNTER — Other Ambulatory Visit: Payer: Self-pay | Admitting: Physician Assistant

## 2020-11-20 DIAGNOSIS — F209 Schizophrenia, unspecified: Secondary | ICD-10-CM

## 2020-11-20 DIAGNOSIS — G4701 Insomnia due to medical condition: Secondary | ICD-10-CM

## 2020-11-20 NOTE — Telephone Encounter (Signed)
Please review. LOV 10/29/20. Ok to refill?

## 2020-11-20 NOTE — Telephone Encounter (Signed)
Pts brother called to see if provider can send refills today while he is still in town/ he is helping pt get around and picking up medication for him/ pt has no transportation/ please advise asap

## 2020-12-04 ENCOUNTER — Other Ambulatory Visit: Payer: Self-pay | Admitting: Physician Assistant

## 2020-12-04 DIAGNOSIS — I739 Peripheral vascular disease, unspecified: Secondary | ICD-10-CM

## 2020-12-20 ENCOUNTER — Other Ambulatory Visit: Payer: Self-pay | Admitting: Family Medicine

## 2020-12-20 DIAGNOSIS — G4701 Insomnia due to medical condition: Secondary | ICD-10-CM

## 2020-12-20 NOTE — Telephone Encounter (Signed)
Requested medication (s) are due for refill today - yes  Requested medication (s) are on the active medication list -yes  Future visit scheduled -yes  Last refill: 11/21/20 #30  Notes to clinic: Request RF: non delegated Rx  Requested Prescriptions  Pending Prescriptions Disp Refills   temazepam (RESTORIL) 15 MG capsule [Pharmacy Med Name: TEMAZEPAM '15MG'$  CAPSULES] 30 capsule     Sig: TAKE 1 CAPSULE(15 MG) BY MOUTH AT BEDTIME AS NEEDED FOR SLEEP     Not Delegated - Psychiatry:  Anxiolytics/Hypnotics Failed - 12/20/2020 11:15 AM      Failed - This refill cannot be delegated      Failed - Urine Drug Screen completed in last 360 days      Passed - Valid encounter within last 6 months    Recent Outpatient Visits           1 month ago Primary hypertension   [facility] [place], Dionne Bucy, MD   4 months ago Hypertension, unspecified type   [facility] [place], Kelby Aline, FNP   11 months ago Medicare annual wellness visit, subsequent   [place], [place], [place]   1 year ago PAD (peripheral artery disease) [facility])   [place], Clearnce Sorrel, [place]   2 years ago Annual physical exam   [place], Clearnce Sorrel, [place]       Future Appointments             In 4 months Rollene Rotunda, Jaci Standard, FNP [employer], [place]               Requested Prescriptions  Pending Prescriptions Disp Refills   temazepam (RESTORIL) 15 MG capsule [Pharmacy Med Name: TEMAZEPAM '15MG'$  CAPSULES] 30 capsule     Sig: TAKE 1 CAPSULE(15 MG) BY MOUTH AT BEDTIME AS NEEDED FOR SLEEP     Not Delegated - Psychiatry:  Anxiolytics/Hypnotics Failed - 12/20/2020 11:15 AM      Failed - This refill cannot be delegated      Failed - Urine Drug Screen completed in last 360 days      Passed - Valid encounter within last 6 months    Recent Outpatient Visits           1 month ago Primary hypertension    [facility] [place], Dionne Bucy, MD   4 months ago Hypertension, unspecified type   [facility] [place], Kelby Aline, FNP   11 months ago Medicare annual wellness visit, subsequent   [place], [place], [place]   1 year ago PAD (peripheral artery disease) [facility])   [place], Clearnce Sorrel, [place]   2 years ago Annual physical exam   [facility] [place], Clearnce Sorrel, [place]       Future Appointments             In 4 months Rollene Rotunda, Jaci Standard, [place], [place]

## 2021-01-20 ENCOUNTER — Other Ambulatory Visit: Payer: Self-pay | Admitting: Physician Assistant

## 2021-01-20 DIAGNOSIS — I10 Essential (primary) hypertension: Secondary | ICD-10-CM

## 2021-01-20 DIAGNOSIS — R338 Other retention of urine: Secondary | ICD-10-CM

## 2021-01-20 DIAGNOSIS — N401 Enlarged prostate with lower urinary tract symptoms: Secondary | ICD-10-CM

## 2021-01-21 MED ORDER — FINASTERIDE 5 MG PO TABS: ORAL_TABLET | ORAL | 1 refills | Status: DC

## 2021-01-21 NOTE — Telephone Encounter (Signed)
[facility] faxed refill request for the following medications:   atenolol (TENORMIN) 50 MG tablet    finasteride (PROSCAR) 5 MG tablet   Please advise.

## 2021-02-03 ENCOUNTER — Other Ambulatory Visit: Payer: Self-pay

## 2021-02-03 DIAGNOSIS — I10 Essential (primary) hypertension: Secondary | ICD-10-CM

## 2021-02-03 MED ORDER — ATENOLOL 50 MG PO TABS: ORAL_TABLET | ORAL | 1 refills | Status: DC

## 2021-02-03 NOTE — Telephone Encounter (Signed)
Received call form pt's brother in law [name]- atenolol was not there to pick up.  Called pharmacy on file and Marlon Pel D stated waiting on office to send order.  Phoned in order: atenolol 50 mg Take 1 tablet by mouth daily with 90 and 1 RF.    Last RF 07/22/20 #90  Routing to [facility].

## 2021-02-20 ENCOUNTER — Other Ambulatory Visit: Payer: Self-pay | Admitting: Family Medicine

## 2021-02-20 DIAGNOSIS — F209 Schizophrenia, unspecified: Secondary | ICD-10-CM

## 2021-02-20 DIAGNOSIS — G4701 Insomnia due to medical condition: Secondary | ICD-10-CM

## 2021-02-20 NOTE — Telephone Encounter (Signed)
Requested Prescriptions  Pending Prescriptions Disp Refills  . perphenazine-amitriptyline (ETRAFON/TRIAVIL) 4-25 MG TABS tablet [Pharmacy Med Name: PERPHENAZINE/AMITRIPTYLINE 4-25 TAB] 90 tablet 0    Sig: TAKE 1 TABLET BY MOUTH AT BEDTIME     Psychiatry:  Antidepressants - Heterocyclics (TCAs) Passed - 02/20/2021 11:18 AM      Passed - Valid encounter within last 6 months    Recent Outpatient Visits          3 months ago Primary hypertension   [facility] [place], Dionne Bucy, MD   6 months ago Hypertension, unspecified type   [facility] [place], Kelby Aline, FNP   1 year ago Medicare annual wellness visit, subsequent   [place], [place], PA-C   1 year ago PAD (peripheral artery disease) [facility])   [place], Clearnce Sorrel, [place]   2 years ago Annual physical exam   [place], Clearnce Sorrel, [place]      Future Appointments            In 2 months Rollene Rotunda, Jaci Standard, [place], [facility]

## 2021-05-04 ENCOUNTER — Other Ambulatory Visit: Payer: Self-pay | Admitting: Family Medicine

## 2021-05-04 DIAGNOSIS — F209 Schizophrenia, unspecified: Secondary | ICD-10-CM

## 2021-05-04 DIAGNOSIS — G4701 Insomnia due to medical condition: Secondary | ICD-10-CM

## 2021-05-04 NOTE — Telephone Encounter (Signed)
Requested Prescriptions  Pending Prescriptions Disp Refills   perphenazine-amitriptyline (ETRAFON/TRIAVIL) 4-25 MG TABS tablet [Pharmacy Med Name: PERPHENAZINE/AMITRIPTYLINE 4-25 TAB] 90 tablet 0    Sig: TAKE 1 TABLET BY MOUTH AT BEDTIME     Psychiatry:  Antidepressants - Heterocyclics (TCAs) Passed - 05/04/2021  6:33 AM      Passed - Valid encounter within last 6 months    Recent Outpatient Visits          6 months ago Primary hypertension   [facility] [place], Dionne Bucy, MD   9 months ago Hypertension, unspecified type   [place], Kelby Aline, FNP   1 year ago Medicare annual wellness visit, subsequent   [place], [place], [place]   1 year ago PAD (peripheral artery disease) [facility])   [place], Clearnce Sorrel, [place]   2 years ago Annual physical exam   [facility], Clearnce Sorrel, [place]      Future Appointments            In 4 days [name], [place], [facility]

## 2021-05-08 ENCOUNTER — Encounter: Payer: Self-pay | Admitting: Family Medicine

## 2021-05-08 ENCOUNTER — Ambulatory Visit (INDEPENDENT_AMBULATORY_CARE_PROVIDER_SITE_OTHER): Payer: Medicare Other | Admitting: Family Medicine

## 2021-05-08 VITALS — BP 137/54 | HR 65 | Resp 16 | Wt 232.9 lb

## 2021-05-08 DIAGNOSIS — I10 Essential (primary) hypertension: Secondary | ICD-10-CM | POA: Diagnosis not present

## 2021-05-08 DIAGNOSIS — Z0000 Encounter for general adult medical examination without abnormal findings: Secondary | ICD-10-CM | POA: Diagnosis not present

## 2021-05-08 DIAGNOSIS — G4701 Insomnia due to medical condition: Secondary | ICD-10-CM

## 2021-05-08 DIAGNOSIS — R338 Other retention of urine: Secondary | ICD-10-CM

## 2021-05-08 DIAGNOSIS — Z13220 Encounter for screening for lipoid disorders: Secondary | ICD-10-CM | POA: Insufficient documentation

## 2021-05-08 DIAGNOSIS — N1831 Chronic kidney disease, stage 3a: Secondary | ICD-10-CM

## 2021-05-08 DIAGNOSIS — N401 Enlarged prostate with lower urinary tract symptoms: Secondary | ICD-10-CM

## 2021-05-08 DIAGNOSIS — F209 Schizophrenia, unspecified: Secondary | ICD-10-CM | POA: Diagnosis not present

## 2021-05-08 DIAGNOSIS — E039 Hypothyroidism, unspecified: Secondary | ICD-10-CM

## 2021-05-08 DIAGNOSIS — Z136 Encounter for screening for cardiovascular disorders: Secondary | ICD-10-CM

## 2021-05-08 DIAGNOSIS — Z125 Encounter for screening for malignant neoplasm of prostate: Secondary | ICD-10-CM | POA: Insufficient documentation

## 2021-05-08 DIAGNOSIS — Z7689 Persons encountering health services in other specified circumstances: Secondary | ICD-10-CM | POA: Insufficient documentation

## 2021-05-08 MED ORDER — FINASTERIDE 5 MG PO TABS: ORAL_TABLET | ORAL | 3 refills | Status: DC

## 2021-05-08 MED ORDER — ATENOLOL 50 MG PO TABS: ORAL_TABLET | ORAL | 3 refills | Status: DC

## 2021-05-08 MED ORDER — PERPHENAZINE-AMITRIPTYLINE 4-25 MG PO TABS: 1.0000 | ORAL_TABLET | Freq: Every day | ORAL | 3 refills | Status: DC

## 2021-05-08 MED ORDER — TEMAZEPAM 15 MG PO CAPS: ORAL_CAPSULE | ORAL | 3 refills | Status: DC

## 2021-05-08 MED ORDER — AMLODIPINE BESYLATE 10 MG PO TABS: 10.0000 mg | ORAL_TABLET | Freq: Every day | ORAL | 3 refills | Status: DC

## 2021-05-08 NOTE — Assessment & Plan Note (Addendum)
Chronic stable Continue medication Referral to psych if needed Contracted to safety Denies SI or HI

## 2021-05-08 NOTE — Progress Notes (Signed)
Complete physical exam   Patient: Aaron Stokes   DOB: 1940-09-05   81 y.o. Male  MRN: [id number] Visit Date: 05/08/2021  Today's healthcare provider: [name], FNP   Chief Complaint  Patient presents with   Annual Exam   Subjective     HPI  Aaron Stokes is a 81 y.o. male who presents today for a complete physical exam.  He reports consuming a general diet. Home exercise routine includes stretching. He generally feels well. He reports sleeping well. He does not have additional problems to discuss today.   Past Medical History:  Diagnosis Date   Acid reflux    Anxiety    Chronic kidney disease    UTI; hematuria   Hypertension    Insomnia    Mentally challenged    Past Surgical History:  Procedure Laterality Date   CIRCUMCISION, NON-NEWBORN     CYSTOSCOPY WITH STENT PLACEMENT Right 08/21/2015   Procedure: cystoscopy;  Surgeon: Nickie Retort, MD;  Location: [facility];  Service: Urology;  Laterality: Right;   CYSTOSCOPY WITH STENT PLACEMENT Right 12/11/2015   Procedure: CYSTOSCOPY WITH STENT PLACEMENT;  Surgeon: Nickie Retort, MD;  Location: [facility];  Service: Urology;  Laterality: Right;   HOLEP-LASER ENUCLEATION OF THE PROSTATE WITH MORCELLATION N/A 10/23/2015   Procedure: HOLEP-LASER ENUCLEATION OF THE PROSTATE WITH MORCELLATION;  Surgeon: Hollice Espy, MD;  Location: [facility];  Service: Urology;  Laterality: N/A;   TRANSURETHRAL RESECTION OF BLADDER TUMOR  12/11/2015   Procedure: TRANSURETHRAL RESECTION OF BLADDER TUMOR (TURBT);  Surgeon: Nickie Retort, MD;  Location: [facility];  Service: Urology;;   URETEROSCOPY WITH HOLMIUM LASER LITHOTRIPSY Right 12/11/2015   Procedure: URETEROSCOPY with biopsy;  Surgeon: Nickie Retort, MD;  Location: [facility];  Service: Urology;  Laterality: Right;   Social History   Socioeconomic History   Marital status: Divorced    Spouse name: Not on file   Number of children: 0   Years of education: Not on  file   Highest education level: 12th grade  Occupational History   Occupation: retired  Tobacco Use   Smoking status: Never   Smokeless tobacco: Never  Vaping Use   Vaping Use: Never used  Substance and Sexual Activity   Alcohol use: No    Alcohol/week: 0.0 standard drinks   Drug use: No   Sexual activity: Not Currently  Other Topics Concern   Not on file  Social History Narrative   Not on file   Social Determinants of Health   Financial Resource Strain: Not on file  Food Insecurity: Not on file  Transportation Needs: Not on file  Physical Activity: Not on file  Stress: Not on file  Social Connections: Not on file  Intimate Partner Violence: Not on file   Family Status  Relation Name Status   Father  Deceased   Mother  Deceased   PGM  (Not Specified)   Brother  Alive   Sister  Alive   Brother  Alive   Sister  Alive   Sister  Alive   Neg Hx  (Not Specified)   Family History  Problem Relation Age of Onset   Bladder Cancer Father    Cancer Father    Bladder Cancer Paternal Grandmother    Cancer Brother    Prostate cancer Neg Hx    No Known Allergies  Patient Care Team: Bacigalupo, Dionne Bucy, MD as PCP - General (Family Medicine)   Medications: Outpatient  Medications Prior to Visit  Medication Sig   ASPIRIN LOW DOSE 81 MG EC tablet TAKE 1 TABLET(81 MG) BY MOUTH DAILY   [DISCONTINUED] amLODipine (NORVASC) 10 MG tablet TAKE 1 TABLET(10 MG) BY MOUTH DAILY   [DISCONTINUED] atenolol (TENORMIN) 50 MG tablet TAKE 1 TABLET(50 MG) BY MOUTH DAILY   [DISCONTINUED] finasteride (PROSCAR) 5 MG tablet TAKE 1 TABLET(5 MG) BY MOUTH DAILY   [DISCONTINUED] perphenazine-amitriptyline (ETRAFON/TRIAVIL) 4-25 MG TABS tablet TAKE 1 TABLET BY MOUTH AT BEDTIME   [DISCONTINUED] temazepam (RESTORIL) 15 MG capsule TAKE 1 CAPSULE(15 MG) BY MOUTH AT BEDTIME AS NEEDED FOR SLEEP   No facility-administered medications prior to visit.    Review of Systems  All other systems reviewed and  are negative.    Objective    BP (!) 137/54 (BP Location: Right Arm, Patient Position: Sitting, Cuff Size: Large)    Pulse 65    Resp 16    Wt 232 lb 14.4 oz (105.6 kg)    SpO2 100%    BMI 33.42 kg/m    Physical Exam Vitals and nursing note reviewed.  Constitutional:      General: He is awake. He is not in acute distress.    Appearance: Normal appearance. He is well-developed and well-groomed. He is obese. He is not ill-appearing, toxic-appearing or diaphoretic.  HENT:     Head: Normocephalic and atraumatic.     Jaw: There is normal jaw occlusion. No trismus, tenderness, swelling or pain on movement.     Salivary Glands: Right salivary gland is not diffusely enlarged or tender. Left salivary gland is not diffusely enlarged or tender.     Right Ear: Hearing, tympanic membrane, ear canal and external ear normal. There is no impacted cerumen.     Left Ear: Hearing, tympanic membrane, ear canal and external ear normal. There is no impacted cerumen.     Ears:     Comments: Excessive wax present; refused in office wax removal Advised of OTC medication, Debrox to assist with removal if desired    Nose: Nose normal. No congestion or rhinorrhea.     Right Turbinates: Not enlarged, swollen or pale.     Left Turbinates: Not enlarged, swollen or pale.     Right Sinus: No maxillary sinus tenderness or frontal sinus tenderness.     Left Sinus: No maxillary sinus tenderness or frontal sinus tenderness.     Mouth/Throat:     Lips: Pink.     Mouth: Mucous membranes are moist. No injury, lacerations, oral lesions or angioedema.     Pharynx: Oropharynx is clear. Uvula midline. No pharyngeal swelling, oropharyngeal exudate or posterior oropharyngeal erythema.     Tonsils: No tonsillar exudate or tonsillar abscesses.  Eyes:     General: Lids are normal. Vision grossly intact. Gaze aligned appropriately.        Right eye: No discharge.        Left eye: No discharge.     Extraocular Movements:  Extraocular movements intact.     Conjunctiva/sclera: Conjunctivae normal.     Pupils: Pupils are equal, round, and reactive to light.  Neck:     Thyroid: No thyroid mass, thyromegaly or thyroid tenderness.     Vascular: No carotid bruit.     Trachea: Trachea normal. No tracheal tenderness.  Cardiovascular:     Rate and Rhythm: Normal rate and regular rhythm.     Pulses: Normal pulses.          Carotid pulses are 2+ on the  right side and 2+ on the left side.      Radial pulses are 2+ on the right side and 2+ on the left side.       Femoral pulses are 2+ on the right side and 2+ on the left side.      Popliteal pulses are 2+ on the right side and 2+ on the left side.       Dorsalis pedis pulses are 2+ on the right side and 2+ on the left side.       Posterior tibial pulses are 2+ on the right side and 2+ on the left side.     Heart sounds: Normal heart sounds, S1 normal and S2 normal. No murmur heard.   No friction rub. No gallop.  Pulmonary:     Effort: Pulmonary effort is normal. No respiratory distress.     Breath sounds: Normal breath sounds and air entry. No stridor. No wheezing, rhonchi or rales.  Chest:     Chest wall: No tenderness.  Abdominal:     General: Abdomen is flat. Bowel sounds are normal. There is no distension.     Palpations: Abdomen is soft. There is no mass.     Tenderness: There is no abdominal tenderness. There is no guarding or rebound.     Hernia: No hernia is present.  Genitourinary:    Comments: Exam deferred; denies complaints Musculoskeletal:        General: No swelling, tenderness, deformity or signs of injury. Normal range of motion.     Cervical back: Normal range of motion and neck supple. No rigidity or tenderness.     Right lower leg: No edema.     Left lower leg: No edema.  Lymphadenopathy:     Cervical: No cervical adenopathy.     Right cervical: No superficial, deep or posterior cervical adenopathy.    Left cervical: No superficial, deep or  posterior cervical adenopathy.  Skin:    General: Skin is warm and dry.     Capillary Refill: Capillary refill takes less than 2 seconds.     Coloration: Skin is not jaundiced or pale.     Findings: No bruising, erythema, lesion or rash.  Neurological:     General: No focal deficit present.     Mental Status: He is alert and oriented to person, place, and time. Mental status is at baseline.     GCS: GCS eye subscore is 4. GCS verbal subscore is 5. GCS motor subscore is 6.     Sensory: Sensation is intact. No sensory deficit.     Motor: Motor function is intact. No weakness.     Coordination: Coordination is intact.     Gait: Gait is intact.  Psychiatric:        Attention and Perception: Attention and perception normal.        Mood and Affect: Mood normal. Affect is flat.        Speech: Speech is delayed.        Behavior: Behavior normal. Behavior is cooperative.        Thought Content: Thought content normal.        Cognition and Memory: Cognition is impaired.        Judgment: Judgment normal.     Comments: -report of mental deficit; however, pt able to communicate appropriate with LIP and answer questions in relation to health status as well carry on conversation back and forth in relation to overall well being  Last depression screening scores PHQ 2/9 Scores 05/08/2021 08/05/2020 01/05/2020  PHQ - 2 Score 0 0 0  PHQ- 9 Score 0 0 -   Last fall risk screening Fall Risk  08/05/2020  Falls in the past year? 0  Number falls in past yr: 0  Injury with Fall? 0  Risk for fall due to : No Fall Risks  Follow up Falls evaluation completed   Last Audit-C alcohol use screening Alcohol Use Disorder Test (AUDIT) 05/08/2021  1. How often do you have a drink containing alcohol? 0  2. How many drinks containing alcohol do you have on a typical day when you are drinking? 0  3. How often do you have six or more drinks on one occasion? 0  AUDIT-C Score 0   A score of 3 or more in women, and  4 or more in men indicates increased risk for alcohol abuse, EXCEPT if all of the points are from question 1   No results found for any visits on 05/08/21.  Assessment & Plan    Routine Health Maintenance and Physical Exam  Exercise Activities and Dietary recommendations  Goals      DIET - INCREASE WATER INTAKE     Recommend increasing water intake to 6 glasses a day.         Immunization History  Administered Date(s) Administered   Fluad Quad(high Dose 65+) 01/05/2020   Influenza, High Dose Seasonal PF 02/21/2019   Influenza-Unspecified 02/04/2015, 02/19/2017, 02/18/2018   PFIZER(Purple Top)SARS-COV-2 Vaccination 07/16/2019, 08/08/2019, 03/22/2020   Pneumococcal Conjugate-13 01/05/2020   Pneumococcal Polysaccharide-23 11/10/2018   Zoster Recombinat (Shingrix) 07/21/2016    Health Maintenance  Topic Date Due   TETANUS/TDAP  Never done   Zoster Vaccines- Shingrix (2 of 2) 09/15/2016   COVID-19 Vaccine (4 - Booster for Pfizer series) 05/17/2020   INFLUENZA VACCINE  11/18/2020   Pneumonia Vaccine 71+ Years old  Completed   Hepatitis C Screening  Completed   HPV VACCINES  Aged Out    Discussed health benefits of physical activity, and encouraged him to engage in regular exercise appropriate for his age and condition.  Problem List Items Addressed This Visit       Cardiovascular and Mediastinum   Essential hypertension    Chronic, stable Denies CP Denies SOB Denies DOE No LE Edema noted on exam Continue medication Refills provided Seek emergent care if you develop CP, chest pain or chest pressure      Relevant Medications   amLODipine (NORVASC) 10 MG tablet   atenolol (TENORMIN) 50 MG tablet   Other Relevant Orders   Lipid panel     Endocrine   Hypothyroidism    TSH previously elevation Denies symptoms Repeat lab work today      Relevant Medications   atenolol (TENORMIN) 50 MG tablet   Other Relevant Orders   TSH + free T4     Genitourinary    Benign prostatic hyperplasia with urinary retention    Chronic, stable Repeat PSA Continue medications Refills provided      Relevant Medications   finasteride (PROSCAR) 5 MG tablet   Stage 3a chronic kidney disease (HCC)    Slight elevation in creatinine on previous chemistry Repeat chemistry today Plan to refer to nephro if needed Pt denies previous referral pattern      Relevant Orders   Comprehensive metabolic panel     Other   Schizophrenia ([place])    Chronic stable Continue medication Referral to psych if needed Contracted  to safety Denies SI or HI      Relevant Medications   perphenazine-amitriptyline (ETRAFON/TRIAVIL) 4-25 MG TABS tablet   Other Relevant Orders   Lipid panel   Annual physical exam - Primary    Referral to CCM to assist with visit and dental needs Things to do to keep yourself healthy  - Exercise at least 30-45 minutes a day, 3-4 days a week.  - Eat a low-fat diet with lots of fruits and vegetables, up to 7-9 servings per day.  - Seatbelts can save your life. Wear them always.  - Smoke detectors on every level of your home, check batteries every year.  - Eye Doctor - have an eye exam every 1-2 years  - Safe sex - if you may be exposed to STDs, use a condom.  - Alcohol -  If you drink, do it moderately, less than 2 drinks per day.  - [place]. Choose someone to speak for you if you are not able.  - Depression is common in our stressful world.If you're feeling down or losing interest in things you normally enjoy, please come in for a visit.  - Violence - If anyone is threatening or hurting you, please call immediately.        Insomnia due to medical condition    Chronic, stable Continue medication      Relevant Medications   perphenazine-amitriptyline (ETRAFON/TRIAVIL) 4-25 MG TABS tablet   temazepam (RESTORIL) 15 MG capsule   Encounter for support and coordination of transition of care    Use of CCM to assist with  vision and dental coordination of care      Relevant Orders   AMB Referral to [facility]   Screening PSA (prostate specific antigen)    Hx of BPH s/p procedure Denies current symptoms       Relevant Orders   PSA Total (Reflex To Free)   Encounter for lipid screening for cardiovascular disease    Previous lipid panel normal Repeat panel today      Relevant Orders   Lipid panel     Return in about 6 months (around 11/05/2021) for chonic disease management.     Vonna Kotyk, FNP, have reviewed all documentation for this visit. The documentation on 05/08/21 for the exam, diagnosis, procedures, and orders are all accurate and complete.  Patient seen and examined by Tally Joe,  FNP note scribed by [facility], [place], [place] [phone] (phone) [phone] (fax)  [place]

## 2021-05-08 NOTE — Assessment & Plan Note (Signed)
Slight elevation in creatinine on previous chemistry Repeat chemistry today Plan to refer to nephro if needed Pt denies previous referral pattern

## 2021-05-08 NOTE — Assessment & Plan Note (Signed)
Use of CCM to assist with vision and dental coordination of care

## 2021-05-08 NOTE — Assessment & Plan Note (Signed)
TSH previously elevation Denies symptoms Repeat lab work today

## 2021-05-08 NOTE — Assessment & Plan Note (Signed)
Chronic, stable ?Denies CP ?Denies SOB ?Denies DOE ?No LE Edema noted on exam ?Continue medication ?Refills provided ?Seek emergent care if you develop CP, chest pain or chest pressure ? ?

## 2021-05-08 NOTE — Assessment & Plan Note (Signed)
Hx of BPH s/p procedure Denies current symptoms

## 2021-05-08 NOTE — Assessment & Plan Note (Signed)
Chronic, stable Repeat PSA Continue medications Refills provided

## 2021-05-08 NOTE — Assessment & Plan Note (Signed)
Previous lipid panel normal Repeat panel today

## 2021-05-08 NOTE — Assessment & Plan Note (Signed)
Referral to CCM to assist with visit and dental needs Things to do to keep yourself healthy  - Exercise at least 30-45 minutes a day, 3-4 days a week.  - Eat a low-fat diet with lots of fruits and vegetables, up to 7-9 servings per day.  - Seatbelts can save your life. Wear them always.  - Smoke detectors on every level of your home, check batteries every year.  - Eye Doctor - have an eye exam every 1-2 years  - Safe sex - if you may be exposed to STDs, use a condom.  - Alcohol -  If you drink, do it moderately, less than 2 drinks per day.  - [place]. Choose someone to speak for you if you are not able.  - Depression is common in our stressful world.If you're feeling down or losing interest in things you normally enjoy, please come in for a visit.  - Violence - If anyone is threatening or hurting you, please call immediately.

## 2021-05-08 NOTE — Assessment & Plan Note (Signed)
Chronic, stable Continue medication

## 2021-05-09 ENCOUNTER — Other Ambulatory Visit: Payer: Self-pay | Admitting: Family Medicine

## 2021-05-09 DIAGNOSIS — E030 Congenital hypothyroidism with diffuse goiter: Secondary | ICD-10-CM

## 2021-05-09 DIAGNOSIS — N1831 Chronic kidney disease, stage 3a: Secondary | ICD-10-CM

## 2021-05-09 LAB — TSH+FREE T4
Free T4: 1.22 ng/dL (ref 0.82–1.77)
TSH: 11.8 u[IU]/mL — ABNORMAL HIGH (ref 0.450–4.500)

## 2021-05-09 LAB — LIPID PANEL
Chol/HDL Ratio: 2.9 ratio (ref 0.0–5.0)
Cholesterol, Total: 163 mg/dL (ref 100–199)
HDL: 56 mg/dL (ref >39)
LDL Chol Calc (NIH): 95 mg/dL (ref 0–99)
Triglycerides: 60 mg/dL (ref 0–149)
VLDL Cholesterol Cal: 12 mg/dL (ref 5–40)

## 2021-05-09 LAB — COMPREHENSIVE METABOLIC PANEL
ALT: 7 IU/L (ref 0–44)
AST: 18 IU/L (ref 0–40)
Albumin/Globulin Ratio: 1.5 (ref 1.2–2.2)
Albumin: 4.4 g/dL (ref 3.7–4.7)
Alkaline Phosphatase: 93 IU/L (ref 44–121)
BUN/Creatinine Ratio: 10 (ref 10–24)
BUN: 15 mg/dL (ref 8–27)
Bilirubin Total: 0.9 mg/dL (ref 0.0–1.2)
CO2: 25 mmol/L (ref 20–29)
Calcium: 10.3 mg/dL — ABNORMAL HIGH (ref 8.6–10.2)
Chloride: 101 mmol/L (ref 96–106)
Creatinine, Ser: 1.45 mg/dL — ABNORMAL HIGH (ref 0.76–1.27)
Globulin, Total: 3 g/dL (ref 1.5–4.5)
Glucose: 105 mg/dL — ABNORMAL HIGH (ref 70–99)
Potassium: 4.5 mmol/L (ref 3.5–5.2)
Sodium: 140 mmol/L (ref 134–144)
Total Protein: 7.4 g/dL (ref 6.0–8.5)
eGFR: 49 mL/min/{1.73_m2} — ABNORMAL LOW (ref >59)

## 2021-05-09 LAB — PSA TOTAL (REFLEX TO FREE): Prostate Specific Ag, Serum: 1.4 ng/mL (ref 0.0–4.0)

## 2021-05-09 MED ORDER — LEVOTHYROXINE SODIUM 100 MCG PO TABS: 100.0000 ug | ORAL_TABLET | Freq: Every day | ORAL | 0 refills | Status: DC

## 2021-05-12 ENCOUNTER — Telehealth: Payer: Self-pay

## 2021-05-12 NOTE — Telephone Encounter (Signed)
° °  Telephone encounter was:  Successful.  05/12/2021 Name: Aaron Stokes MRN: [id number] DOB: May 17, 1940  [name] is a 81 y.o. year old male who is a primary care patient of Bacigalupo, Dionne Bucy, MD . The community resource team was consulted for assistance with  dentist, dentures and vision provider  Care guide performed the following interventions: Patient provided with information about care guide support team and interviewed to confirm resource needs. Patient stated he does need denture /dentist resouces as well as vision provider. He asked that I mail the information to him.  Follow Up Plan:  Care guide will follow up with patient by phone over the next few days     [facility]  [phone], [place], [place] [address] Phone: [phone] Email: [email or web address]

## 2021-05-16 ENCOUNTER — Ambulatory Visit: Admission: RE | Admit: 2021-05-16 | Payer: Medicare Other | Source: Ambulatory Visit

## 2021-06-03 DIAGNOSIS — N1831 Chronic kidney disease, stage 3a: Secondary | ICD-10-CM | POA: Insufficient documentation

## 2021-06-04 ENCOUNTER — Other Ambulatory Visit: Payer: Self-pay | Admitting: Nephrology

## 2021-06-04 DIAGNOSIS — N1831 Chronic kidney disease, stage 3a: Secondary | ICD-10-CM

## 2021-06-20 ENCOUNTER — Ambulatory Visit
Admission: RE | Admit: 2021-06-20 | Discharge: 2021-06-20 | Disposition: A | Discharge status: Home / Self Care | Payer: Medicare Other | Source: Ambulatory Visit | Attending: Nephrology | Admitting: Nephrology

## 2021-06-20 ENCOUNTER — Other Ambulatory Visit: Payer: Self-pay

## 2021-06-20 DIAGNOSIS — N1831 Chronic kidney disease, stage 3a: Secondary | ICD-10-CM | POA: Diagnosis present

## 2021-06-24 ENCOUNTER — Telehealth: Payer: Self-pay | Admitting: Family Medicine

## 2021-06-24 NOTE — Telephone Encounter (Signed)
Copied from [place] [id number]. Topic: Medicare AWV ?>> Jun 24, 2021  1:55 PM Cher Nakai R wrote: ?Reason for CRM:  ?Left message for patient to call back and schedule Medicare Annual Wellness Visit (AWV) in office.  ? ?If not able to come in office, please offer to do virtually or by telephone.  ? ?Last AWV:  01/05/2020 ? ?Please schedule at anytime with [facility] Health Advisor. ? ?If any questions, please contact me at [phone] ?

## 2021-06-27 ENCOUNTER — Telehealth: Payer: Self-pay | Admitting: Family Medicine

## 2021-06-27 NOTE — Telephone Encounter (Signed)
Pts brother in law Marlana Latus is calling for the patients URINARY TRACT ULTRASOUND COMPLETE ?Please advise CB- [phone] ?

## 2021-06-30 NOTE — Telephone Encounter (Signed)
They should call urology about the imaging that they ordered

## 2021-06-30 NOTE — Telephone Encounter (Signed)
Left detailed message for Mr. Jenny Reichmann to call nephrology for results.  ?

## 2021-06-30 NOTE — Telephone Encounter (Signed)
Please advise. It looks like imaging was ordered by nephrology.  ?

## 2021-08-04 ENCOUNTER — Other Ambulatory Visit: Payer: Self-pay | Admitting: Family Medicine

## 2021-08-04 DIAGNOSIS — E030 Congenital hypothyroidism with diffuse goiter: Secondary | ICD-10-CM

## 2021-08-11 ENCOUNTER — Ambulatory Visit: Payer: Medicare Other | Admitting: Urology

## 2021-08-13 ENCOUNTER — Encounter: Payer: Self-pay | Admitting: Urology

## 2021-08-13 ENCOUNTER — Ambulatory Visit (INDEPENDENT_AMBULATORY_CARE_PROVIDER_SITE_OTHER): Payer: Medicare Other | Admitting: Urology

## 2021-08-13 VITALS — BP 137/68 | HR 61 | Ht 70.0 in | Wt 228.9 lb

## 2021-08-13 DIAGNOSIS — Z87448 Personal history of other diseases of urinary system: Secondary | ICD-10-CM | POA: Diagnosis not present

## 2021-08-13 DIAGNOSIS — N138 Other obstructive and reflux uropathy: Secondary | ICD-10-CM

## 2021-08-13 DIAGNOSIS — N281 Cyst of kidney, acquired: Secondary | ICD-10-CM | POA: Diagnosis not present

## 2021-08-13 NOTE — Progress Notes (Signed)
? ?08/13/21 ?12:08 PM  ? ?[place] ?24-Oct-1940 ?[id number] ? ?CC: Left renal cyst, CKD, history of BPH HOLEP ? ?HPI: ?I saw Mr. Aaron Stokes today for the above issues.  He is actually been followed extensively in our clinic previously, but has not been seen since September 2019.  It sounds like he originally presented with Foley dependent urinary retention and and a very large prostate and underwent a HOLEP with Dr. Erlene Quan in 2017, and has been voiding well since that time.  At some point he was restarted on finasteride.  He denies any urinary complaints today.  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  He denies any gross hematuria or flank pain. ? ?He underwent a renal ultrasound for CKD which showed a 2 cm minimally complex left renal cyst and was referred to urology. ? ? ?PMH: ?Past Medical History:  ?Diagnosis Date  ? Acid reflux   ? Anxiety   ? Chronic kidney disease   ? UTI; hematuria  ? Hypertension   ? Insomnia   ? Mentally challenged   ? ? ?Surgical History: ?Past Surgical History:  ?Procedure Laterality Date  ? CIRCUMCISION, NON-NEWBORN    ? CYSTOSCOPY WITH STENT PLACEMENT Right 08/21/2015  ? Procedure: cystoscopy;  Surgeon: Nickie Retort, MD;  Location: [facility];  Service: Urology;  Laterality: Right;  ? CYSTOSCOPY WITH STENT PLACEMENT Right 12/11/2015  ? Procedure: CYSTOSCOPY WITH STENT PLACEMENT;  Surgeon: Nickie Retort, MD;  Location: [facility];  Service: Urology;  Laterality: Right;  ? HOLEP-LASER ENUCLEATION OF THE PROSTATE WITH MORCELLATION N/A 10/23/2015  ? Procedure: HOLEP-LASER ENUCLEATION OF THE PROSTATE WITH MORCELLATION;  Surgeon: Hollice Espy, MD;  Location: [facility];  Service: Urology;  Laterality: N/A;  ? TRANSURETHRAL RESECTION OF BLADDER TUMOR  12/11/2015  ? Procedure:  TRANSURETHRAL RESECTION OF BLADDER TUMOR (TURBT);  Surgeon: Nickie Retort, MD;  Location: [facility];  Service: Urology;;  ? URETEROSCOPY WITH HOLMIUM LASER LITHOTRIPSY Right 12/11/2015  ? Procedure: URETEROSCOPY with biopsy;  Surgeon: Nickie Retort, MD;  Location: [facility];  Service: Urology;  Laterality: Right;  ? ? ? ? ?Family History: ?Family History  ?Problem Relation Age of Onset  ? Bladder Cancer Father   ? Cancer Father   ? Bladder Cancer Paternal Grandmother   ? Cancer Brother   ? Prostate cancer Neg Hx   ? ? ?Social History:  reports that he has never smoked. He has never used smokeless tobacco. He reports that he does not drink alcohol and does not use drugs. ? ?Physical Exam: ?BP 137/68 (BP Location: Left Arm, Patient Position: Sitting, Cuff Size: Large)   Pulse 61   Ht '5\' 10"'$  (1.778 m)   Wt 228 lb 14.4 oz (103.8 kg)   BMI 32.84 kg/m?   ? ?Constitutional:  Alert and oriented, No acute distress. ?Cardiovascular: No clubbing, cyanosis, or edema. ?Respiratory: Normal respiratory effort, no increased work of breathing. ?GI: Abdomen is soft, nontender, nondistended, no abdominal masses ? ? ?Laboratory Data: ?Reviewed ? ?Pertinent Imaging: ?I have personally viewed and interpreted the renal ultrasound showing a minimally complex 2 cm left renal cyst..  On prior CT from 2017 there does appear to be some cystic changes that correlate with this finding on the renal ultrasound. ? ?Assessment & Plan:   ?81 year old male with  history of BPH and Foley dependent urinary retention who underwent a HOLEP with Dr. Erlene Quan in 2017 has been voiding well since that time.  Recent renal ultrasound performed for CKD showed a minimally complex 2 cm left renal cyst, and on my review this appears relatively simple and fluid-filled.  This does appear to correlate with some cystic change on the CT from 2017.  We discussed this is most likely a benign lesion.  We discussed options moving forward including MRI, CT abdomen  with contrast, or repeat ultrasound in 9 months.  He opted for a repeat ultrasound in 9 months which I think is very reasonable with his age, comorbidities, and low suspicion for malignancy. ? ?RTC 9 months with renal ultrasound prior, likely can follow-up as needed at that time if left renal cyst stable ? ?Nickolas Madrid, MD ?08/13/2021 ? ?[facility] ?[address] ?[place], [place] [address] ?[phone] ? ? ?

## 2021-08-18 ENCOUNTER — Telehealth: Payer: Self-pay | Admitting: Family Medicine

## 2021-08-18 NOTE — Telephone Encounter (Signed)
Copied from [place]. Topic: Medicare AWV ?>> Aug 18, 2021  9:53 AM Cher Nakai R wrote: ?Reason for CRM:  ?Left message for patient to call back and schedule Medicare Annual Wellness Visit (AWV) in office.  ? ?If unable to come into the office for AWV,  please offer to do virtually or by telephone. ? ?Last AWV:  01/05/2020 ? ?Please schedule at anytime with [facility] Health Advisor. ? ?30 minute appointment for Virtual or phone ?45 minute appointment for in office or Initial virtual/phone ? ?Any questions, please contact me at [phone] ?

## 2021-10-08 ENCOUNTER — Telehealth: Payer: Self-pay | Admitting: Family Medicine

## 2021-10-08 NOTE — Telephone Encounter (Signed)
Copied from [place] [id number]. Topic: Medicare AWV >> Oct 08, 2021 11:00 AM Aaron Stokes wrote: Reason for CRM:  Left message for patient to call back and schedule Medicare Annual Wellness Visit (AWV) in office.   If unable to come into the office for AWV,  please offer to do virtually or by telephone.  Last AWV: 01/05/2020  Please schedule at anytime with [facility] Health Advisor.  30 minute appointment for Virtual or phone 45 minute appointment for in office or Initial virtual/phone  Any questions, please contact me at [phone]

## 2021-10-29 ENCOUNTER — Other Ambulatory Visit: Payer: Self-pay | Admitting: Family Medicine

## 2021-10-29 DIAGNOSIS — E030 Congenital hypothyroidism with diffuse goiter: Secondary | ICD-10-CM

## 2021-10-29 NOTE — Telephone Encounter (Signed)
Requested Prescriptions  Pending Prescriptions Disp Refills  . levothyroxine (SYNTHROID) 100 MCG tablet [Pharmacy Med Name: LEVOTHYROXINE 0.'100MG'$  (100MCG) TAB] 90 tablet 0    Sig: TAKE 1 TABLET(100 MCG) BY MOUTH DAILY     Endocrinology:  Hypothyroid Agents Failed - 10/29/2021  6:38 AM      Failed - TSH in normal range and within 360 days    TSH  Date Value Ref Range Status  05/08/2021 11.800 (H) 0.450 - 4.500 uIU/mL Final         Passed - Valid encounter within last 12 months    Recent Outpatient Visits          5 months ago Annual physical exam   [facility] [name], FNP   11 months ago Primary hypertension   [facility] Thor, Dionne Bucy, MD   1 year ago Hypertension, unspecified type   [facility] Flinchum, Kelby Aline, FNP   1 year ago Medicare annual wellness visit, subsequent   [place], [place], [place]   2 years ago PAD (peripheral artery disease) [facility])   [place], Clearnce Sorrel, [place]      Future Appointments            In 3 weeks [name], [place], [facility]

## 2021-11-05 ENCOUNTER — Ambulatory Visit: Payer: Medicare Other | Admitting: Family Medicine

## 2021-11-06 ENCOUNTER — Ambulatory Visit (INDEPENDENT_AMBULATORY_CARE_PROVIDER_SITE_OTHER): Payer: Medicare Other

## 2021-11-06 VITALS — Wt 228.0 lb

## 2021-11-06 DIAGNOSIS — Z0000 Encounter for general adult medical examination without abnormal findings: Secondary | ICD-10-CM

## 2021-11-06 NOTE — Progress Notes (Signed)
Virtual Visit via Telephone Note  I connected with  Aaron Stokes on 11/06/21 at 10:30 AM EDT by telephone and verified that I am speaking with the correct person using two identifiers.  Location: Patient: home Provider: [facility] Persons participating in the virtual visit: [place]   I discussed the limitations, risks, security and privacy concerns of performing an evaluation and management service by telephone and the availability of in person appointments. The patient expressed understanding and agreed to proceed.  Interactive audio and video telecommunications were attempted between this nurse and patient, however failed, due to patient having technical difficulties OR patient did not have access to video capability.  We continued and completed visit with audio only.  Some vital signs may be absent or patient reported.   Dionisio David, LPN  Subjective:   Aaron Stokes is a 81 y.o. male who presents for Medicare Annual/Subsequent preventive examination.  Review of Systems           Objective:    There were no vitals filed for this visit. There is no height or weight on file to calculate BMI.     09/06/2017   10:43 AM 06/22/2017    3:03 PM 06/18/2017    2:28 PM 10/23/2015    3:38 PM 10/09/2015   12:18 PM 08/07/2015   12:25 PM 05/15/2015    8:05 AM  Advanced Directives  Does Patient Have a Medical Advance Directive? No No No No No No No  Would patient like information on creating a medical advance directive? No - Patient declined No - Patient declined  No - patient declined information No - patient declined information No - patient declined information     Current Medications (verified) Outpatient Encounter Medications as of 11/06/2021  Medication Sig   amLODipine (NORVASC) 10 MG tablet Take 1 tablet (10 mg total) by mouth daily.   ASPIRIN LOW DOSE 81 MG EC tablet TAKE 1 TABLET(81 MG) BY MOUTH DAILY   atenolol (TENORMIN) 50 MG tablet TAKE 1 TABLET(50  MG) BY MOUTH DAILY   finasteride (PROSCAR) 5 MG tablet TAKE 1 TABLET(5 MG) BY MOUTH DAILY   levothyroxine (SYNTHROID) 100 MCG tablet TAKE 1 TABLET(100 MCG) BY MOUTH DAILY   perphenazine-amitriptyline (ETRAFON/TRIAVIL) 4-25 MG TABS tablet Take 1 tablet by mouth at bedtime.   temazepam (RESTORIL) 15 MG capsule TAKE 1 CAPSULE(15 MG) BY MOUTH AT BEDTIME AS NEEDED FOR SLEEP   No facility-administered encounter medications on file as of 11/06/2021.    Allergies (verified) Patient has no known allergies.   History: Past Medical History:  Diagnosis Date   Acid reflux    Anxiety    Chronic kidney disease    UTI; hematuria   Hypertension    Insomnia    Mentally challenged    Past Surgical History:  Procedure Laterality Date   CIRCUMCISION, NON-NEWBORN     CYSTOSCOPY WITH STENT PLACEMENT Right 08/21/2015   Procedure: cystoscopy;  Surgeon: Nickie Retort, MD;  Location: [facility] ORS;  Service: Urology;  Laterality: Right;   CYSTOSCOPY WITH STENT PLACEMENT Right 12/11/2015   Procedure: CYSTOSCOPY WITH STENT PLACEMENT;  Surgeon: Nickie Retort, MD;  Location: [facility];  Service: Urology;  Laterality: Right;   HOLEP-LASER ENUCLEATION OF THE PROSTATE WITH MORCELLATION N/A 10/23/2015   Procedure: HOLEP-LASER ENUCLEATION OF THE PROSTATE WITH MORCELLATION;  Surgeon: Hollice Espy, MD;  Location: [facility];  Service: Urology;  Laterality: N/A;   TRANSURETHRAL RESECTION OF BLADDER TUMOR  12/11/2015   Procedure:  TRANSURETHRAL RESECTION OF BLADDER TUMOR (TURBT);  Surgeon: Nickie Retort, MD;  Location: [facility];  Service: Urology;;   URETEROSCOPY WITH HOLMIUM LASER LITHOTRIPSY Right 12/11/2015   Procedure: URETEROSCOPY with biopsy;  Surgeon: Nickie Retort, MD;  Location: [facility];  Service: Urology;  Laterality: Right;   Family History  Problem Relation Age of Onset   Bladder Cancer Father    Cancer Father    Bladder Cancer Paternal Grandmother    Cancer Brother    Prostate cancer Neg Hx     Social History   Socioeconomic History   Marital status: Divorced    Spouse name: Not on file   Number of children: 0   Years of education: Not on file   Highest education level: 12th grade  Occupational History   Occupation: retired  Tobacco Use   Smoking status: Never   Smokeless tobacco: Never  Vaping Use   Vaping Use: Never used  Substance and Sexual Activity   Alcohol use: No    Alcohol/week: 0.0 standard drinks of alcohol   Drug use: No   Sexual activity: Not Currently  Other Topics Concern   Not on file  Social History Narrative   Not on file   Social Determinants of Health   Financial Resource Strain: Low Risk  (09/06/2017)   Overall Financial Resource Strain (CARDIA)    Difficulty of Paying Living Expenses: Not hard at all  Food Insecurity: No Food Insecurity (09/06/2017)   Hunger Vital Sign    Worried About Running Out of Food in the Last Year: Never true    [place] in the Last Year: Never true  Transportation Needs: No Transportation Needs (09/06/2017)   PRAPARE - Hydrologist (Medical): No    Lack of Transportation (Non-Medical): No  Physical Activity: Insufficiently Active (11/06/2021)   Exercise Vital Sign    Days of Exercise per Week: 1 day    Minutes of Exercise per Session: 30 min  Stress: No Stress Concern Present (11/06/2021)   [place]    Feeling of Stress : Not at all  Social Connections: Not on file    Tobacco Counseling Counseling given: Not Answered   Clinical Intake:  Pre-visit preparation completed: Yes  Pain : No/denies pain     Nutritional Risks: None Diabetes: No  How often do you need to have someone help you when you read instructions, pamphlets, or other written materials from your doctor or pharmacy?: 1 - Never  Diabetic?no  Interpreter Needed?: No  Information entered by :: Kirke Shaggy, LPN   Activities of  Daily Living    05/08/2021   10:57 AM  In your present state of health, do you have any difficulty performing the following activities:  Hearing? 0  Vision? 0  Difficulty concentrating or making decisions? 0  Walking or climbing stairs? 0  Dressing or bathing? 0  Doing errands, shopping? 0    Patient Care Team: Virginia Crews, MD as PCP - General (Family Medicine)  Indicate any recent Medical Services you may have received from other than [facility] providers in the past year (date may be approximate).     Assessment:   This is a routine wellness examination for Aaron Stokes.  Hearing/Vision screen No results found.  Dietary issues and exercise activities discussed:     Goals Addressed             This Visit's Progress  DIET - EAT MORE FRUITS AND VEGETABLES         Depression Screen    11/06/2021   10:29 AM 05/08/2021   10:57 AM 08/05/2020   11:08 AM 01/05/2020    3:21 PM 11/10/2018    2:48 PM 09/06/2017   10:43 AM 07/06/2017   10:07 AM  PHQ 2/9 Scores  PHQ - 2 Score 0 0 0 0 0 0 0  PHQ- 9 Score 0 0 0    0    Fall Risk    08/05/2020   11:07 AM 06/12/2019    2:22 PM 09/06/2017   10:43 AM 07/06/2017   10:07 AM  Fall Risk   Falls in the past year? 0 0 No No  Number falls in past yr: 0 0    Injury with Fall? 0 0    Risk for fall due to : No Fall Risks     Follow up Falls evaluation completed Falls evaluation completed      [place]:  Any stairs in or around the home? No  If so, are there any without handrails? No  Home free of loose throw rugs in walkways, pet beds, electrical cords, etc? Yes  Adequate lighting in your home to reduce risk of falls? Yes   ASSISTIVE DEVICES UTILIZED TO PREVENT FALLS:  Life alert? No  Use of a cane, walker or w/c? No  Grab bars in the bathroom? No  Shower chair or bench in shower? No  Elevated toilet seat or a handicapped toilet? No   Cognitive Function:declined         Immunizations Immunization History  Administered Date(s) Administered   Fluad Quad(high Dose 65+) 01/05/2020   Influenza, High Dose Seasonal PF 02/21/2019   Influenza-Unspecified 02/04/2015, 02/19/2017, 02/18/2018   PFIZER(Purple Top)SARS-COV-2 Vaccination 07/16/2019, 08/08/2019, 03/22/2020   Pneumococcal Conjugate-13 01/05/2020   Pneumococcal Polysaccharide-23 11/10/2018   Zoster Recombinat (Shingrix) 07/21/2016    TDAP status: Due, Education has been provided regarding the importance of this vaccine. Advised may receive this vaccine at local pharmacy or Health Dept. Aware to provide a copy of the vaccination record if obtained from local pharmacy or Health Dept. Verbalized acceptance and understanding.  Flu Vaccine status: Declined, Education has been provided regarding the importance of this vaccine but patient still declined. Advised may receive this vaccine at local pharmacy or Health Dept. Aware to provide a copy of the vaccination record if obtained from local pharmacy or Health Dept. Verbalized acceptance and understanding.  Pneumococcal vaccine status: Up to date  Covid-19 vaccine status: Completed vaccines  Qualifies for Shingles Vaccine? Yes   Zostavax completed No   Shingrix Completed?: No.    Education has been provided regarding the importance of this vaccine. Patient has been advised to call insurance company to determine out of pocket expense if they have not yet received this vaccine. Advised may also receive vaccine at local pharmacy or Health Dept. Verbalized acceptance and understanding.  Screening Tests Health Maintenance  Topic Date Due   TETANUS/TDAP  Never done   Zoster Vaccines- Shingrix (2 of 2) 09/15/2016   COVID-19 Vaccine (4 - Pfizer series) 05/17/2020   INFLUENZA VACCINE  11/18/2021   Pneumonia Vaccine 58+ Years old  Completed   HPV VACCINES  Aged Out    Health Maintenance  Health Maintenance Due  Topic Date Due   TETANUS/TDAP  Never done    Zoster Vaccines- Shingrix (2 of 2) 09/15/2016   COVID-19 Vaccine (4 -  Pfizer series) 05/17/2020    Colorectal cancer screening: No longer required.   Lung Cancer Screening: (Low Dose CT Chest recommended if Age 34-80 years, 30 pack-year currently smoking OR have quit w/in 15years.) does not qualify.    Additional Screening:  Hepatitis C Screening: does not qualify; Completed no  Vision Screening: Recommended annual ophthalmology exams for early detection of glaucoma and other disorders of the eye. Is the patient up to date with their annual eye exam?  Yes  Who is the provider or what is the name of the office in which the patient attends annual eye exams? [facility] If pt is not established with a provider, would they like to be referred to a provider to establish care? No .   Dental Screening: Recommended annual dental exams for proper oral hygiene  Community Resource Referral / Chronic Care Management: CRR required this visit?  No   CCM required this visit?  No      Plan:     I have personally reviewed and noted the following in the patient's chart:   Medical and social history Use of alcohol, tobacco or illicit drugs  Current medications and supplements including opioid prescriptions. Patient is not currently taking opioid prescriptions. Functional ability and status Nutritional status Physical activity Advanced directives List of other physicians Hospitalizations, surgeries, and ER visits in previous 12 months Vitals Screenings to include cognitive, depression, and falls Referrals and appointments  In addition, I have reviewed and discussed with patient certain preventive protocols, quality metrics, and best practice recommendations. A written personalized care plan for preventive services as well as general preventive health recommendations were provided to patient.     Dionisio David, LPN   [id number]   Nurse Notes: none

## 2021-11-06 NOTE — Patient Instructions (Signed)
Mr. Authement , Thank you for taking time to come for your Medicare Wellness Visit. I appreciate your ongoing commitment to your health goals. Please review the following plan we discussed and let me know if I can assist you in the future.   Screening recommendations/referrals: Colonoscopy: aged out Recommended yearly ophthalmology/optometry visit for glaucoma screening and checkup Recommended yearly dental visit for hygiene and checkup  Vaccinations: Influenza vaccine: n/d Pneumococcal vaccine: 01/05/20 Tdap vaccine: n/d Shingles vaccine: Shigrix 07/21/16, needs 2nd shot   Covid-19: 07/16/19, 08/08/19, 03/22/20  Advanced directives: no  Conditions/risks identified: none  Next appointment: Follow up in one year for your annual wellness visit. 11/10/22 @ 9 am by phone  Preventive Care 65 Years and Older, Male Preventive care refers to lifestyle choices and visits with your health care provider that can promote health and wellness. What does preventive care include? A yearly physical exam. This is also called an annual well check. Dental exams once or twice a year. Routine eye exams. Ask your health care provider how often you should have your eyes checked. Personal lifestyle choices, including: Daily care of your teeth and gums. Regular physical activity. Eating a healthy diet. Avoiding tobacco and drug use. Limiting alcohol use. Practicing safe sex. Taking low doses of aspirin every day. Taking vitamin and mineral supplements as recommended by your health care provider. What happens during an annual well check? The services and screenings done by your health care provider during your annual well check will depend on your age, overall health, lifestyle risk factors, and family history of disease. Counseling  Your health care provider may ask you questions about your: Alcohol use. Tobacco use. Drug use. Emotional well-being. Home and relationship well-being. Sexual activity. Eating  habits. History of falls. Memory and ability to understand (cognition). Work and work Statistician. Screening  You may have the following tests or measurements: Height, weight, and BMI. Blood pressure. Lipid and cholesterol levels. These may be checked every 5 years, or more frequently if you are over 70 years old. Skin check. Lung cancer screening. You may have this screening every year starting at age 80 if you have a 30-pack-year history of smoking and currently smoke or have quit within the past 15 years. Fecal occult blood test (FOBT) of the stool. You may have this test every year starting at age 80. Flexible sigmoidoscopy or colonoscopy. You may have a sigmoidoscopy every 5 years or a colonoscopy every 10 years starting at age 80. Prostate cancer screening. Recommendations will vary depending on your family history and other risks. Hepatitis C blood test. Hepatitis B blood test. Sexually transmitted disease (STD) testing. Diabetes screening. This is done by checking your blood sugar (glucose) after you have not eaten for a while (fasting). You may have this done every 1-3 years. Abdominal aortic aneurysm (AAA) screening. You may need this if you are a current or former smoker. Osteoporosis. You may be screened starting at age 80 if you are at high risk. Talk with your health care provider about your test results, treatment options, and if necessary, the need for more tests. Vaccines  Your health care provider may recommend certain vaccines, such as: Influenza vaccine. This is recommended every year. Tetanus, diphtheria, and acellular pertussis (Tdap, Td) vaccine. You may need a Td booster every 10 years. Zoster vaccine. You may need this after age 19. Pneumococcal 13-valent conjugate (PCV13) vaccine. One dose is recommended after age 80. Pneumococcal polysaccharide (PPSV23) vaccine. One dose is recommended after age 80.  Talk to your health care provider about which screenings and  vaccines you need and how often you need them. This information is not intended to replace advice given to you by your health care provider. Make sure you discuss any questions you have with your health care provider. Document Released: 05/03/2015 Document Revised: 12/25/2015 Document Reviewed: 02/05/2015 Elsevier Interactive Patient Education  2017 [place] Prevention in the Home Falls can cause injuries. They can happen to people of all ages. There are many things you can do to make your home safe and to help prevent falls. What can I do on the outside of my home? Regularly fix the edges of walkways and driveways and fix any cracks. Remove anything that might make you trip as you walk through a door, such as a raised step or threshold. Trim any bushes or trees on the path to your home. Use bright outdoor lighting. Clear any walking paths of anything that might make someone trip, such as rocks or tools. Regularly check to see if handrails are loose or broken. Make sure that both sides of any steps have handrails. Any raised decks and porches should have guardrails on the edges. Have any leaves, snow, or ice cleared regularly. Use sand or salt on walking paths during winter. Clean up any spills in your garage right away. This includes oil or grease spills. What can I do in the bathroom? Use night lights. Install grab bars by the toilet and in the tub and shower. Do not use towel bars as grab bars. Use non-skid mats or decals in the tub or shower. If you need to sit down in the shower, use a plastic, non-slip stool. Keep the floor dry. Clean up any water that spills on the floor as soon as it happens. Remove soap buildup in the tub or shower regularly. Attach bath mats securely with double-sided non-slip rug tape. Do not have throw rugs and other things on the floor that can make you trip. What can I do in the bedroom? Use night lights. Make sure that you have a light by your  bed that is easy to reach. Do not use any sheets or blankets that are too big for your bed. They should not hang down onto the floor. Have a firm chair that has side arms. You can use this for support while you get dressed. Do not have throw rugs and other things on the floor that can make you trip. What can I do in the kitchen? Clean up any spills right away. Avoid walking on wet floors. Keep items that you use a lot in easy-to-reach places. If you need to reach something above you, use a strong step stool that has a grab bar. Keep electrical cords out of the way. Do not use floor polish or wax that makes floors slippery. If you must use wax, use non-skid floor wax. Do not have throw rugs and other things on the floor that can make you trip. What can I do with my stairs? Do not leave any items on the stairs. Make sure that there are handrails on both sides of the stairs and use them. Fix handrails that are broken or loose. Make sure that handrails are as long as the stairways. Check any carpeting to make sure that it is firmly attached to the stairs. Fix any carpet that is loose or worn. Avoid having throw rugs at the top or bottom of the stairs. If you do have throw  rugs, attach them to the floor with carpet tape. Make sure that you have a light switch at the top of the stairs and the bottom of the stairs. If you do not have them, ask someone to add them for you. What else can I do to help prevent falls? Wear shoes that: Do not have high heels. Have rubber bottoms. Are comfortable and fit you well. Are closed at the toe. Do not wear sandals. If you use a stepladder: Make sure that it is fully opened. Do not climb a closed stepladder. Make sure that both sides of the stepladder are locked into place. Ask someone to hold it for you, if possible. Clearly mark and make sure that you can see: Any grab bars or handrails. First and last steps. Where the edge of each step is. Use tools that  help you move around (mobility aids) if they are needed. These include: Canes. Walkers. Scooters. Crutches. Turn on the lights when you go into a dark area. Replace any light bulbs as soon as they burn out. Set up your furniture so you have a clear path. Avoid moving your furniture around. If any of your floors are uneven, fix them. If there are any pets around you, be aware of where they are. Review your medicines with your doctor. Some medicines can make you feel dizzy. This can increase your chance of falling. Ask your doctor what other things that you can do to help prevent falls. This information is not intended to replace advice given to you by your health care provider. Make sure you discuss any questions you have with your health care provider. Document Released: 01/31/2009 Document Revised: 09/12/2015 Document Reviewed: 05/11/2014 Elsevier Interactive Patient Education  2017 [employer].

## 2021-11-20 ENCOUNTER — Ambulatory Visit (INDEPENDENT_AMBULATORY_CARE_PROVIDER_SITE_OTHER): Payer: Medicare Other | Admitting: Family Medicine

## 2021-11-20 ENCOUNTER — Encounter: Payer: Self-pay | Admitting: Family Medicine

## 2021-11-20 VITALS — BP 128/62 | HR 52 | Temp 97.8°F | Resp 16 | Wt 221.9 lb

## 2021-11-20 DIAGNOSIS — F209 Schizophrenia, unspecified: Secondary | ICD-10-CM | POA: Diagnosis not present

## 2021-11-20 DIAGNOSIS — I10 Essential (primary) hypertension: Secondary | ICD-10-CM | POA: Diagnosis not present

## 2021-11-20 DIAGNOSIS — E039 Hypothyroidism, unspecified: Secondary | ICD-10-CM

## 2021-11-20 DIAGNOSIS — N1831 Chronic kidney disease, stage 3a: Secondary | ICD-10-CM | POA: Diagnosis not present

## 2021-11-20 NOTE — Assessment & Plan Note (Signed)
Chronic, stable At goal Continue medication- Norvasx 10 mg, Atenolol 50 mg

## 2021-11-20 NOTE — Progress Notes (Signed)
Aaron Stokes,acting as a Education administrator for Aaron Sprout, FNP.,have documented all relevant documentation on the behalf of Aaron Sprout, FNP,as directed by  Aaron Sprout, FNP while in the presence of Aaron Sprout, FNP.  Established patient visit  Patient: Aaron Stokes   DOB: 25-Aug-1940   81 y.o. Male  MRN: [id number] Visit Date: 11/20/2021  Today's healthcare provider: [name], FNP  Introduced to nurse practitioner role and practice setting.  All questions answered.  Discussed provider/patient relationship and expectations.  Chief Complaint  Patient presents with   Hypertension   Subjective    Hypertension Pertinent negatives include no chest pain or shortness of breath.    Hypertension, follow-up  BP Readings from Last 3 Encounters:  11/20/21 128/62  08/13/21 137/68  05/08/21 (!) 137/54   Wt Readings from Last 3 Encounters:  11/20/21 221 lb 14.4 oz (100.7 kg)  11/06/21 228 lb (103.4 kg)  08/13/21 228 lb 14.4 oz (103.8 kg)     He was last seen for hypertension 6 months ago.  BP at that visit was 137/54. Management since that visit includes no changes.  He reports excellent compliance with treatment. He is not having side effects.  He is following a Regular diet. He is exercising. He does not smoke.  Use of agents associated with hypertension: none.   Outside blood pressures are not being checked. Symptoms: No chest pain No chest pressure  No palpitations No syncope  No dyspnea No orthopnea  No paroxysmal nocturnal dyspnea No lower extremity edema   Pertinent labs Lab Results  Component Value Date   CHOL 163 05/08/2021   HDL 56 05/08/2021   LDLCALC 95 05/08/2021   TRIG 60 05/08/2021   CHOLHDL 2.9 05/08/2021   Lab Results  Component Value Date   NA 140 05/08/2021   K 4.5 05/08/2021   CREATININE 1.45 (H) 05/08/2021   EGFR 49 (L) 05/08/2021   GLUCOSE 105 (H) 05/08/2021   TSH 11.800 (H) 05/08/2021     The ASCVD Risk score (Arnett DK, et  al., 2019) failed to calculate for the following reasons:   The 2019 ASCVD risk score is only valid for ages 54 to 46  --------------------------------------------------------------------------------------------------- Hypothyroid, follow-up  Lab Results  Component Value Date   TSH 11.800 (H) 05/08/2021   TSH 9.710 (H) 08/05/2020   FREET4 1.22 05/08/2021    Wt Readings from Last 3 Encounters:  11/20/21 221 lb 14.4 oz (100.7 kg)  11/06/21 228 lb (103.4 kg)  08/13/21 228 lb 14.4 oz (103.8 kg)    He was last seen for hypothyroid 6 months ago.  Management since that visit includes no changes. He reports excellent compliance with treatment. He is not having side effects.   Symptoms: No change in energy level No constipation  No diarrhea No heat / cold intolerance  No nervousness No palpitations  Yes weight changes    -----------------------------------------------------------------------------------------  Medications: Outpatient Medications Prior to Visit  Medication Sig   amLODipine (NORVASC) 10 MG tablet Take 1 tablet (10 mg total) by mouth daily.   ASPIRIN LOW DOSE 81 MG EC tablet TAKE 1 TABLET(81 MG) BY MOUTH DAILY   atenolol (TENORMIN) 50 MG tablet TAKE 1 TABLET(50 MG) BY MOUTH DAILY   finasteride (PROSCAR) 5 MG tablet TAKE 1 TABLET(5 MG) BY MOUTH DAILY   levothyroxine (SYNTHROID) 100 MCG tablet TAKE 1 TABLET(100 MCG) BY MOUTH DAILY   perphenazine-amitriptyline (ETRAFON/TRIAVIL) 4-25 MG TABS tablet Take 1 tablet by mouth  at bedtime.   temazepam (RESTORIL) 15 MG capsule TAKE 1 CAPSULE(15 MG) BY MOUTH AT BEDTIME AS NEEDED FOR SLEEP   No facility-administered medications prior to visit.    Review of Systems  Constitutional:  Negative for activity change, appetite change, fatigue and unexpected weight change.  Respiratory:  Negative for chest tightness and shortness of breath.   Cardiovascular:  Negative for chest pain and leg swelling.  Gastrointestinal:  Negative for  abdominal pain, constipation, diarrhea, nausea and vomiting.  Psychiatric/Behavioral:  Negative for sleep disturbance. The patient is not nervous/anxious.         Objective    BP 128/62 (BP Location: Right Arm, Patient Position: Sitting, Cuff Size: Large)   Pulse (!) 52   Temp 97.8 F (36.6 C) (Oral)   Resp 16   Wt 221 lb 14.4 oz (100.7 kg)   BMI 31.84 kg/m  BP Readings from Last 3 Encounters:  11/20/21 128/62  08/13/21 137/68  05/08/21 (!) 137/54   Wt Readings from Last 3 Encounters:  11/20/21 221 lb 14.4 oz (100.7 kg)  11/06/21 228 lb (103.4 kg)  08/13/21 228 lb 14.4 oz (103.8 kg)      Physical Exam Vitals and nursing note reviewed.  Constitutional:      Appearance: Normal appearance. He is obese.  HENT:     Head: Normocephalic and atraumatic.  Eyes:     Pupils: Pupils are equal, round, and reactive to light.  Neck:     Comments: Firm thyroid; repeat labs- if abnormal recommend [place] Cardiovascular:     Rate and Rhythm: Normal rate and regular rhythm.     Pulses: Normal pulses.     Heart sounds: Normal heart sounds.  Pulmonary:     Effort: Pulmonary effort is normal.     Breath sounds: Normal breath sounds.  Musculoskeletal:        General: Normal range of motion.     Cervical back: Normal range of motion.  Skin:    General: Skin is warm and dry.     Capillary Refill: Capillary refill takes less than 2 seconds.  Neurological:     General: No focal deficit present.     Mental Status: He is alert and oriented to person, place, and time. Mental status is at baseline.     No results found for any visits on 11/20/21.  Assessment & Plan     Problem List Items Addressed This Visit       Cardiovascular and Mediastinum   Essential hypertension - Primary    Chronic, stable At goal Continue medication- Norvasx 10 mg, Atenolol 50 mg      Relevant Orders   Comprehensive Metabolic Panel (CMET)     Endocrine   Hypothyroidism    Chronic, previously  overstimulated/under treated Repeat labs today Endorses weight loss Currently taking 100 mcg/day synthroid       Relevant Orders   TSH + free T4     Genitourinary   Stage 3a chronic kidney disease (HCC)    Chronic, stable Has been seen by nephro Repeat CMP Denies urinary complaints       Relevant Orders   Comprehensive Metabolic Panel (CMET)     Other   Schizophrenia (HCC)    Chronic, stable Continue etrafon/triavil 4-25 mg nightly Continue restoril 15 mg nightly PRN       Return in about 6 months (around 05/23/2022) for annual examination.     Vonna Kotyk, FNP, have reviewed all documentation for this  visit. The documentation on 11/20/21 for the exam, diagnosis, procedures, and orders are all accurate and complete.  Aaron Stokes, [place] [phone] (phone) [phone] (fax)  [place]

## 2021-11-20 NOTE — Assessment & Plan Note (Signed)
Chronic, previously overstimulated/under treated Repeat labs today Endorses weight loss Currently taking 100 mcg/day synthroid

## 2021-11-20 NOTE — Assessment & Plan Note (Signed)
Chronic, stable Has been seen by nephro Repeat CMP Denies urinary complaints

## 2021-11-20 NOTE — Assessment & Plan Note (Signed)
Chronic, stable Continue etrafon/triavil 4-25 mg nightly Continue restoril 15 mg nightly PRN

## 2021-11-21 ENCOUNTER — Other Ambulatory Visit: Payer: Self-pay | Admitting: Family Medicine

## 2021-11-21 ENCOUNTER — Other Ambulatory Visit: Payer: Self-pay

## 2021-11-21 DIAGNOSIS — E039 Hypothyroidism, unspecified: Secondary | ICD-10-CM

## 2021-11-21 LAB — COMPREHENSIVE METABOLIC PANEL
ALT: 11 IU/L (ref 0–44)
AST: 18 IU/L (ref 0–40)
Albumin/Globulin Ratio: 1.5 (ref 1.2–2.2)
Albumin: 4.4 g/dL (ref 3.8–4.8)
Alkaline Phosphatase: 91 IU/L (ref 44–121)
BUN/Creatinine Ratio: 14 (ref 10–24)
BUN: 15 mg/dL (ref 8–27)
Bilirubin Total: 1.4 mg/dL — ABNORMAL HIGH (ref 0.0–1.2)
CO2: 24 mmol/L (ref 20–29)
Calcium: 10.1 mg/dL (ref 8.6–10.2)
Chloride: 104 mmol/L (ref 96–106)
Creatinine, Ser: 1.09 mg/dL (ref 0.76–1.27)
Globulin, Total: 3 g/dL (ref 1.5–4.5)
Glucose: 94 mg/dL (ref 70–99)
Potassium: 4 mmol/L (ref 3.5–5.2)
Sodium: 142 mmol/L (ref 134–144)
Total Protein: 7.4 g/dL (ref 6.0–8.5)
eGFR: 69 mL/min/{1.73_m2} (ref >59)

## 2021-11-21 LAB — TSH+FREE T4
Free T4: 2.02 ng/dL — ABNORMAL HIGH (ref 0.82–1.77)
TSH: 0.012 u[IU]/mL — ABNORMAL LOW (ref 0.450–4.500)

## 2021-11-21 MED ORDER — LEVOTHYROXINE SODIUM 75 MCG PO TABS: 75.0000 ug | ORAL_TABLET | Freq: Every day | ORAL | 0 refills | Status: DC

## 2021-11-21 NOTE — Telephone Encounter (Signed)
Requested Prescriptions  Pending Prescriptions Disp Refills  . levothyroxine (SYNTHROID) 75 MCG tablet [Pharmacy Med Name: LEVOTHYROXINE 0.'075MG'$  (75MCG) TABS] 90 tablet     Sig: TAKE 1 TABLET(75 MCG) BY MOUTH DAILY. STOP 100 MCG     Endocrinology:  Hypothyroid Agents Failed - 11/21/2021  8:41 AM      Failed - TSH in normal range and within 360 days    TSH  Date Value Ref Range Status  11/20/2021 0.012 (L) 0.450 - 4.500 uIU/mL Final         Passed - Valid encounter within last 12 months    Recent Outpatient Visits          Yesterday Essential hypertension   [facility] [name], FNP   6 months ago Annual physical exam   [facility] [name], FNP   1 year ago Primary hypertension   [facility] [place], Dionne Bucy, MD   1 year ago Hypertension, unspecified type   [facility] [place], Kelby Aline, FNP   1 year ago Medicare annual wellness visit, subsequent   [facility] [place], [place], [place]

## 2021-11-21 NOTE — Progress Notes (Signed)
Kidney function is recovered from 6 months ago. Very reassuring.  However, TSH is over corrected and requires dose adjustment with repeat labs following 1 month of use. Recommend [place] of thyroid as well.  [name], [address] [place], [place] [address] [phone] (phone) [phone] (fax) [place]

## 2021-11-24 ENCOUNTER — Ambulatory Visit: Payer: Self-pay | Admitting: *Deleted

## 2021-11-24 NOTE — Telephone Encounter (Addendum)
Summary: [place] advice   Pts brother in law is calling to ask an order was place for the patient to have an UC wanting to know what is the purpose of the UC. Please advise      Pt's brother in law, Aaron Stokes called to ask why an [place] of Mr. Aaron Stokes is needed. Explained labs and dose changes as per Daneil Dan Payne's note. Date and time of [place] reviewed with Mr. Aaron Stokes.

## 2021-12-01 ENCOUNTER — Ambulatory Visit
Admission: RE | Admit: 2021-12-01 | Discharge: 2021-12-01 | Disposition: A | Discharge status: Home / Self Care | Payer: Medicare Other | Source: Ambulatory Visit | Attending: Family Medicine | Admitting: Family Medicine

## 2021-12-01 DIAGNOSIS — E039 Hypothyroidism, unspecified: Secondary | ICD-10-CM | POA: Diagnosis present

## 2021-12-02 ENCOUNTER — Other Ambulatory Visit: Payer: Self-pay | Admitting: Family Medicine

## 2021-12-02 ENCOUNTER — Encounter: Payer: Self-pay | Admitting: Ophthalmology

## 2021-12-02 DIAGNOSIS — I898 Other specified noninfective disorders of lymphatic vessels and lymph nodes: Secondary | ICD-10-CM

## 2021-12-02 NOTE — Progress Notes (Signed)
Thyroid US show normal thyroid; however, there is a calcified area that was not well visualized on [place]. Recommendation is for a CT, soft tissue, of the neck for further evaluation if the patient is agreeable to decipher granulomatous disease.  [name], [address] [place], [place] [address] [phone] (phone) [phone] (fax) [place]

## 2021-12-08 ENCOUNTER — Ambulatory Visit: Payer: Medicare Other

## 2021-12-09 ENCOUNTER — Ambulatory Visit
Admission: RE | Admit: 2021-12-09 | Discharge: 2021-12-09 | Disposition: A | Discharge status: Home / Self Care | Payer: Medicare Other | Source: Ambulatory Visit | Attending: Family Medicine | Admitting: Family Medicine

## 2021-12-09 DIAGNOSIS — I898 Other specified noninfective disorders of lymphatic vessels and lymph nodes: Secondary | ICD-10-CM | POA: Diagnosis present

## 2021-12-09 MED ORDER — IOHEXOL 300 MG/ML  SOLN
75.0000 mL | Freq: Once | INTRAMUSCULAR | Status: AC | PRN
  Administered 2021-12-09: 75 mL via INTRAVENOUS

## 2021-12-10 NOTE — Progress Notes (Signed)
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 No neck mass or Lymphadenopathy; which is reassuring.  [name], [address] [place], [place] [address] [phone] (phone) [phone] (fax) [place]

## 2021-12-15 NOTE — Discharge Instructions (Signed)

## 2021-12-16 ENCOUNTER — Encounter
Admission: RE | Disposition: A | Discharge status: Home / Self Care | Payer: Self-pay | Source: Home / Self Care | Attending: Ophthalmology

## 2021-12-16 ENCOUNTER — Other Ambulatory Visit: Payer: Self-pay

## 2021-12-16 ENCOUNTER — Ambulatory Visit (AMBULATORY_SURGERY_CENTER): Payer: Medicare Other | Admitting: Anesthesiology

## 2021-12-16 ENCOUNTER — Ambulatory Visit
Admission: RE | Admit: 2021-12-16 | Discharge: 2021-12-16 | Disposition: A | Discharge status: Home / Self Care | Payer: Medicare Other | Attending: Ophthalmology | Admitting: Ophthalmology

## 2021-12-16 ENCOUNTER — Ambulatory Visit: Payer: Medicare Other | Admitting: Anesthesiology

## 2021-12-16 ENCOUNTER — Encounter: Payer: Self-pay | Admitting: Ophthalmology

## 2021-12-16 DIAGNOSIS — N189 Chronic kidney disease, unspecified: Secondary | ICD-10-CM | POA: Diagnosis not present

## 2021-12-16 DIAGNOSIS — F419 Anxiety disorder, unspecified: Secondary | ICD-10-CM | POA: Diagnosis not present

## 2021-12-16 DIAGNOSIS — I129 Hypertensive chronic kidney disease with stage 1 through stage 4 chronic kidney disease, or unspecified chronic kidney disease: Secondary | ICD-10-CM | POA: Diagnosis not present

## 2021-12-16 DIAGNOSIS — I10 Essential (primary) hypertension: Secondary | ICD-10-CM

## 2021-12-16 DIAGNOSIS — H2512 Age-related nuclear cataract, left eye: Secondary | ICD-10-CM | POA: Diagnosis present

## 2021-12-16 DIAGNOSIS — E039 Hypothyroidism, unspecified: Secondary | ICD-10-CM | POA: Diagnosis not present

## 2021-12-16 HISTORY — PX: CATARACT EXTRACTION W/PHACO: SHX586

## 2021-12-16 SURGERY — PHACOEMULSIFICATION, CATARACT, WITH IOL INSERTION
Anesthesia: Topical | Site: Eye | Laterality: Left

## 2021-12-16 MED ORDER — MIDAZOLAM HCL 2 MG/2ML IJ SOLN: INTRAMUSCULAR | Status: DC | PRN

## 2021-12-16 MED ORDER — MOXIFLOXACIN HCL 0.5 % OP SOLN
OPHTHALMIC | Status: DC | PRN
  Administered 2021-12-16: 0.2 mL via OPHTHALMIC

## 2021-12-16 MED ORDER — SIGHTPATH DOSE#1 BSS IO SOLN
INTRAOCULAR | Status: DC | PRN
  Administered 2021-12-16: 2 mL

## 2021-12-16 MED ORDER — SIGHTPATH DOSE#1 BSS IO SOLN
INTRAOCULAR | Status: DC | PRN
  Administered 2021-12-16: 76 mL via OPHTHALMIC

## 2021-12-16 MED ORDER — TETRACAINE HCL 0.5 % OP SOLN
1.0000 [drp] | OPHTHALMIC | Status: DC | PRN
  Administered 2021-12-16 (×3): 1 [drp] via OPHTHALMIC

## 2021-12-16 MED ORDER — SIGHTPATH DOSE#1 NA CHONDROIT SULF-NA HYALURON 40-17 MG/ML IO SOLN
INTRAOCULAR | Status: DC | PRN
  Administered 2021-12-16: 1 mL via INTRAOCULAR

## 2021-12-16 MED ORDER — FENTANYL CITRATE (PF) 100 MCG/2ML IJ SOLN
INTRAMUSCULAR | Status: DC | PRN
Start: 2021-12-16 — End: 2021-12-16
  Administered 2021-12-16 (×2): 50 ug via INTRAVENOUS

## 2021-12-16 MED ORDER — FENTANYL CITRATE PF 50 MCG/ML IJ SOSY: 25.0000 ug | PREFILLED_SYRINGE | INTRAMUSCULAR | Status: DC | PRN

## 2021-12-16 MED ORDER — ARMC OPHTHALMIC DILATING DROPS
1.0000 | OPHTHALMIC | Status: DC | PRN
  Administered 2021-12-16 (×3): 1 via OPHTHALMIC

## 2021-12-16 MED ORDER — ONDANSETRON HCL 4 MG/2ML IJ SOLN
4.0000 mg | Freq: Once | INTRAMUSCULAR | Status: DC | PRN
Start: 2021-12-16 — End: 2021-12-16

## 2021-12-16 MED ORDER — SIGHTPATH DOSE#1 BSS IO SOLN
INTRAOCULAR | Status: DC | PRN
  Administered 2021-12-16: 15 mL via INTRAOCULAR

## 2021-12-16 MED ORDER — BRIMONIDINE TARTRATE-TIMOLOL 0.2-0.5 % OP SOLN
OPHTHALMIC | Status: DC | PRN
  Administered 2021-12-16: 1 [drp] via OPHTHALMIC

## 2021-12-16 SURGICAL SUPPLY — 16 items
CANNULA ANT/CHMB 27G (MISCELLANEOUS) IMPLANT
CANNULA ANT/CHMB 27GA (MISCELLANEOUS) IMPLANT
CATARACT SUITE SIGHTPATH (MISCELLANEOUS) ×1 IMPLANT
FEE CATARACT SUITE SIGHTPATH (MISCELLANEOUS) ×1 IMPLANT
GLOVE SURG ENC TEXT LTX SZ8 (GLOVE) ×1 IMPLANT
GLOVE SURG TRIUMPH 8.0 PF LTX (GLOVE) ×1 IMPLANT
LENS IOL TECNIS EYHANCE 21.0 (Intraocular Lens) IMPLANT
NDL FILTER BLUNT 18X1 1/2 (NEEDLE) ×1 IMPLANT
NEEDLE FILTER BLUNT 18X 1/2SAF (NEEDLE) ×1
NEEDLE FILTER BLUNT 18X1 1/2 (NEEDLE) ×1 IMPLANT
RING MALYGIN (MISCELLANEOUS) IMPLANT
SOLUTION OPHTHALMIC SALT (MISCELLANEOUS) IMPLANT
SUT ETHILON 10-0 CS-B-6CS-B-6 (SUTURE) ×1
SUTURE EHLN 10-0 CS-B-6CS-B-6 (SUTURE) IMPLANT
SYR 3ML LL SCALE MARK (SYRINGE) ×1 IMPLANT
WATER STERILE IRR 250ML POUR (IV SOLUTION) ×1 IMPLANT

## 2021-12-16 NOTE — Anesthesia Preprocedure Evaluation (Addendum)
Anesthesia Evaluation  Patient identified by MRN, date of birth, ID band Patient awake    Reviewed: Allergy & Precautions, H&P , NPO status , Patient's Chart, lab work & pertinent test results, reviewed documented beta blocker date and time   Airway Mallampati: II  TM Distance: >3 FB Neck ROM: full    Dental no notable dental hx. (+) Teeth Intact   Pulmonary neg pulmonary ROS,    Pulmonary exam normal breath sounds clear to auscultation       Cardiovascular Exercise Tolerance: Good hypertension, On Medications + Peripheral Vascular Disease  Normal cardiovascular exam Rhythm:regular Rate:Normal     Neuro/Psych PSYCHIATRIC DISORDERS Anxiety Schizophrenia negative neurological ROS     GI/Hepatic Neg liver ROS, GERD  Medicated,  Endo/Other  Hypothyroidism   Renal/GU Renal disease  negative genitourinary   Musculoskeletal   Abdominal   Peds  Hematology negative hematology ROS (+)   Anesthesia Other Findings   Reproductive/Obstetrics negative OB ROS                            Anesthesia Physical Anesthesia Plan  ASA: 3  Anesthesia Plan: MAC   Post-op Pain Management:    Induction:   PONV Risk Score and Plan:   Airway Management Planned:   Additional Equipment:   Intra-op Plan:   Post-operative Plan:   Informed Consent: I have reviewed the patients History and Physical, chart, labs and discussed the procedure including the risks, benefits and alternatives for the proposed anesthesia with the patient or authorized representative who has indicated his/her understanding and acceptance.       Plan Discussed with: CRNA  Anesthesia Plan Comments:         Anesthesia Quick Evaluation

## 2021-12-16 NOTE — Anesthesia Postprocedure Evaluation (Signed)
Anesthesia Post Note  Patient: Aaron Stokes  Procedure(s) Performed: CATARACT EXTRACTION PHACO  LEFT MALYUGIN RING 6.47 00:50.5  (Left: Eye)     Patient location during evaluation: PACU Anesthesia Type: MAC Level of consciousness: awake and alert Pain management: pain level controlled Vital Signs Assessment: post-procedure vital signs reviewed and stable Respiratory status: spontaneous breathing, nonlabored ventilation, respiratory function stable and patient connected to nasal cannula oxygen Cardiovascular status: stable and blood pressure returned to baseline Postop Assessment: no apparent nausea or vomiting Anesthetic complications: no   There were no known notable events for this encounter.  Molli Barrows

## 2021-12-16 NOTE — Transfer of Care (Signed)
Immediate Anesthesia Transfer of Care Note  Patient: Aaron Stokes  Procedure(s) Performed: CATARACT EXTRACTION PHACO  LEFT MALYUGIN RING 6.47 00:50.5  (Left: Eye)  Patient Location: PACU  Anesthesia Type: No value filed.  Level of Consciousness: awake, alert  and patient cooperative  Airway and Oxygen Therapy: Patient Spontanous Breathing and Patient connected to supplemental oxygen  Post-op Assessment: Post-op Vital signs reviewed, Patient's Cardiovascular Status Stable, Respiratory Function Stable, Patent Airway and No signs of Nausea or vomiting  Post-op Vital Signs: Reviewed and stable  Complications: There were no known notable events for this encounter.

## 2021-12-16 NOTE — Op Note (Signed)
PREOPERATIVE DIAGNOSIS:  Nuclear sclerotic cataract of the left eye.   POSTOPERATIVE DIAGNOSIS:  Nuclear sclerotic cataract of the left eye.   OPERATIVE PROCEDURE:ORPROCALL@   SURGEON:  Aaron Robson, MD.   ANESTHESIA:  Anesthesiologist: Molli Barrows, MD CRNA: Ester Rink, CRNA  1.      Managed anesthesia care. 2.     0.68m of Shugarcaine was instilled following the paracentesis   COMPLICATIONS: 1) Immediately following the creation of the main incision, the iris prolapsed out of the eye. It was gently reposited and a Malyugin ring was placed.  2) Upon the initiation of the capsulorhexis  it became evident that there was significant zonular laxity. A CCR was made with difficulty. 3) Upon the removal of the last section of nuclear and cortical material it became evident that the capsule was completely folding in upon itself and there was a linear rent in the posterior aspect.  No vitreous presented. 4) Given the complete lack of capsular support and the extremely poor/ atrophic state of the iris, a lens was not placed .   TECHNIQUE:   Stop and chop   DESCRIPTION OF PROCEDURE:  The patient was examined and consented in the preoperative holding area where the aforementioned topical anesthesia was applied to the left eye and then brought back to the Operating Room where the left eye was prepped and draped in the usual sterile ophthalmic fashion and a lid speculum was placed. A paracentesis was created with the side port blade and the anterior chamber was filled with viscoelastic. A near clear corneal incision was performed with the steel keratome. A continuous curvilinear capsulorrhexis was performed with a cystotome followed by the capsulorrhexis forceps. Hydrodissection and hydrodelineation were carried out with BSS on a blunt cannula. The lens was removed in a stop and chop  technique and most of  the remaining cortical material was removed with the irrigation-aspiration handpiece.The  remaining viscoelastic was removed from the eye with the irrigation-aspiration handpiece. The wounds were hydrated. The anterior chamber was flushed with BSS and the eye was inflated to physiologic pressure.  A single 10-0 suture was placed int the main incision due to ealier iris prolapse. 0.116mVigamox was placed in the anterior chamber. The wounds were found to be water tight. The eye was dressed with Combigan. The patient was given protective glasses to wear throughout the day and a shield with which to sleep tonight. The patient was also given drops with which to begin a drop regimen today and will follow-up with me in one day. Implant Name Type Inv. Item Serial No. Manufacturer Lot No. LRB No. Used Action  LENS IOL TECNIS EYHANCE 21.0 - [id number] Lens LENS IOL TECNIS EYHANCE 21.0 [id number]  Left 1 Wasted    Procedure(s): CATARACT EXTRACTION PHACO  LEFT MALYUGIN RING 6.47 00:50.5  (Left)  Electronically signed: WiBirder Stokes/29/2023 12:51 PM

## 2021-12-16 NOTE — H&P (Signed)
[facility]   Primary Care Physician:  [name], FNP Ophthalmologist: Dr. Benay Pillow  Pre-Procedure History & Physical: HPI:  Aaron Stokes is a 81 y.o. male here for cataract surgery.   Past Medical History:  Diagnosis Date   Acid reflux    Anxiety    Chronic kidney disease    UTI; hematuria   Hypertension    Insomnia    Mentally challenged     Past Surgical History:  Procedure Laterality Date   CIRCUMCISION, NON-NEWBORN     CYSTOSCOPY WITH STENT PLACEMENT Right 08/21/2015   Procedure: cystoscopy;  Surgeon: Nickie Retort, MD;  Location: [facility] ORS;  Service: Urology;  Laterality: Right;   CYSTOSCOPY WITH STENT PLACEMENT Right 12/11/2015   Procedure: CYSTOSCOPY WITH STENT PLACEMENT;  Surgeon: Nickie Retort, MD;  Location: [facility] ORS;  Service: Urology;  Laterality: Right;   HOLEP-LASER ENUCLEATION OF THE PROSTATE WITH MORCELLATION N/A 10/23/2015   Procedure: HOLEP-LASER ENUCLEATION OF THE PROSTATE WITH MORCELLATION;  Surgeon: Hollice Espy, MD;  Location: [facility] ORS;  Service: Urology;  Laterality: N/A;   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 Authorizing Provider  amLODipine (NORVASC) 10 MG tablet Take 1 tablet (10 mg total) by mouth daily. 05/08/21  Yes Tally Joe T, FNP  ASPIRIN LOW DOSE 81 MG EC tablet TAKE 1 TABLET(81 MG) BY MOUTH DAILY 12/05/20  Yes Tally Joe T, FNP  atenolol (TENORMIN) 50 MG tablet TAKE 1 TABLET(50 MG) BY MOUTH DAILY 05/08/21  Yes Tally Joe T, FNP  finasteride (PROSCAR) 5 MG tablet TAKE 1 TABLET(5 MG) BY MOUTH  DAILY 05/08/21  Yes Tally Joe T, FNP  levothyroxine (SYNTHROID) 75 MCG tablet TAKE 1 TABLET(75 MCG) BY MOUTH DAILY. STOP 100 MCG 11/21/21  Yes Tally Joe T, FNP  perphenazine-amitriptyline (ETRAFON/TRIAVIL) 4-25 MG TABS tablet Take 1 tablet by mouth at bedtime. 05/08/21  Yes [name], FNP  temazepam (RESTORIL) 15 MG capsule TAKE 1 CAPSULE(15 MG) BY MOUTH AT BEDTIME AS NEEDED FOR SLEEP 05/08/21  Yes [name], FNP    Allergies as of 11/06/2021   (No Known Allergies)    Family History  Problem Relation Age of Onset   Bladder Cancer Father    Cancer Father    Bladder Cancer Paternal Grandmother    Cancer Brother    Prostate cancer Neg Hx     Social History   Socioeconomic History   Marital status: Divorced    Spouse name: Not on file   Number of children: 0   Years of education: Not on file   Highest education level: 12th grade  Occupational History   Occupation: retired  Tobacco Use   Smoking status: Never   Smokeless tobacco: Never  Vaping Use   Vaping Use: Never used  Substance and Sexual Activity   Alcohol use: No    Alcohol/week: 0.0 standard drinks of alcohol   Drug use: No   Sexual activity: Not Currently  Other Topics Concern   Not on file  Social History Narrative   Not on file   Social Determinants of Health  Financial Resource Strain: Low Risk  (11/06/2021)   Overall Financial Resource Strain (CARDIA)    Difficulty of Paying Living Expenses: Not hard at all  Food Insecurity: No Food Insecurity (11/06/2021)   Hunger Vital Sign    Worried About Running Out of Food in the Last Year: Never true    Ran Out of Food in the Last Year: Never true  Transportation Needs: No Transportation Needs (11/06/2021)   PRAPARE - Hydrologist (Medical): No    Lack of Transportation (Non-Medical): No  Physical Activity: Insufficiently Active (11/06/2021)   Exercise Vital Sign    Days of Exercise per Week: 1 day    Minutes of Exercise  per Session: 30 min  Stress: No Stress Concern Present (11/06/2021)   [place]    Feeling of Stress : Not at all  Social Connections: Socially Isolated (11/06/2021)   Social Connection and Isolation Panel [NHANES]    Frequency of Communication with Friends and Family: Never    Frequency of Social Gatherings with Friends and Family: Once a week    Attends Religious Services: Never    Marine scientist or Organizations: No    Attends Archivist Meetings: Never    Marital Status: Divorced  Human resources officer Violence: Not At Risk (11/06/2021)   Humiliation, Afraid, Rape, and Kick questionnaire    Fear of Current or Ex-Partner: No    Emotionally Abused: No    Physically Abused: No    Sexually Abused: No    Review of Systems: See HPI, otherwise negative ROS  Physical Exam: BP (!) 143/72   Pulse 60   Temp (!) 97.2 F (36.2 C) (Temporal)   Ht '5\' 11"'$  (1.803 m)   Wt 98.4 kg   SpO2 98%   BMI 30.27 kg/m  General:   Alert, cooperative in NAD Head:  Normocephalic and atraumatic. Respiratory:  Normal work of breathing. Cardiovascular:  RRR  Impression/Plan: Aaron Stokes is here for cataract surgery.  Risks, benefits, limitations, and alternatives regarding cataract surgery have been reviewed with the patient.  Questions have been answered.  All parties agreeable.   Birder Robson, MD  12/16/2021, 11:55 AM

## 2021-12-17 ENCOUNTER — Encounter: Payer: Self-pay | Admitting: Ophthalmology

## 2021-12-19 ENCOUNTER — Other Ambulatory Visit: Payer: Self-pay | Admitting: Family Medicine

## 2021-12-19 DIAGNOSIS — G4701 Insomnia due to medical condition: Secondary | ICD-10-CM

## 2021-12-19 NOTE — Telephone Encounter (Signed)
Requested medication (s) are due for refill today - no  Requested medication (s) are on the active medication list -yes  Future visit scheduled -no  Last refill: 05/08/21 #90 3RF  Notes to clinic: non delegated Rx  Requested Prescriptions  Pending Prescriptions Disp Refills   temazepam (RESTORIL) 15 MG capsule [Pharmacy Med Name: TEMAZEPAM '15MG'$  CAPSULES] 90 capsule     Sig: TAKE 1 CAPSULE(15 MG) BY MOUTH AT BEDTIME AS NEEDED FOR SLEEP     Not Delegated - Psychiatry: Anxiolytics/Hypnotics 2 Failed - 12/19/2021 11:29 AM      Failed - This refill cannot be delegated      Failed - Urine Drug Screen completed in last 360 days      Passed - Patient is not pregnant      Passed - Valid encounter within last 6 months    Recent Outpatient Visits           4 weeks ago Essential hypertension   [facility] [name], FNP   7 months ago Annual physical exam   [facility] [name], FNP   1 year ago Primary hypertension   [facility] [place], Dionne Bucy, MD   1 year ago Hypertension, unspecified type   [facility] [place], Kelby Aline, FNP   1 year ago Medicare annual wellness visit, subsequent   [place], [place], [place]                 Requested Prescriptions  Pending Prescriptions Disp Refills   temazepam (RESTORIL) 15 MG capsule [Pharmacy Med Name: TEMAZEPAM '15MG'$  CAPSULES] 90 capsule     Sig: TAKE 1 CAPSULE(15 MG) BY MOUTH AT BEDTIME AS NEEDED FOR SLEEP     Not Delegated - Psychiatry: Anxiolytics/Hypnotics 2 Failed - 12/19/2021 11:29 AM      Failed - This refill cannot be delegated      Failed - Urine Drug Screen completed in last 360 days      Passed - Patient is not pregnant      Passed - Valid encounter within last 6 months    Recent Outpatient Visits           4 weeks ago Essential hypertension   [facility] [name], FNP   7 months ago Annual  physical exam   [facility] [name], FNP   1 year ago Primary hypertension   [facility] [place], Dionne Bucy, MD   1 year ago Hypertension, unspecified type   [facility] Fl[place], Kelby Aline, FNP   1 year ago Medicare annual wellness visit, subsequent   [place], [place], [place]

## 2022-01-06 ENCOUNTER — Ambulatory Visit: Admission: RE | Admit: 2022-01-06 | Payer: Medicare Other | Source: Home / Self Care | Admitting: Ophthalmology

## 2022-01-06 ENCOUNTER — Encounter: Admission: RE | Payer: Self-pay | Source: Home / Self Care

## 2022-01-06 LAB — TSH: TSH: 0.616 u[IU]/mL (ref 0.450–4.500)

## 2022-01-06 SURGERY — PHACOEMULSIFICATION, CATARACT, WITH IOL INSERTION
Anesthesia: Topical | Laterality: Right

## 2022-01-07 NOTE — Progress Notes (Signed)
Thyroid has stabilized.  Aaron Stokes, [address] [place], [place] [address] [phone] (phone) [phone] (fax) [place]

## 2022-02-04 ENCOUNTER — Other Ambulatory Visit: Payer: Self-pay | Admitting: Family Medicine

## 2022-02-04 DIAGNOSIS — I739 Peripheral vascular disease, unspecified: Secondary | ICD-10-CM

## 2022-03-10 ENCOUNTER — Encounter: Payer: Self-pay | Admitting: Urology

## 2022-03-31 ENCOUNTER — Other Ambulatory Visit: Payer: Self-pay | Admitting: Family Medicine

## 2022-03-31 DIAGNOSIS — F209 Schizophrenia, unspecified: Secondary | ICD-10-CM

## 2022-03-31 DIAGNOSIS — G4701 Insomnia due to medical condition: Secondary | ICD-10-CM

## 2022-03-31 NOTE — Telephone Encounter (Signed)
Requested medication (s) are due for refill today - no  Requested medication (s) are on the active medication list -yes  Future visit scheduled -yes  Last refill: 05/08/21 #90 3RF  Notes to clinic: non delegated Rx  Requested Prescriptions  Pending Prescriptions Disp Refills   perphenazine-amitriptyline (ETRAFON/TRIAVIL) 4-25 MG TABS tablet [Pharmacy Med Name: PERPHENAZINE/AMITRIPTYLINE 4-25 TAB] 90 tablet 3    Sig: Take 1 tablet by mouth at bedtime.     Not Delegated - Psychiatry:  Antidepressants - Heterocyclics (TCAs) - amitriptyline / perphenazine Failed - 03/31/2022  6:31 AM      Failed - This refill cannot be delegated      Failed - Manual Review: Eye exam recommended every 12 months if age >26 otherwise every 24 months if age <40      Failed - CBC completed in the last 12 months    WBC  Date Value Ref Range Status  08/05/2020 6.1 3.4 - 10.8 x10E3/uL Final  12/05/2015 4.1 3.8 - 10.6 K/uL Final   RBC  Date Value Ref Range Status  08/05/2020 4.39 4.14 - 5.80 x10E6/uL Final  12/05/2015 3.94 (L) 4.40 - 5.90 MIL/uL Final   Hemoglobin  Date Value Ref Range Status  08/05/2020 13.4 13.0 - 17.7 g/dL Final   Hematocrit  Date Value Ref Range Status  08/05/2020 39.8 37.5 - 51.0 % Final   MCHC  Date Value Ref Range Status  08/05/2020 33.7 31.5 - 35.7 g/dL Final  12/05/2015 33.6 32.0 - 36.0 g/dL Final   [facility]  Date Value Ref Range Status  08/05/2020 30.5 26.6 - 33.0 pg Final  12/05/2015 30.5 26.0 - 34.0 pg Final   MCV  Date Value Ref Range Status  08/05/2020 91 79 - 97 fL Final   No results found for: "PLTCOUNTKUC", "LABPLAT", "POCPLA" RDW  Date Value Ref Range Status  08/05/2020 12.0 11.6 - 15.4 % Final         Passed - Completed PHQ-2 or PHQ-9 in the last 360 days      Passed - Last BP in normal range    BP Readings from Last 1 Encounters:  12/16/21 138/70         Passed - Last Heart Rate in normal range    Pulse Readings from Last 1 Encounters:  12/16/21  (!) 54         Passed - Valid encounter within last 6 months    Recent Outpatient Visits           4 months ago Essential hypertension   [facility] [name], FNP   10 months ago Annual physical exam   [facility] [name], FNP   1 year ago Primary hypertension   [facility], Dionne Bucy, [place]   1 year ago Hypertension, unspecified type   [place], Kelby Aline, FNP   2 years ago Medicare annual wellness visit, subsequent   [employer], Clearnce Sorrel, [place]       Future Appointments             In 1 month [name], [place], [facility]   In 2 months [name], MD [facility] [place]            Passed - Lipid Panel completed within the last 12 months    Cholesterol, Total  Date Value Ref Range Status  05/08/2021 163 100 - 199 mg/dL Final  LDL Chol Calc (NIH)  Date Value Ref Range Status  05/08/2021 95 0 - 99 mg/dL Final   HDL  Date Value Ref Range Status  05/08/2021 56 >39 mg/dL Final   Triglycerides  Date Value Ref Range Status  05/08/2021 60 0 - 149 mg/dL Final         Passed - CMP completed in the last 12 months    Albumin  Date Value Ref Range Status  11/20/2021 4.4 3.8 - 4.8 g/dL Final   Alkaline Phosphatase  Date Value Ref Range Status  11/20/2021 91 44 - 121 IU/L Final   ALT  Date Value Ref Range Status  11/20/2021 11 0 - 44 IU/L Final   AST  Date Value Ref Range Status  11/20/2021 18 0 - 40 IU/L Final   BUN  Date Value Ref Range Status  11/20/2021 15 8 - 27 mg/dL Final   Calcium  Date Value Ref Range Status  11/20/2021 10.1 8.6 - 10.2 mg/dL Final   CO2  Date Value Ref Range Status  11/20/2021 24 20 - 29 mmol/L Final   Creatinine, Ser  Date Value Ref Range Status  11/20/2021 1.09 0.76 - 1.27 mg/dL Final   Glucose  Date Value Ref Range Status  11/20/2021 94 70 - 99 mg/dL Final    Glucose, Bld  Date Value Ref Range Status  12/05/2015 93 65 - 99 mg/dL Final   Potassium  Date Value Ref Range Status  11/20/2021 4.0 3.5 - 5.2 mmol/L Final   Sodium  Date Value Ref Range Status  11/20/2021 142 134 - 144 mmol/L Final   Bilirubin Total  Date Value Ref Range Status  11/20/2021 1.4 (H) 0.0 - 1.2 mg/dL Final   Protein, ur  Date Value Ref Range Status  10/09/2015 >500 (A) NEGATIVE mg/dL Final   Protein, UA  Date Value Ref Range Status  12/19/2015 CANCELED      Comment:    Test not performed  Result canceled by the ancillary    Total Protein  Date Value Ref Range Status  11/20/2021 7.4 6.0 - 8.5 g/dL Final   GFR calc Af Amer  Date Value Ref Range Status  02/21/2020 60 >59 mL/min/1.73 Final    Comment:    **In accordance with recommendations from the NKF-ASN Task force,**   [facility] is in the process of updating its eGFR calculation to the   2021 CKD-EPI creatinine equation that estimates kidney function   without a race variable.    eGFR  Date Value Ref Range Status  11/20/2021 69 >59 mL/min/1.73 Final   GFR calc non Af Amer  Date Value Ref Range Status  02/21/2020 52 (L) >59 mL/min/1.73 Final            Requested Prescriptions  Pending Prescriptions Disp Refills   perphenazine-amitriptyline (ETRAFON/TRIAVIL) 4-25 MG TABS tablet [Pharmacy Med Name: PERPHENAZINE/AMITRIPTYLINE 4-25 TAB] 90 tablet 3    Sig: Take 1 tablet by mouth at bedtime.     Not Delegated - Psychiatry:  Antidepressants - Heterocyclics (TCAs) - amitriptyline / perphenazine Failed - 03/31/2022  6:31 AM      Failed - This refill cannot be delegated      Failed - Manual Review: Eye exam recommended every 12 months if age >50 otherwise every 24 months if age <40      Failed - CBC completed in the last 12 months    WBC  Date Value Ref Range Status  08/05/2020 6.1 3.4 - 10.8 x10E3/uL Final  12/05/2015 4.1 3.8 - 10.6 K/uL Final   RBC  Date Value Ref Range Status   08/05/2020 4.39 4.14 - 5.80 x10E6/uL Final  12/05/2015 3.94 (L) 4.40 - 5.90 MIL/uL Final   Hemoglobin  Date Value Ref Range Status  08/05/2020 13.4 13.0 - 17.7 g/dL Final   Hematocrit  Date Value Ref Range Status  08/05/2020 39.8 37.5 - 51.0 % Final   MCHC  Date Value Ref Range Status  08/05/2020 33.7 31.5 - 35.7 g/dL Final  12/05/2015 33.6 32.0 - 36.0 g/dL Final   [facility]  Date Value Ref Range Status  08/05/2020 30.5 26.6 - 33.0 pg Final  12/05/2015 30.5 26.0 - 34.0 pg Final   MCV  Date Value Ref Range Status  08/05/2020 91 79 - 97 fL Final   No results found for: "PLTCOUNTKUC", "LABPLAT", "POCPLA" RDW  Date Value Ref Range Status  08/05/2020 12.0 11.6 - 15.4 % Final         Passed - Completed PHQ-2 or PHQ-9 in the last 360 days      Passed - Last BP in normal range    BP Readings from Last 1 Encounters:  12/16/21 138/70         Passed - Last Heart Rate in normal range    Pulse Readings from Last 1 Encounters:  12/16/21 (!) 54         Passed - Valid encounter within last 6 months    Recent Outpatient Visits           4 months ago Essential hypertension   [facility] [name], FNP   10 months ago Annual physical exam   [facility] [name], FNP   1 year ago Primary hypertension   [facility] [place], Dionne Bucy, MD   1 year ago Hypertension, unspecified type   [place], Kelby Aline, FNP   2 years ago Medicare annual wellness visit, subsequent   [place], Clearnce Sorrel, [place]       Future Appointments             In 1 month Rollene Rotunda Jaci Standard, [place], [facility]   In 2 months [name], MD [place] [facility] [place]            Passed - Lipid Panel completed within the last 12 months    Cholesterol, Total  Date Value Ref Range Status  05/08/2021 163 100 - 199 mg/dL Final   LDL Chol Calc (NIH)  Date Value Ref Range  Status  05/08/2021 95 0 - 99 mg/dL Final   HDL  Date Value Ref Range Status  05/08/2021 56 >39 mg/dL Final   Triglycerides  Date Value Ref Range Status  05/08/2021 60 0 - 149 mg/dL Final         Passed - CMP completed in the last 12 months    Albumin  Date Value Ref Range Status  11/20/2021 4.4 3.8 - 4.8 g/dL Final   Alkaline Phosphatase  Date Value Ref Range Status  11/20/2021 91 44 - 121 IU/L Final   ALT  Date Value Ref Range Status  11/20/2021 11 0 - 44 IU/L Final   AST  Date Value Ref Range Status  11/20/2021 18 0 - 40 IU/L Final   BUN  Date Value Ref Range Status  11/20/2021 15 8 - 27 mg/dL Final   Calcium  Date Value Ref Range Status  11/20/2021 10.1 8.6 -  10.2 mg/dL Final   CO2  Date Value Ref Range Status  11/20/2021 24 20 - 29 mmol/L Final   Creatinine, Ser  Date Value Ref Range Status  11/20/2021 1.09 0.76 - 1.27 mg/dL Final   Glucose  Date Value Ref Range Status  11/20/2021 94 70 - 99 mg/dL Final   Glucose, Bld  Date Value Ref Range Status  12/05/2015 93 65 - 99 mg/dL Final   Potassium  Date Value Ref Range Status  11/20/2021 4.0 3.5 - 5.2 mmol/L Final   Sodium  Date Value Ref Range Status  11/20/2021 142 134 - 144 mmol/L Final   Bilirubin Total  Date Value Ref Range Status  11/20/2021 1.4 (H) 0.0 - 1.2 mg/dL Final   Protein, ur  Date Value Ref Range Status  10/09/2015 >500 (A) NEGATIVE mg/dL Final   Protein, UA  Date Value Ref Range Status  12/19/2015 CANCELED      Comment:    Test not performed  Result canceled by the ancillary    Total Protein  Date Value Ref Range Status  11/20/2021 7.4 6.0 - 8.5 g/dL Final   GFR calc Af Amer  Date Value Ref Range Status  02/21/2020 60 >59 mL/min/1.73 Final    Comment:    **In accordance with recommendations from the NKF-ASN Task force,**   [facility] is in the process of updating its eGFR calculation to the   2021 CKD-EPI creatinine equation that estimates kidney function   without  a race variable.    eGFR  Date Value Ref Range Status  11/20/2021 69 >59 mL/min/1.73 Final   GFR calc non Af Amer  Date Value Ref Range Status  02/21/2020 52 (L) >59 mL/min/1.73 Final

## 2022-05-26 ENCOUNTER — Encounter: Payer: Medicare Other | Admitting: Family Medicine

## 2022-06-11 ENCOUNTER — Ambulatory Visit: Payer: 59 | Admitting: Urology

## 2022-06-15 ENCOUNTER — Ambulatory Visit: Payer: Medicare Other | Admitting: Urology

## 2022-06-18 ENCOUNTER — Ambulatory Visit: Payer: Medicare Other | Admitting: Urology

## 2022-06-22 ENCOUNTER — Ambulatory Visit
Admission: RE | Admit: 2022-06-22 | Discharge: 2022-06-22 | Disposition: A | Discharge status: Home / Self Care | Payer: 59 | Source: Ambulatory Visit | Attending: Urology | Admitting: Urology

## 2022-06-22 ENCOUNTER — Encounter: Payer: Medicare Other | Admitting: Family Medicine

## 2022-06-22 DIAGNOSIS — N281 Cyst of kidney, acquired: Secondary | ICD-10-CM | POA: Insufficient documentation

## 2022-06-30 ENCOUNTER — Other Ambulatory Visit: Payer: Self-pay | Admitting: Family Medicine

## 2022-06-30 DIAGNOSIS — I10 Essential (primary) hypertension: Secondary | ICD-10-CM

## 2022-06-30 DIAGNOSIS — N401 Enlarged prostate with lower urinary tract symptoms: Secondary | ICD-10-CM

## 2022-06-30 NOTE — Telephone Encounter (Signed)
Requested medication (s) are due for refill today:   Yes for all 3  Requested medication (s) are on the active medication list:   Yes for all 3  Future visit scheduled:   Yes 07/06/2022   Last ordered: 05/08/2021 #90, 2 refills for all 3.  Returned because PSA is due per protocol.       Requested Prescriptions  Pending Prescriptions Disp Refills   amLODipine (NORVASC) 10 MG tablet [Pharmacy Med Name: AMLODIPINE BESYLATE '10MG'$  TABLETS] 90 tablet 3    Sig: TAKE 1 TABLET(10 MG) BY MOUTH DAILY     Cardiovascular: Calcium Channel Blockers 2 Failed - 06/30/2022  6:29 AM      Failed - Valid encounter within last 6 months    Recent Outpatient Visits           7 months ago Essential hypertension   [place] Tally Joe T, FNP   1 year ago Annual physical exam   [facility] Tally Joe T, [place]   1 year ago Primary hypertension   [place], Dionne Bucy, [place]   1 year ago Hypertension, unspecified type   [place], Kelby Aline, [place]   2 years ago Medicare annual wellness visit, subsequent   [place], Clearnce Sorrel, [place]       Future Appointments             In 6 days [name], [place], [place]   In 2 weeks [name], MD [place] BP in normal range    BP Readings from Last 1 Encounters:  12/16/21 138/70         Passed - Last Heart Rate in normal range    Pulse Readings from Last 1 Encounters:  12/16/21 (!) 54          finasteride (PROSCAR) 5 MG tablet [Pharmacy Med Name: FINASTERIDE '5MG'$  TABLETS] 90 tablet 3    Sig: TAKE 1 TABLET(5 MG) BY MOUTH DAILY     Urology: 5-alpha Reductase Inhibitors Failed - 06/30/2022  6:29 AM      Failed - PSA in normal range and within 360 days    PSA  Date Value Ref Range Status  10/09/2015  19.09 (H) 0.00 - 4.00 ng/mL Final    Comment:    (NOTE) While PSA levels of <=4.0 ng/ml are reported as reference range, some men with levels below 4.0 ng/ml can have prostate cancer and many men with PSA above 4.0 ng/ml do not have prostate cancer.  Other tests such as free PSA, age specific reference ranges, PSA velocity and PSA doubling time may be helpful especially in men less than 29 years old. Performed at [facility]    Prostate Specific Ag, Serum  Date Value Ref Range Status  05/08/2021 1.4 0.0 - 4.0 ng/mL Final    Comment:    Roche ECLIA methodology. According to the American Urological Association, Serum PSA should decrease and remain at undetectable levels after radical prostatectomy. The AUA defines biochemical recurrence as an initial PSA value 0.2 ng/mL or greater followed by a subsequent confirmatory PSA value 0.2 ng/mL or greater. Values obtained with different assay methods or kits cannot be used interchangeably. Results cannot be interpreted as absolute evidence of the presence or absence of malignant disease.  Passed - Valid encounter within last 12 months    Recent Outpatient Visits           7 months ago Essential hypertension   [place] Tally Joe T, FNP   1 year ago Annual physical exam   [facility] [name], [place]   1 year ago Primary hypertension   [place], Dionne Bucy, MD   1 year ago Hypertension, unspecified type   [place] Flinchum, Kelby Aline, [place]   2 years ago Medicare annual wellness visit, subsequent   [facility] [place], Clearnce Sorrel, [place]       Future Appointments             In 6 days [name], [place], [place]   In 2 weeks Diamantina [place], Herbert Seta, MD [place] [facility] [place]             atenolol (TENORMIN) 50 MG  tablet [Pharmacy Med Name: ATENOLOL '50MG'$  TABLETS] 90 tablet 3    Sig: TAKE 1 TABLET(50 MG) BY MOUTH DAILY     Cardiovascular: Beta Blockers 2 Failed - 06/30/2022  6:29 AM      Failed - Valid encounter within last 6 months    Recent Outpatient Visits           7 months ago Essential hypertension   [place] [name], FNP   1 year ago Annual physical exam   [facility] [name], [place]   1 year ago Primary hypertension   [place], Dionne Bucy, MD   1 year ago Hypertension, unspecified type   [place] Flinchum, Kelby Aline, [place]   2 years ago Medicare annual wellness visit, subsequent   [facility] [place], Clearnce Sorrel, [place]       Future Appointments             In 6 days [name], [place], [place]   In 2 weeks [name], MD [facility]             Passed - Cr in normal range and within 360 days    Creatinine, Ser  Date Value Ref Range Status  11/20/2021 1.09 0.76 - 1.27 mg/dL Final         Passed - Last BP in normal range    BP Readings from Last 1 Encounters:  12/16/21 138/70         Passed - Last Heart Rate in normal range    Pulse Readings from Last 1 Encounters:  12/16/21 (!) 54

## 2022-07-06 ENCOUNTER — Encounter: Payer: 59 | Admitting: Family Medicine

## 2022-07-16 ENCOUNTER — Encounter: Payer: Self-pay | Admitting: Urology

## 2022-07-16 ENCOUNTER — Other Ambulatory Visit: Payer: Self-pay | Admitting: Family Medicine

## 2022-07-16 ENCOUNTER — Ambulatory Visit (INDEPENDENT_AMBULATORY_CARE_PROVIDER_SITE_OTHER): Payer: 59 | Admitting: Urology

## 2022-07-16 VITALS — BP 133/75 | HR 65 | Ht 70.0 in | Wt 219.3 lb

## 2022-07-16 DIAGNOSIS — N281 Cyst of kidney, acquired: Secondary | ICD-10-CM | POA: Diagnosis not present

## 2022-07-16 DIAGNOSIS — N401 Enlarged prostate with lower urinary tract symptoms: Secondary | ICD-10-CM

## 2022-07-16 DIAGNOSIS — G4701 Insomnia due to medical condition: Secondary | ICD-10-CM

## 2022-07-16 NOTE — Progress Notes (Signed)
   07/16/2022 10:27 AM   Aaron Stokes [birth year] [id number]  Reason for visit: Follow up left renal cyst, CKD, history of BPH status post HOLEP  HPI: 82 year old male followed extensively in our clinic previously.  I saw him last in April 2023.  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 Erlene Quan in 2017, and has been voiding well since that time.  His PCP started finasteride at some point for unclear reasons, but he continues to deny any urinary complaints.  No hematuria or dysuria.  He had a renal ultrasound for CKD in March 2023 that showed a minimally complex left 2 cm renal cyst and he was referred back to urology. 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  I recommended a follow-up ultrasound in 9 months with his age and comorbidities.  I personally viewed and interpreted those images dated 06/22/2022 that shows a 90 g prostate, no hydronephrosis, benign 2 cm left renal cyst and no follow-up imaging required.  Reassurance was provided regarding these findings.  Okay to continue finasteride from PCP Can follow-up with urology as needed->no further imaging needed regarding benign left renal cyst  [name], [place] [address] [place], [place] [address] [phone]

## 2022-07-16 NOTE — Telephone Encounter (Signed)
Requested medications are due for refill today.  unsure  Requested medications are on the active medications list.  yes  Last refill. 12/19/2021 #90 1 rf  Future visit scheduled.   yes  Notes to clinic.  Refill not delegated.    Requested Prescriptions  Pending Prescriptions Disp Refills   temazepam (RESTORIL) 15 MG capsule [Pharmacy Med Name: TEMAZEPAM 15MG  CAPSULES] 90 capsule     Sig: TAKE 1 CAPSULE(15 MG) BY MOUTH AT BEDTIME AS NEEDED FOR SLEEP     Not Delegated - Psychiatry: Anxiolytics/Hypnotics 2 Failed - 07/16/2022 11:08 AM      Failed - This refill cannot be delegated      Failed - Urine Drug Screen completed in last 360 days      Failed - Valid encounter within last 6 months    Recent Outpatient Visits           7 months ago Essential hypertension   [place] [name], FNP   1 year ago Annual physical exam   [facility] [name], [place]   1 year ago Primary hypertension   [place], Dionne Bucy, [place]   1 year ago Hypertension, unspecified type   [place] Flinchum, Kelby Aline, [place]   2 years ago Medicare annual wellness visit, subsequent   [place], Clearnce Sorrel, [place]       Future Appointments             In 6 days [name], [place], [place] - Patient is not pregnant

## 2022-07-17 ENCOUNTER — Other Ambulatory Visit: Payer: Self-pay

## 2022-07-17 DIAGNOSIS — G4701 Insomnia due to medical condition: Secondary | ICD-10-CM

## 2022-07-17 MED ORDER — TEMAZEPAM 15 MG PO CAPS: ORAL_CAPSULE | ORAL | 0 refills | Status: DC

## 2022-07-22 ENCOUNTER — Encounter: Payer: Self-pay | Admitting: Family Medicine

## 2022-07-22 ENCOUNTER — Ambulatory Visit (INDEPENDENT_AMBULATORY_CARE_PROVIDER_SITE_OTHER): Payer: 59 | Admitting: Family Medicine

## 2022-07-22 VITALS — BP 131/70 | HR 50 | Temp 97.6°F | Resp 12 | Ht 66.0 in | Wt 222.0 lb

## 2022-07-22 DIAGNOSIS — R338 Other retention of urine: Secondary | ICD-10-CM

## 2022-07-22 DIAGNOSIS — G4701 Insomnia due to medical condition: Secondary | ICD-10-CM

## 2022-07-22 DIAGNOSIS — Z0000 Encounter for general adult medical examination without abnormal findings: Secondary | ICD-10-CM | POA: Diagnosis not present

## 2022-07-22 DIAGNOSIS — E030 Congenital hypothyroidism with diffuse goiter: Secondary | ICD-10-CM | POA: Diagnosis not present

## 2022-07-22 DIAGNOSIS — N1831 Chronic kidney disease, stage 3a: Secondary | ICD-10-CM | POA: Diagnosis not present

## 2022-07-22 DIAGNOSIS — I739 Peripheral vascular disease, unspecified: Secondary | ICD-10-CM

## 2022-07-22 DIAGNOSIS — I10 Essential (primary) hypertension: Secondary | ICD-10-CM

## 2022-07-22 DIAGNOSIS — F209 Schizophrenia, unspecified: Secondary | ICD-10-CM

## 2022-07-22 DIAGNOSIS — N401 Enlarged prostate with lower urinary tract symptoms: Secondary | ICD-10-CM

## 2022-07-22 MED ORDER — TEMAZEPAM 15 MG PO CAPS: ORAL_CAPSULE | ORAL | 3 refills | Status: DC

## 2022-07-22 MED ORDER — ASPIRIN 81 MG PO TBEC: DELAYED_RELEASE_TABLET | ORAL | 3 refills | Status: DC

## 2022-07-22 NOTE — Progress Notes (Signed)
I,Sulibeya S Dimas,acting as a Education administrator for Aaron Sprout, FNP.,have documented all relevant documentation on the behalf of Aaron Sprout, FNP,as directed by  Aaron Sprout, FNP while in the presence of Aaron Sprout, FNP.   Complete physical exam   Patient: Aaron Stokes   DOB: 1940/05/12   82 y.o. Male  MRN: [id number] Visit Date: 07/22/2022  Today's healthcare provider: [name], FNP  Re Introduced to nurse practitioner role and practice setting.  All questions answered.  Discussed provider/patient relationship and expectations.  Chief Complaint  Patient presents with   Annual Exam   Subjective    Aaron Stokes is a 82 y.o. male who presents today for a complete physical exam.  He reports consuming a general diet. Home exercise routine includes stretching. He generally feels fairly well. He reports sleeping well. He does not have additional problems to discuss today.  HPI  11/06/21 AWV  Past Medical History:  Diagnosis Date   Acid reflux    Anxiety    Chronic kidney disease    UTI; hematuria   Hypertension    Insomnia    Mentally challenged    Past Surgical History:  Procedure Laterality Date   CATARACT EXTRACTION W/PHACO Left 12/16/2021   Procedure: CATARACT EXTRACTION PHACO  LEFT MALYUGIN RING 6.47 00:50.5 ;  Surgeon: Birder Robson, MD;  Location: [facility];  Service: Ophthalmology;  Laterality: Left;   CIRCUMCISION, NON-NEWBORN     CYSTOSCOPY WITH STENT PLACEMENT Right 08/21/2015   Procedure: cystoscopy;  Surgeon: Nickie Retort, MD;  Location: [facility];  Service: Urology;  Laterality: Right;   CYSTOSCOPY WITH STENT PLACEMENT Right 12/11/2015   Procedure: CYSTOSCOPY WITH STENT PLACEMENT;  Surgeon: Nickie Retort, MD;  Location: [facility];  Service: Urology;  Laterality: Right;   HOLEP-LASER ENUCLEATION OF THE PROSTATE WITH MORCELLATION N/A 10/23/2015   Procedure: HOLEP-LASER ENUCLEATION OF THE PROSTATE WITH MORCELLATION;  Surgeon: Hollice Espy, MD;  Location: [facility];  Service: Urology;  Laterality: N/A;   TRANSURETHRAL RESECTION OF BLADDER TUMOR  12/11/2015   Procedure: TRANSURETHRAL RESECTION OF BLADDER TUMOR (TURBT);  Surgeon: Nickie Retort, MD;  Location: [facility];  Service: Urology;;   URETEROSCOPY WITH HOLMIUM LASER LITHOTRIPSY Right 12/11/2015   Procedure: URETEROSCOPY with biopsy;  Surgeon: Nickie Retort, MD;  Location: [facility];  Service: Urology;  Laterality: Right;   Social History   Socioeconomic History   Marital status: Divorced    Spouse name: Not on file   Number of children: 0   Years of education: Not on file   Highest education level: 12th grade  Occupational History   Occupation: retired  Tobacco Use   Smoking status: Never   Smokeless tobacco: Never  Vaping Use   Vaping Use: Never used  Substance and Sexual Activity   Alcohol use: No    Alcohol/week: 0.0 standard drinks of alcohol   Drug use: No   Sexual activity: Not Currently  Other Topics Concern   Not on file  Social History Narrative   Not on file   Social Determinants of Health   Financial Resource Strain: Low Risk  (11/06/2021)   Overall Financial Resource Strain (CARDIA)    Difficulty of Paying Living Expenses: Not hard at all  Food Insecurity: No Food Insecurity (11/06/2021)   Hunger Vital Sign    Worried About Running Out of Food in the Last Year: Never true    [place] in the  Last Year: Never true  Transportation Needs: No Transportation Needs (11/06/2021)   PRAPARE - Hydrologist (Medical): No    Lack of Transportation (Non-Medical): No  Physical Activity: Insufficiently Active (11/06/2021)   Exercise Vital Sign    Days of Exercise per Week: 1 day    Minutes of Exercise per Session: 30 min  Stress: No Stress Concern Present (11/06/2021)   [place]    Feeling of Stress : Not at all  Social Connections: Socially  Isolated (11/06/2021)   Social Connection and Isolation Panel [NHANES]    Frequency of Communication with Friends and Family: Never    Frequency of Social Gatherings with Friends and Family: Once a week    Attends Religious Services: Never    Marine scientist or Organizations: No    Attends Archivist Meetings: Never    Marital Status: Divorced  Human resources officer Violence: Not At Risk (11/06/2021)   Humiliation, Afraid, Rape, and Kick questionnaire    Fear of Current or Ex-Partner: No    Emotionally Abused: No    Physically Abused: No    Sexually Abused: No   Family Status  Relation Name Status   Father  Deceased   Mother  Deceased   PGM  (Not Specified)   Brother  Alive   Sister  Alive   Brother  Alive   Sister  Alive   Sister  Alive   Neg Hx  (Not Specified)   Family History  Problem Relation Age of Onset   Bladder Cancer Father    Cancer Father    Bladder Cancer Paternal Grandmother    Cancer Brother    Prostate cancer Neg Hx    No Known Allergies  Patient Care Team: Aaron Sprout, FNP as PCP - General (Family Medicine)   Medications: Outpatient Medications Prior to Visit  Medication Sig   amLODipine (NORVASC) 10 MG tablet TAKE 1 TABLET(10 MG) BY MOUTH DAILY   atenolol (TENORMIN) 50 MG tablet TAKE 1 TABLET(50 MG) BY MOUTH DAILY   finasteride (PROSCAR) 5 MG tablet TAKE 1 TABLET(5 MG) BY MOUTH DAILY   levothyroxine (SYNTHROID) 75 MCG tablet TAKE 1 TABLET(75 MCG) BY MOUTH DAILY. STOP 100 MCG   perphenazine-amitriptyline (ETRAFON/TRIAVIL) 4-25 MG TABS tablet TAKE 1 TABLET BY MOUTH AT BEDTIME   [DISCONTINUED] ASPIRIN LOW DOSE 81 MG tablet TAKE 1 TABLET(81 MG) BY MOUTH DAILY   [DISCONTINUED] temazepam (RESTORIL) 15 MG capsule TAKE 1 CAPSULE(15 MG) BY MOUTH AT BEDTIME AS NEEDED FOR SLEEP   No facility-administered medications prior to visit.    Review of Systems  All other systems reviewed and are negative.   Last CBC Lab Results  Component Value  Date   WBC 6.1 08/05/2020   HGB 13.4 08/05/2020   HCT 39.8 08/05/2020   MCV 91 08/05/2020   MCH 30.5 08/05/2020   RDW 12.0 08/05/2020   PLT 221 0000000   Last metabolic panel Lab Results  Component Value Date   GLUCOSE 94 11/20/2021   NA 142 11/20/2021   K 4.0 11/20/2021   CL 104 11/20/2021   CO2 24 11/20/2021   BUN 15 11/20/2021   CREATININE 1.09 11/20/2021   EGFR 69 11/20/2021   CALCIUM 10.1 11/20/2021   PHOS 2.6 (L) 11/05/2020   PROT 7.4 11/20/2021   ALBUMIN 4.4 11/20/2021   LABGLOB 3.0 11/20/2021   AGRATIO 1.5 11/20/2021   BILITOT 1.4 (H) 11/20/2021  ALKPHOS 91 11/20/2021   AST 18 11/20/2021   ALT 11 11/20/2021   ANIONGAP 2 (L) 12/05/2015   Last lipids Lab Results  Component Value Date   CHOL 163 05/08/2021   HDL 56 05/08/2021   LDLCALC 95 05/08/2021   TRIG 60 05/08/2021   CHOLHDL 2.9 05/08/2021   Last hemoglobin A1c Lab Results  Component Value Date   HGBA1C 4.8 09/06/2017   Last thyroid functions Lab Results  Component Value Date   TSH 0.616 01/05/2022      Objective    BP 131/70 (BP Location: Right Arm, Patient Position: Sitting, Cuff Size: Large)   Pulse (!) 50   Temp 97.6 F (36.4 C) (Temporal)   Resp 12   Ht 5\' 6"  (1.676 m)   Wt 222 lb (100.7 kg)   SpO2 100%   BMI 35.83 kg/m  BP Readings from Last 3 Encounters:  07/22/22 131/70  07/16/22 133/75  12/16/21 138/70   Wt Readings from Last 3 Encounters:  07/22/22 222 lb (100.7 kg)  07/16/22 219 lb 4.8 oz (99.5 kg)  12/16/21 217 lb (98.4 kg)       Physical Exam Vitals and nursing note reviewed.  Constitutional:      General: He is awake. He is not in acute distress.    Appearance: Normal appearance. He is well-developed and well-groomed. He is obese. He is not ill-appearing, toxic-appearing or diaphoretic.  HENT:     Head: Normocephalic and atraumatic.     Jaw: There is normal jaw occlusion. No trismus, tenderness, swelling or pain on movement.     Salivary Glands: Right  salivary gland is not diffusely enlarged or tender. Left salivary gland is not diffusely enlarged or tender.     Right Ear: Hearing, tympanic membrane, ear canal and external ear normal. There is no impacted cerumen.     Left Ear: Hearing, tympanic membrane, ear canal and external ear normal. There is no impacted cerumen.     Nose: Nose normal. No congestion or rhinorrhea.     Right Turbinates: Not enlarged, swollen or pale.     Left Turbinates: Not enlarged, swollen or pale.     Right Sinus: No maxillary sinus tenderness or frontal sinus tenderness.     Left Sinus: No maxillary sinus tenderness or frontal sinus tenderness.     Mouth/Throat:     Lips: Pink.     Mouth: Mucous membranes are moist. No injury, lacerations, oral lesions or angioedema.     Pharynx: Oropharynx is clear. Uvula midline. No pharyngeal swelling, oropharyngeal exudate or posterior oropharyngeal erythema.     Tonsils: No tonsillar exudate or tonsillar abscesses.  Eyes:     General: Lids are normal. Vision grossly intact. Gaze aligned appropriately.        Right eye: No discharge.        Left eye: No discharge.     Extraocular Movements: Extraocular movements intact.     Conjunctiva/sclera: Conjunctivae normal.     Pupils: Pupils are equal, round, and reactive to light.  Neck:     Thyroid: No thyroid mass, thyromegaly or thyroid tenderness.     Vascular: No carotid bruit.     Trachea: Trachea normal. No tracheal tenderness.  Cardiovascular:     Rate and Rhythm: Normal rate and regular rhythm.     Pulses: Normal pulses.          Carotid pulses are 2+ on the right side and 2+ on the left side.  Radial pulses are 2+ on the right side and 2+ on the left side.       Femoral pulses are 2+ on the right side and 2+ on the left side.      Popliteal pulses are 2+ on the right side and 2+ on the left side.       Dorsalis pedis pulses are 2+ on the right side and 2+ on the left side.       Posterior tibial pulses are 2+ on  the right side and 2+ on the left side.     Heart sounds: Normal heart sounds, S1 normal and S2 normal. No murmur heard.    No friction rub. No gallop.  Pulmonary:     Effort: Pulmonary effort is normal. No respiratory distress.     Breath sounds: Normal breath sounds and air entry. No stridor. No wheezing, rhonchi or rales.  Chest:     Chest wall: No tenderness.  Abdominal:     General: Abdomen is flat. Bowel sounds are normal. There is no distension.     Palpations: Abdomen is soft. There is no mass.     Tenderness: There is no abdominal tenderness. There is no guarding or rebound.     Hernia: No hernia is present.  Genitourinary:    Comments: Exam deferred; denies complaints Musculoskeletal:        General: No swelling, tenderness, deformity or signs of injury. Normal range of motion.     Cervical back: Normal range of motion and neck supple. No rigidity or tenderness.     Right lower leg: No edema.     Left lower leg: No edema.  Lymphadenopathy:     Cervical: No cervical adenopathy.     Right cervical: No superficial, deep or posterior cervical adenopathy.    Left cervical: No superficial, deep or posterior cervical adenopathy.  Skin:    General: Skin is warm and dry.     Capillary Refill: Capillary refill takes less than 2 seconds.     Coloration: Skin is not jaundiced or pale.     Findings: No bruising, erythema, lesion or rash.  Neurological:     General: No focal deficit present.     Mental Status: He is alert and oriented to person, place, and time. Mental status is at baseline.     GCS: GCS eye subscore is 4. GCS verbal subscore is 5. GCS motor subscore is 6.     Sensory: Sensation is intact. No sensory deficit.     Motor: Motor function is intact. No weakness.     Coordination: Coordination is intact.     Gait: Gait is intact.  Psychiatric:        Attention and Perception: Attention and perception normal.        Mood and Affect: Mood and affect normal.         Speech: Speech normal.        Behavior: Behavior normal. Behavior is cooperative.        Thought Content: Thought content normal.        Cognition and Memory: Cognition normal.        Judgment: Judgment normal.     Last depression screening scores    07/22/2022   10:14 AM 11/20/2021   11:17 AM 11/06/2021   10:29 AM  PHQ 2/9 Scores  PHQ - 2 Score 0 0 0  PHQ- 9 Score 1 0 0   Last fall risk screening    07/22/2022  10:13 AM  Fall Risk   Falls in the past year? 0  Number falls in past yr: 0  Injury with Fall? 0  Risk for fall due to : No Fall Risks  Follow up Falls evaluation completed   Last Audit-C alcohol use screening    07/22/2022   10:14 AM  Alcohol Use Disorder Test (AUDIT)  1. How often do you have a drink containing alcohol? 0  2. How many drinks containing alcohol do you have on a typical day when you are drinking? 0  3. How often do you have six or more drinks on one occasion? 0  AUDIT-C Score 0   A score of 3 or more in women, and 4 or more in men indicates increased risk for alcohol abuse, EXCEPT if all of the points are from question 1   No results found for any visits on 07/22/22.  Assessment & Plan    Routine Health Maintenance and Physical Exam  Exercise Activities and Dietary recommendations  Goals      DIET - EAT MORE FRUITS AND VEGETABLES     DIET - INCREASE WATER INTAKE     Recommend increasing water intake to 6 glasses a day.         Immunization History  Administered Date(s) Administered   Fluad Quad(high Dose 65+) 01/05/2020   Influenza, High Dose Seasonal PF 02/21/2019   Influenza-Unspecified 02/04/2015, 02/19/2017, 02/18/2018   PFIZER(Purple Top)SARS-COV-2 Vaccination 07/16/2019, 08/08/2019, 03/22/2020   Pneumococcal Conjugate-13 01/05/2020   Pneumococcal Polysaccharide-23 11/10/2018   Zoster Recombinat (Shingrix) 07/21/2016    Health Maintenance  Topic Date Due   DTaP/Tdap/Td (1 - Tdap) Never done   Zoster Vaccines- Shingrix (2 of  2) 09/15/2016   COVID-19 Vaccine (4 - 2023-24 season) 12/19/2021   Medicare Annual Wellness (AWV)  11/07/2022   INFLUENZA VACCINE  11/19/2022   Pneumonia Vaccine 108+ Years old  Completed   HPV VACCINES  Aged Out    Discussed health benefits of physical activity, and encouraged him to engage in regular exercise appropriate for his age and condition.  Problem List Items Addressed This Visit       Cardiovascular and Mediastinum   Essential hypertension    Chronic, stable Goal <140/<90 Repeat CBC and CMP Previously on norvasc 10 mg and atenolol 50 mg; no dose adjustments at this time      Relevant Medications   aspirin EC (ASPIRIN LOW DOSE) 81 MG tablet   Other Relevant Orders   Comprehensive Metabolic Panel (CMET)   CBC with Differential/Platelet   RESOLVED: PAD (peripheral artery disease)   Relevant Medications   aspirin EC (ASPIRIN LOW DOSE) 81 MG tablet   Other Relevant Orders   Lipid panel     Endocrine   Hypothyroidism    Chronic, stable Previously on 75 mcg synthroid Repeat labs Patient without complaints at this time       Relevant Orders   TSH + free T4     Genitourinary   Benign prostatic hyperplasia with urinary retention    Chronic, stable Repeat PSA Remains on proscar 5 mg daily      Relevant Orders   PSA   Stage 3a chronic kidney disease    Chronic, previously improved Repeat CMP Continue to monitor use of anti-hypertensive, diet, and OTC NSAID/ETOH use      Relevant Orders   Comprehensive Metabolic Panel (CMET)     Other   Annual physical exam - Primary    UTD on vision and  dental Denies concerns with mobility or hearing Remains independent at home; able to perform all adls Denies falls Things to do to keep yourself healthy  - Exercise at least 30-45 minutes a day, 3-4 days a week.  - Eat a low-fat diet with lots of fruits and vegetables, up to 7-9 servings per day.  - Seatbelts can save your life. Wear them always.  - Smoke detectors  on every level of your home, check batteries every year.  - Eye Doctor - have an eye exam every 1-2 years  - Safe sex - if you may be exposed to STDs, use a condom.  - Alcohol -  If you drink, do it moderately, less than 2 drinks per day.  - [place]. Choose someone to speak for you if you are not able.  - Depression is common in our stressful world.If you're feeling down or losing interest in things you normally enjoy, please come in for a visit.  - Violence - If anyone is threatening or hurting you, please call immediately.       Relevant Orders   Comprehensive Metabolic Panel (CMET)   CBC with Differential/Platelet   TSH + free T4   Hemoglobin A1c   PSA   Lipid panel   Insomnia due to medical condition    Chronic, stable Continue Restoril 15 mg to assist      Relevant Medications   temazepam (RESTORIL) 15 MG capsule   Schizophrenia    Chronic, stable Without s/s of mania or altered thought processes Maintained of etrafon-triavil 4-25      No follow-ups on file.    Vonna Kotyk, FNP, have reviewed all documentation for this visit. The documentation on 07/22/22 for the exam, diagnosis, procedures, and orders are all accurate and complete.  Aaron Stokes, [place] [phone] (phone) [phone] (fax)  [place]

## 2022-07-22 NOTE — Assessment & Plan Note (Signed)
Chronic, previously improved Repeat CMP Continue to monitor use of anti-hypertensive, diet, and OTC NSAID/ETOH use

## 2022-07-22 NOTE — Assessment & Plan Note (Signed)
UTD on vision and dental Denies concerns with mobility or hearing Remains independent at home; able to perform all adls Denies falls Things to do to keep yourself healthy  - Exercise at least 30-45 minutes a day, 3-4 days a week.  - Eat a low-fat diet with lots of fruits and vegetables, up to 7-9 servings per day.  - Seatbelts can save your life. Wear them always.  - Smoke detectors on every level of your home, check batteries every year.  - Eye Doctor - have an eye exam every 1-2 years  - Safe sex - if you may be exposed to STDs, use a condom.  - Alcohol -  If you drink, do it moderately, less than 2 drinks per day.  - [place]. Choose someone to speak for you if you are not able.  - Depression is common in our stressful world.If you're feeling down or losing interest in things you normally enjoy, please come in for a visit.  - Violence - If anyone is threatening or hurting you, please call immediately.

## 2022-07-22 NOTE — Assessment & Plan Note (Signed)
Chronic, stable Goal <140/<90 Repeat CBC and CMP Previously on norvasc 10 mg and atenolol 50 mg; no dose adjustments at this time

## 2022-07-22 NOTE — Assessment & Plan Note (Signed)
Chronic, stable Continue Restoril 15 mg to assist

## 2022-07-22 NOTE — Assessment & Plan Note (Signed)
Chronic, stable Without s/s of mania or altered thought processes Maintained of etrafon-triavil 4-25

## 2022-07-22 NOTE — Assessment & Plan Note (Signed)
Chronic, stable Previously on 75 mcg synthroid Repeat labs Patient without complaints at this time

## 2022-07-22 NOTE — Assessment & Plan Note (Signed)
Chronic, stable Repeat PSA Remains on proscar 5 mg daily

## 2022-07-23 LAB — COMPREHENSIVE METABOLIC PANEL
ALT: 14 IU/L (ref 0–44)
AST: 24 IU/L (ref 0–40)
Albumin/Globulin Ratio: 1.3 (ref 1.2–2.2)
Albumin: 4 g/dL (ref 3.7–4.7)
Alkaline Phosphatase: 85 IU/L (ref 44–121)
BUN/Creatinine Ratio: 14 (ref 10–24)
BUN: 19 mg/dL (ref 8–27)
Bilirubin Total: 1.1 mg/dL (ref 0.0–1.2)
CO2: 24 mmol/L (ref 20–29)
Calcium: 10.6 mg/dL — ABNORMAL HIGH (ref 8.6–10.2)
Chloride: 105 mmol/L (ref 96–106)
Creatinine, Ser: 1.36 mg/dL — ABNORMAL HIGH (ref 0.76–1.27)
Globulin, Total: 3 g/dL (ref 1.5–4.5)
Glucose: 90 mg/dL (ref 70–99)
Potassium: 4.5 mmol/L (ref 3.5–5.2)
Sodium: 142 mmol/L (ref 134–144)
Total Protein: 7 g/dL (ref 6.0–8.5)
eGFR: 52 mL/min/{1.73_m2} — ABNORMAL LOW (ref >59)

## 2022-07-23 LAB — CBC WITH DIFFERENTIAL/PLATELET
Basophils Absolute: 0 10*3/uL (ref 0.0–0.2)
Basos: 1 %
EOS (ABSOLUTE): 0.1 10*3/uL (ref 0.0–0.4)
Eos: 1 %
Hematocrit: 38.6 % (ref 37.5–51.0)
Hemoglobin: 12.6 g/dL — ABNORMAL LOW (ref 13.0–17.7)
Immature Grans (Abs): 0 10*3/uL (ref 0.0–0.1)
Immature Granulocytes: 1 %
Lymphocytes Absolute: 2.1 10*3/uL (ref 0.7–3.1)
Lymphs: 34 %
MCH: 31.1 pg (ref 26.6–33.0)
MCHC: 32.6 g/dL (ref 31.5–35.7)
MCV: 95 fL (ref 79–97)
Monocytes Absolute: 0.7 10*3/uL (ref 0.1–0.9)
Monocytes: 11 %
Neutrophils Absolute: 3.3 10*3/uL (ref 1.4–7.0)
Neutrophils: 52 %
Platelets: 192 10*3/uL (ref 150–450)
RBC: 4.05 x10E6/uL — ABNORMAL LOW (ref 4.14–5.80)
RDW: 11.8 % (ref 11.6–15.4)
WBC: 6.2 10*3/uL (ref 3.4–10.8)

## 2022-07-23 LAB — LIPID PANEL
Chol/HDL Ratio: 2.9 ratio (ref 0.0–5.0)
Cholesterol, Total: 172 mg/dL (ref 100–199)
HDL: 60 mg/dL (ref >39)
LDL Chol Calc (NIH): 99 mg/dL (ref 0–99)
Triglycerides: 70 mg/dL (ref 0–149)
VLDL Cholesterol Cal: 13 mg/dL (ref 5–40)

## 2022-07-23 LAB — TSH+FREE T4
Free T4: 1.18 ng/dL (ref 0.82–1.77)
TSH: 3.65 u[IU]/mL (ref 0.450–4.500)

## 2022-07-23 LAB — HEMOGLOBIN A1C
Est. average glucose Bld gHb Est-mCnc: 100 mg/dL
Hgb A1c MFr Bld: 5.1 % (ref 4.8–5.6)

## 2022-07-23 LAB — PSA: Prostate Specific Ag, Serum: 1.2 ng/mL (ref 0.0–4.0)

## 2022-07-23 NOTE — Progress Notes (Signed)
Kidney function remains decreased again. Continue to monitor water intake and reduce use of NSAIDs or alcohol if applicable. Slight anemia returned as well. Continue to recommend iron rich, balanced meals to assist. Thyroid is stable. A1c is stable. PSA is stable.Cholesterol at goal. I continue to recommend diet low in saturated fat and regular exercise - 30 min at least 5 times per week.

## 2022-08-14 ENCOUNTER — Other Ambulatory Visit: Payer: Self-pay | Admitting: Family Medicine

## 2022-08-14 DIAGNOSIS — E039 Hypothyroidism, unspecified: Secondary | ICD-10-CM

## 2022-08-14 DIAGNOSIS — N401 Enlarged prostate with lower urinary tract symptoms: Secondary | ICD-10-CM

## 2022-08-14 DIAGNOSIS — F209 Schizophrenia, unspecified: Secondary | ICD-10-CM

## 2022-08-14 DIAGNOSIS — I10 Essential (primary) hypertension: Secondary | ICD-10-CM

## 2022-08-14 DIAGNOSIS — G4701 Insomnia due to medical condition: Secondary | ICD-10-CM

## 2022-08-14 MED ORDER — AMLODIPINE BESYLATE 10 MG PO TABS: ORAL_TABLET | ORAL | 3 refills | Status: DC

## 2022-08-14 MED ORDER — PERPHENAZINE-AMITRIPTYLINE 4-25 MG PO TABS: 1.0000 | ORAL_TABLET | Freq: Every day | ORAL | 3 refills | Status: DC

## 2022-08-14 MED ORDER — ATENOLOL 50 MG PO TABS: 50.0000 mg | ORAL_TABLET | Freq: Every day | ORAL | 3 refills | Status: DC

## 2022-08-14 MED ORDER — LEVOTHYROXINE SODIUM 75 MCG PO TABS: 75.0000 ug | ORAL_TABLET | Freq: Every day | ORAL | 3 refills | Status: DC

## 2022-08-14 MED ORDER — FINASTERIDE 5 MG PO TABS: 5.0000 mg | ORAL_TABLET | Freq: Every day | ORAL | 3 refills | Status: DC

## 2022-08-31 ENCOUNTER — Telehealth: Payer: Self-pay | Admitting: Family Medicine

## 2022-08-31 ENCOUNTER — Other Ambulatory Visit: Payer: Self-pay

## 2022-08-31 DIAGNOSIS — G4701 Insomnia due to medical condition: Secondary | ICD-10-CM

## 2022-08-31 MED ORDER — TEMAZEPAM 15 MG PO CAPS: ORAL_CAPSULE | ORAL | 3 refills | Status: DC

## 2022-08-31 NOTE — Telephone Encounter (Signed)
LOV 07/22/22 NOV none LRF 07/22/22 90 x 3

## 2022-08-31 NOTE — Telephone Encounter (Signed)
[facility] pharmacy faxed refill request for the following medications:   temazepam (RESTORIL) 15 MG capsule   Please advise

## 2022-09-16 ENCOUNTER — Other Ambulatory Visit: Payer: Self-pay | Admitting: Family Medicine

## 2022-09-16 DIAGNOSIS — I739 Peripheral vascular disease, unspecified: Secondary | ICD-10-CM

## 2022-09-24 ENCOUNTER — Telehealth: Payer: Self-pay | Admitting: Family Medicine

## 2022-09-25 NOTE — Telephone Encounter (Signed)
Opened in error

## 2022-09-28 ENCOUNTER — Telehealth: Payer: Self-pay | Admitting: Family Medicine

## 2022-09-28 ENCOUNTER — Other Ambulatory Visit: Payer: Self-pay | Admitting: *Deleted

## 2022-09-28 DIAGNOSIS — E039 Hypothyroidism, unspecified: Secondary | ICD-10-CM

## 2022-09-28 DIAGNOSIS — G4701 Insomnia due to medical condition: Secondary | ICD-10-CM

## 2022-09-28 MED ORDER — LEVOTHYROXINE SODIUM 75 MCG PO TABS: 75.0000 ug | ORAL_TABLET | Freq: Every day | ORAL | 3 refills | Status: DC

## 2022-11-20 ENCOUNTER — Other Ambulatory Visit: Payer: Self-pay | Admitting: Family Medicine

## 2022-11-20 DIAGNOSIS — G4701 Insomnia due to medical condition: Secondary | ICD-10-CM

## 2022-11-20 NOTE — Telephone Encounter (Signed)
Requested medication (s) are due for refill today - no  Requested medication (s) are on the active medication list -yes  Future visit scheduled -no  Last refill: 08/31/22 #90 3RF  Notes to clinic: non delegated Rx  Requested Prescriptions  Pending Prescriptions Disp Refills   temazepam (RESTORIL) 15 MG capsule 90 capsule 3    Sig: TAKE 1 CAPSULE(15 MG) BY MOUTH AT BEDTIME AS NEEDED FOR SLEEP     Not Delegated - Psychiatry: Anxiolytics/Hypnotics 2 Failed - 11/20/2022 11:42 AM      Failed - This refill cannot be delegated      Failed - Urine Drug Screen completed in last 360 days      Passed - Patient is not pregnant      Passed - Valid encounter within last 6 months    Recent Outpatient Visits           4 months ago Annual physical exam   [place] [facility] Jacky Kindle, FNP   1 year ago Essential hypertension   [place] [facility] Jacky Kindle, FNP   1 year ago Annual physical exam   [place] [facility] Jacky Kindle, FNP   2 years ago Primary hypertension   [place] [facility] Barnard, Marzella Schlein, MD   2 years ago Hypertension, unspecified type   [facility], Eula Fried, FNP                 Requested Prescriptions  Pending Prescriptions Disp Refills   temazepam (RESTORIL) 15 MG capsule 90 capsule 3    Sig: TAKE 1 CAPSULE(15 MG) BY MOUTH AT BEDTIME AS NEEDED FOR SLEEP     Not Delegated - Psychiatry: Anxiolytics/Hypnotics 2 Failed - 11/20/2022 11:42 AM      Failed - This refill cannot be delegated      Failed - Urine Drug Screen completed in last 360 days      Passed - Patient is not pregnant      Passed - Valid encounter within last 6 months    Recent Outpatient Visits           4 months ago Annual physical exam   [place] [facility] Jacky Kindle, FNP   1 year ago Essential hypertension   [place] [facility] Jacky Kindle, FNP   1 year ago Annual physical exam   [facility] Jacky Kindle, FNP   2 years ago Primary hypertension   [place] [facility] [place], Marzella Schlein, MD   2 years ago Hypertension, unspecified type   [facility] Flinchum, Eula Fried, FNP

## 2022-11-20 NOTE — Telephone Encounter (Signed)
Medication Refill - Medication:  temazepam (RESTORIL) 15 MG capsule -completely out-   Patient's brother in law is requesting the medication be sent in today,11/20/2022 however was advised of the "up to 3 business day" rule.  Has the patient contacted their pharmacy? Yes.  Advised to contact PCP office.  Preferred Pharmacy (with phone number or street name):  [id number] - [place],  - [address] AT [facility] [address] [facility]  Phone: [phone] Fax: [phone]  Has the patient been seen for an appointment in the last year OR does the patient have an upcoming appointment? Yes.  Last had CPE on 07/22/2022

## 2022-11-23 MED ORDER — TEMAZEPAM 15 MG PO CAPS: ORAL_CAPSULE | ORAL | 3 refills | Status: DC

## 2022-12-01 NOTE — Telephone Encounter (Signed)
Opened in error

## 2023-01-12 ENCOUNTER — Telehealth: Payer: Self-pay | Admitting: Family Medicine

## 2023-01-12 NOTE — Telephone Encounter (Signed)
Cala Bradford from [employer] called in states needs Dr Rhunette Croft approval for them to get a manufacturer change on levothyroxine from amneal to lupin

## 2023-01-13 NOTE — Telephone Encounter (Signed)
Consent given

## 2023-01-26 ENCOUNTER — Ambulatory Visit: Payer: 59

## 2023-01-26 VITALS — Ht 70.0 in | Wt 216.0 lb

## 2023-01-26 DIAGNOSIS — Z0000 Encounter for general adult medical examination without abnormal findings: Secondary | ICD-10-CM | POA: Diagnosis not present

## 2023-01-26 NOTE — Progress Notes (Signed)
Subjective:   Aaron Stokes is a 82 y.o. male who presents for Medicare Annual/Subsequent preventive examination.  Visit Complete: Virtual I connected with  Shavar Gorka Stonebraker on 01/26/23 by a audio enabled telemedicine application and verified that I am speaking with the correct person using two identifiers.  Patient Location: Home  Provider Location: Office/Clinic  I discussed the limitations of evaluation and management by telemedicine. The patient expressed understanding and agreed to proceed.  Vital Signs: Because this visit was a virtual/telehealth visit, some criteria may be missing or patient reported. Any vitals not documented were not able to be obtained and vitals that have been documented are patient reported.  Patient Medicare AWV questionnaire was completed by the patient on (not done); I have confirmed that all information answered by patient is correct and no changes since this date. Cardiac Risk Factors include: advanced age (>57men, >31 women);hypertension;male gender;obesity (BMI >30kg/m2)    Objective:    Today's Vitals   01/26/23 1304  Weight: 216 lb (98 kg)  Height: 5\' 10"  (1.778 m)   Body mass index is 30.99 kg/m.     01/26/2023    1:12 PM 12/16/2021   11:27 AM 11/06/2021   10:31 AM 09/06/2017   10:43 AM 06/22/2017    3:03 PM 06/18/2017    2:28 PM 10/23/2015    3:38 PM  Advanced Directives  Does Patient Have a Medical Advance Directive? No No No No No No No  Would patient like information on creating a medical advance directive?  Yes (MAU/Ambulatory/Procedural Areas - Information given) No - Patient declined No - Patient declined No - Patient declined  No - patient declined information    Current Medications (verified) Outpatient Encounter Medications as of 01/26/2023  Medication Sig   amLODipine (NORVASC) 10 MG tablet TAKE 1 TABLET(10 MG) BY MOUTH DAILY   aspirin EC (ASPIRIN LOW DOSE) 81 MG tablet TAKE 1 TABLET(81 MG) BY MOUTH DAILY   atenolol  (TENORMIN) 50 MG tablet Take 1 tablet (50 mg total) by mouth daily.   finasteride (PROSCAR) 5 MG tablet Take 1 tablet (5 mg total) by mouth daily.   levothyroxine (SYNTHROID) 75 MCG tablet Take 1 tablet (75 mcg total) by mouth daily before breakfast.   perphenazine-amitriptyline (ETRAFON/TRIAVIL) 4-25 MG TABS tablet Take 1 tablet by mouth at bedtime.   temazepam (RESTORIL) 15 MG capsule TAKE 1 CAPSULE(15 MG) BY MOUTH AT BEDTIME AS NEEDED FOR SLEEP   No facility-administered encounter medications on file as of 01/26/2023.    Allergies (verified) Patient has no known allergies.   History: Past Medical History:  Diagnosis Date   Acid reflux    Anxiety    Chronic kidney disease    UTI; hematuria   Hypertension    Insomnia    Mentally challenged    Past Surgical History:  Procedure Laterality Date   CATARACT EXTRACTION W/PHACO Left 12/16/2021   Procedure: CATARACT EXTRACTION PHACO  LEFT MALYUGIN RING 6.47 00:50.5 ;  Surgeon: Galen Manila, MD;  Location: [facility];  Service: Ophthalmology;  Laterality: Left;   CIRCUMCISION, NON-NEWBORN     CYSTOSCOPY WITH STENT PLACEMENT Right 08/21/2015   Procedure: cystoscopy;  Surgeon: Hildred Laser, MD;  Location: [facility];  Service: Urology;  Laterality: Right;   CYSTOSCOPY WITH STENT PLACEMENT Right 12/11/2015   Procedure: CYSTOSCOPY WITH STENT PLACEMENT;  Surgeon: Hildred Laser, MD;  Location: [facility];  Service: Urology;  Laterality: Right;   HOLEP-LASER ENUCLEATION OF THE PROSTATE WITH MORCELLATION N/A  10/23/2015   Procedure: HOLEP-LASER ENUCLEATION OF THE PROSTATE WITH MORCELLATION;  Surgeon: Vanna Scotland, MD;  Location: [facility];  Service: Urology;  Laterality: N/A;   TRANSURETHRAL RESECTION OF BLADDER TUMOR  12/11/2015   Procedure: TRANSURETHRAL RESECTION OF BLADDER TUMOR (TURBT);  Surgeon: Hildred Laser, MD;  Location: [facility];  Service: Urology;;   URETEROSCOPY WITH HOLMIUM LASER LITHOTRIPSY Right 12/11/2015    Procedure: URETEROSCOPY with biopsy;  Surgeon: Hildred Laser, MD;  Location: [facility];  Service: Urology;  Laterality: Right;   Family History  Problem Relation Age of Onset   Bladder Cancer Father    Cancer Father    Bladder Cancer Paternal Grandmother    Cancer Brother    Prostate cancer Neg Hx    Social History   Socioeconomic History   Marital status: Divorced    Spouse name: Not on file   Number of children: 0   Years of education: Not on file   Highest education level: 12th grade  Occupational History   Occupation: retired  Tobacco Use   Smoking status: Never   Smokeless tobacco: Never  Vaping Use   Vaping status: Never Used  Substance and Sexual Activity   Alcohol use: No    Alcohol/week: 0.0 standard drinks of alcohol   Drug use: No   Sexual activity: Not Currently  Other Topics Concern   Not on file  Social History Narrative   Not on file   Social Determinants of Health   Financial Resource Strain: Low Risk  (01/26/2023)   Overall Financial Resource Strain (CARDIA)    Difficulty of Paying Living Expenses: Not hard at all  Food Insecurity: No Food Insecurity (01/26/2023)   Hunger Vital Sign    Worried About Running Out of Food in the Last Year: Never true    Ran Out of Food in the Last Year: Never true  Transportation Needs: No Transportation Needs (01/26/2023)   PRAPARE - Administrator, Civil Service (Medical): No    Lack of Transportation (Non-Medical): No  Physical Activity: Insufficiently Active (01/26/2023)   Exercise Vital Sign    Days of Exercise per Week: 4 days    Minutes of Exercise per Session: 20 min  Stress: No Stress Concern Present (01/26/2023)   [employer] of Occupational Health - Occupational Stress Questionnaire    Feeling of Stress : Not at all  Social Connections: Socially Isolated (01/26/2023)   Social Connection and Isolation Panel [NHANES]    Frequency of Communication with Friends and Family: Three times a week     Frequency of Social Gatherings with Friends and Family: Once a week    Attends Religious Services: Never    Database administrator or Organizations: No    Attends Engineer, structural: Never    Marital Status: Divorced    Tobacco Counseling Counseling given: Not Answered   Clinical Intake:  Pre-visit preparation completed: Yes  Pain : No/denies pain    BMI - recorded: 30.99 Nutritional Status: BMI > 30  Obese Nutritional Risks: None Diabetes: No  How often do you need to have someone help you when you read instructions, pamphlets, or other written materials from your doctor or pharmacy?: 1 - Never  Interpreter Needed?: No  Comments: lives alone Information entered by :: B.Marieli Rudy,LPN   Activities of Daily Living    01/26/2023    1:12 PM 07/22/2022   10:14 AM  In your present state of health, do you have any difficulty performing  the following activities:  Hearing? 0 0  Vision? 0 0  Difficulty concentrating or making decisions? 0 0  Walking or climbing stairs? 0 0  Dressing or bathing? 0 0  Doing errands, shopping? 0 0  Preparing Food and eating ? N   Using the Toilet? N   In the past six months, have you accidently leaked urine? N   Do you have problems with loss of bowel control? N   Managing your Medications? N   Managing your Finances? N   Housekeeping or managing your Housekeeping? N     Patient Care Team: Jacky Kindle, FNP as PCP - General (Family Medicine) [facility], [facility])  Indicate any recent Medical Services you may have received from other than [facility] providers in the past year (date may be approximate).     Assessment:   This is a routine wellness examination for [place].  Hearing/Vision screen Hearing Screening - Comments:: Pt says his hearing is just fine Vision Screening - Comments:: Pt says his vision is fine;wears glasses [facility]   Goals Addressed             This Visit's Progress    COMPLETED:  DIET - EAT MORE FRUITS AND VEGETABLES   On track    COMPLETED: DIET - INCREASE WATER INTAKE   On track    Recommend increasing water intake to 6 glasses a day.        Depression Screen    01/26/2023    1:10 PM 07/22/2022   10:14 AM 11/20/2021   11:17 AM 11/06/2021   10:29 AM 05/08/2021   10:57 AM 08/05/2020   11:08 AM 01/05/2020    3:21 PM  PHQ 2/9 Scores  PHQ - 2 Score 0 0 0 0 0 0 0  PHQ- 9 Score  1 0 0 0 0     Fall Risk    01/26/2023    1:06 PM 07/22/2022   10:13 AM 11/20/2021   11:17 AM 11/06/2021   10:31 AM 08/05/2020   11:07 AM  Fall Risk   Falls in the past year? 0 0 0 0 0  Number falls in past yr: 0 0 0 0 0  Injury with Fall? 0 0 0 0 0  Risk for fall due to : No Fall Risks No Fall Risks No Fall Risks No Fall Risks No Fall Risks  Follow up Education provided;Falls prevention discussed Falls evaluation completed Falls evaluation completed Falls evaluation completed Falls evaluation completed    MEDICARE RISK AT HOME: Medicare Risk at Home Any stairs in or around the home?: No If so, are there any without handrails?: No Home free of loose throw rugs in walkways, pet beds, electrical cords, etc?: Yes Adequate lighting in your home to reduce risk of falls?: Yes Life alert?: No Use of a cane, walker or w/c?: No Grab bars in the bathroom?: Yes Shower chair or bench in shower?: No Elevated toilet seat or a handicapped toilet?: No  TIMED UP AND GO:  Was the test performed?  No    Cognitive Function:        01/26/2023    1:13 PM  6CIT Screen  What Year? 0 points  What month? 0 points  What time? 0 points  Count back from 20 0 points  Months in reverse 0 points  Repeat phrase 0 points  Total Score 0 points    Immunizations Immunization History  Administered Date(s) Administered   Fluad Quad(high Dose 65+)  01/05/2020   Influenza, High Dose Seasonal PF 02/21/2019   Influenza-Unspecified 02/04/2015, 02/19/2017, 02/18/2018   PFIZER(Purple Top)SARS-COV-2 Vaccination  07/16/2019, 08/08/2019, 03/22/2020   Pneumococcal Conjugate-13 01/05/2020   Pneumococcal Polysaccharide-23 11/10/2018   Zoster Recombinant(Shingrix) 07/21/2016    TDAP status: Up to date  Flu Vaccine status: Declined, Education has been provided regarding the importance of this vaccine but patient still declined. Advised may receive this vaccine at local pharmacy or Health Dept. Aware to provide a copy of the vaccination record if obtained from local pharmacy or Health Dept. Verbalized acceptance and understanding.  Pneumococcal vaccine status: Up to date  Covid-19 vaccine status: Completed vaccines  Qualifies for Shingles Vaccine? Yes   Zostavax completed Yes  #1 Shingrix Completed?: No.    Education has been provided regarding the importance of this vaccine. Patient has been advised to call insurance company to determine out of pocket expense if they have not yet received this vaccine. Advised may also receive vaccine at local pharmacy or Health Dept. Verbalized acceptance and understanding.  Screening Tests Health Maintenance  Topic Date Due   DTaP/Tdap/Td (1 - Tdap) Never done   Zoster Vaccines- Shingrix (2 of 2) 09/15/2016   INFLUENZA VACCINE  11/19/2022   COVID-19 Vaccine (4 - 2023-24 season) 12/20/2022   Medicare Annual Wellness (AWV)  01/26/2024   Pneumonia Vaccine 71+ Years old  Completed   HPV VACCINES  Aged Out   Hepatitis C Screening  Discontinued    Health Maintenance  Health Maintenance Due  Topic Date Due   DTaP/Tdap/Td (1 - Tdap) Never done   Zoster Vaccines- Shingrix (2 of 2) 09/15/2016   INFLUENZA VACCINE  11/19/2022   COVID-19 Vaccine (4 - 2023-24 season) 12/20/2022    Colorectal cancer screening: No longer required.   Lung Cancer Screening: (Low Dose CT Chest recommended if Age 78-80 years, 20 pack-year currently smoking OR have quit w/in 15years.) does not qualify.   Lung Cancer Screening Referral: no  Additional Screening:  Hepatitis C Screening:  does not qualify; Completed 01/05/2020  Vision Screening: Recommended annual ophthalmology exams for early detection of glaucoma and other disorders of the eye. Is the patient up to date with their annual eye exam?  Yes  Who is the provider or what is the name of the office in which the patient attends annual eye exams? [facility] If pt is not established with a provider, would they like to be referred to a provider to establish care? No .   Dental Screening: Recommended annual dental exams for proper oral hygiene  Diabetic Foot Exam: n/a  Community Resource Referral / Chronic Care Management: CRR required this visit?  No   CCM required this visit?  Appt scheduled with PCP    Plan:     I have personally reviewed and noted the following in the patient's chart:   Medical and social history Use of alcohol, tobacco or illicit drugs  Current medications and supplements including opioid prescriptions. Patient is not currently taking opioid prescriptions. Functional ability and status Nutritional status Physical activity Advanced directives List of other physicians Hospitalizations, surgeries, and ER visits in previous 12 months Vitals Screenings to include cognitive, depression, and falls Referrals and appointments  In addition, I have reviewed and discussed with patient certain preventive protocols, quality metrics, and best practice recommendations. A written personalized care plan for preventive services as well as general preventive health recommendations were provided to patient.    Sue Lush, LPN   40/12/8117   After  Visit Summary: (Declined) Due to this being a telephonic visit, with patients personalized plan was offered to patient but patient Declined AVS at this time   Nurse Notes: The patient states he is doing well and has no concerns or questions at this time.

## 2023-01-26 NOTE — Patient Instructions (Signed)
Aaron Stokes , Thank you for taking time to come for your Medicare Wellness Visit. I appreciate your ongoing commitment to your health goals. Please review the following plan we discussed and let me know if I can assist you in the future.   Referrals/Orders/Follow-Ups/Clinician Recommendations: appt with PCP made for PE  This is a list of the screening recommended for you and due dates:  Health Maintenance  Topic Date Due   DTaP/Tdap/Td vaccine (1 - Tdap) Never done   Zoster (Shingles) Vaccine (2 of 2) 09/15/2016   Flu Shot  11/19/2022   COVID-19 Vaccine (4 - 2023-24 season) 12/20/2022   Medicare Annual Wellness Visit  01/26/2024   Pneumonia Vaccine  Completed   HPV Vaccine  Aged Out   Hepatitis C Screening  Discontinued    Advanced directives: (Declined) Advance directive discussed with you today. Even though you declined this today, please call our office should you change your mind, and we can give you the proper paperwork for you to fill out.  Next Medicare Annual Wellness Visit scheduled for next year: Yes 02/01/24 @ 1pm telephone

## 2023-02-26 ENCOUNTER — Other Ambulatory Visit: Payer: Self-pay | Admitting: Physician Assistant

## 2023-02-26 DIAGNOSIS — N401 Enlarged prostate with lower urinary tract symptoms: Secondary | ICD-10-CM

## 2023-02-26 DIAGNOSIS — I10 Essential (primary) hypertension: Secondary | ICD-10-CM

## 2023-05-26 ENCOUNTER — Other Ambulatory Visit: Payer: Self-pay | Admitting: Family Medicine

## 2023-05-26 DIAGNOSIS — G4701 Insomnia due to medical condition: Secondary | ICD-10-CM

## 2023-05-26 MED ORDER — TEMAZEPAM 15 MG PO CAPS: ORAL_CAPSULE | ORAL | 0 refills | Status: DC

## 2023-05-26 NOTE — Telephone Encounter (Signed)
 [facility] pharmacy faxed refill request for the following medications:   temazepam  (RESTORIL ) 15 MG capsule     Please advise

## 2023-06-01 IMAGING — US US RENAL
1 series · 14 of 25 positions shown · non-contrast
Comparison: 09/03/2015

CLINICAL DATA: Stage III A chronic kidney disease.

EXAM:
RENAL / URINARY TRACT ULTRASOUND COMPLETE

[Series 1: us renal · 0.26mm/px · 14 of 41 slices shown]
[im 1/41]
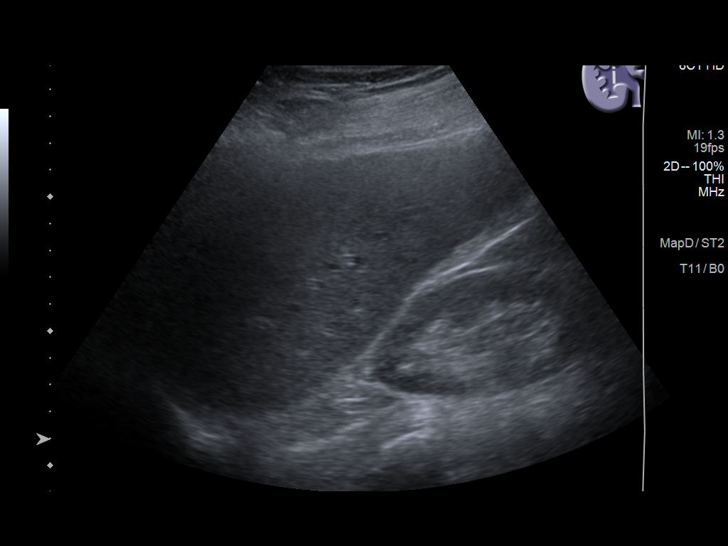
[im 4/41]
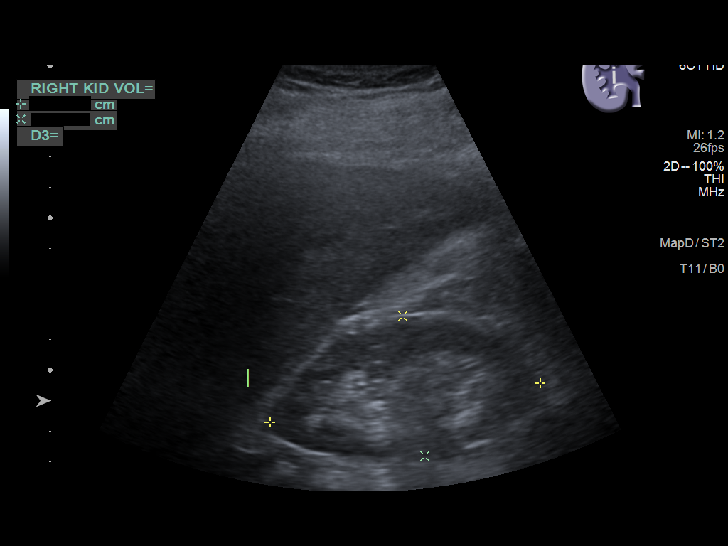
[im 7/41]
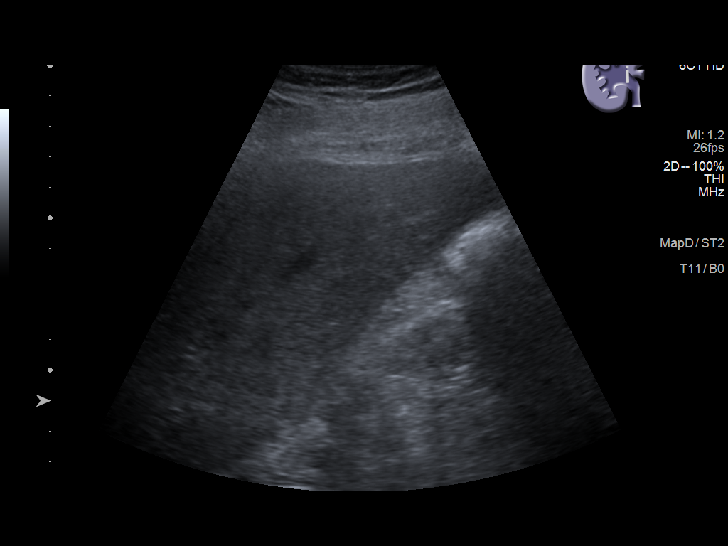
[im 11/41]
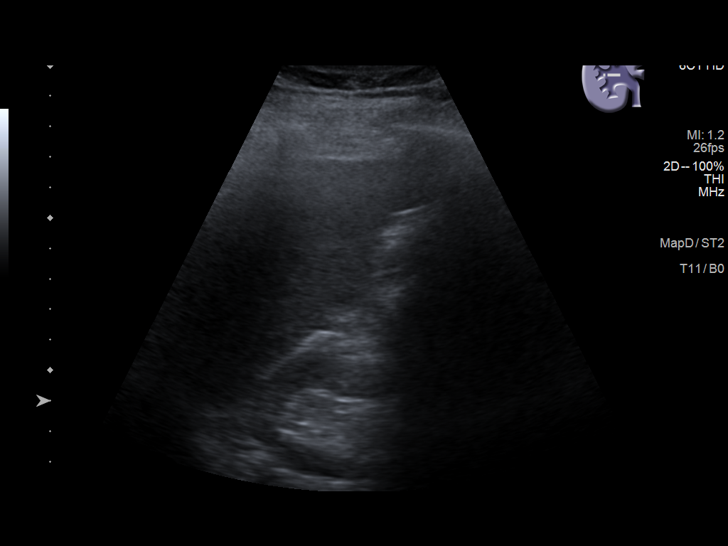
[im 14/41]
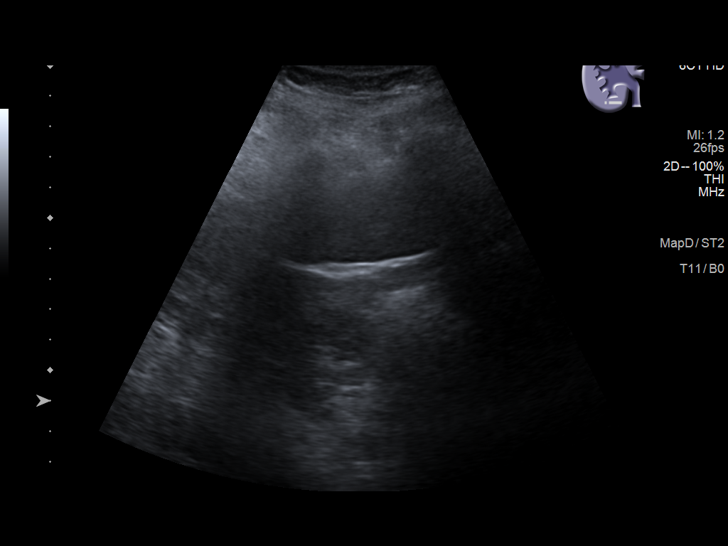
[im 16/41]
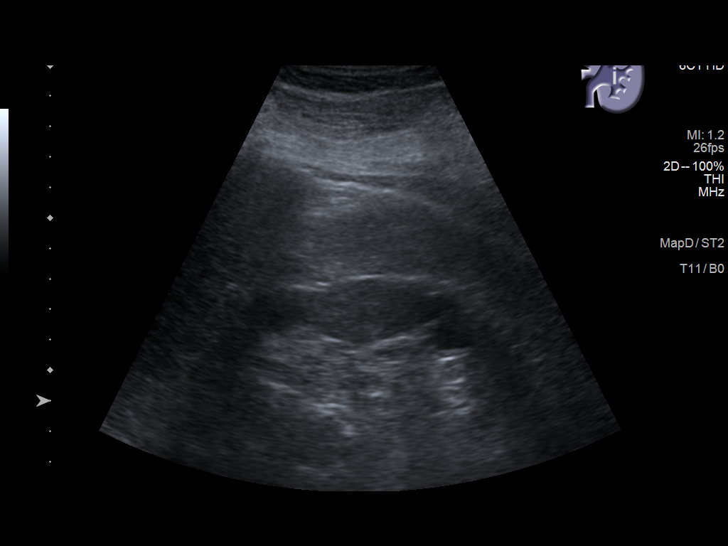
[im 19/41]
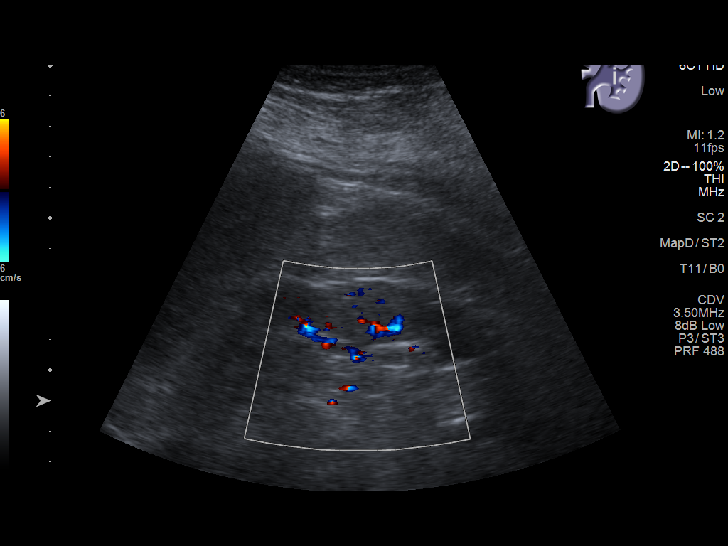
[im 22/41]
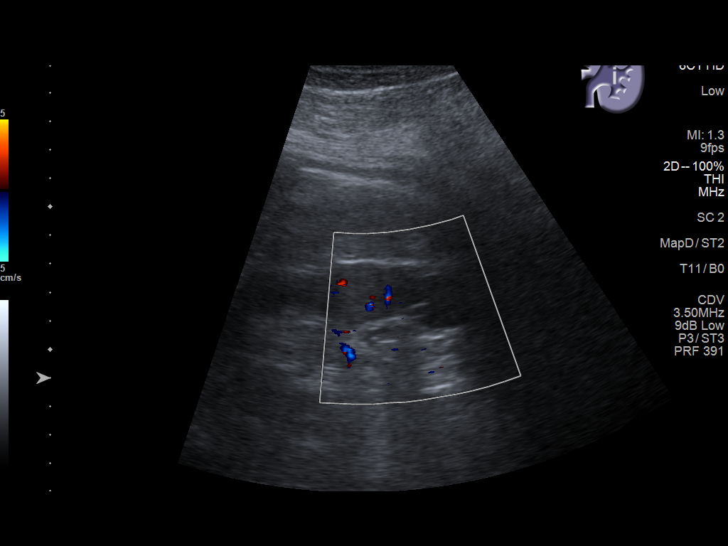
[im 26/41]
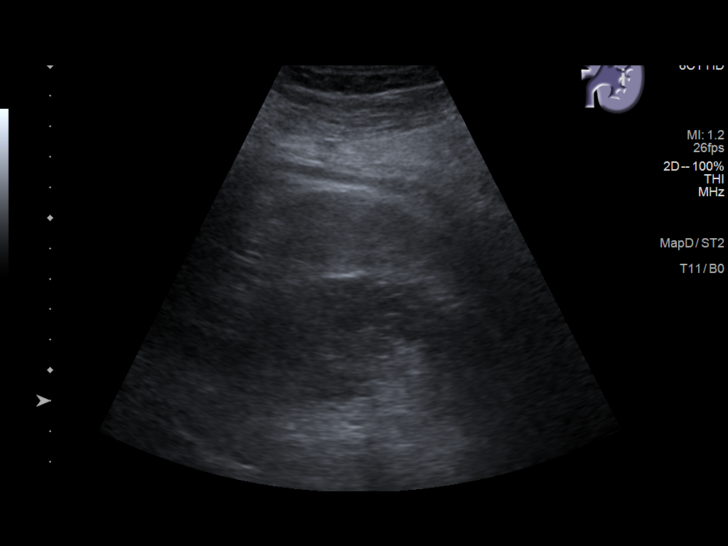
[im 27/41]
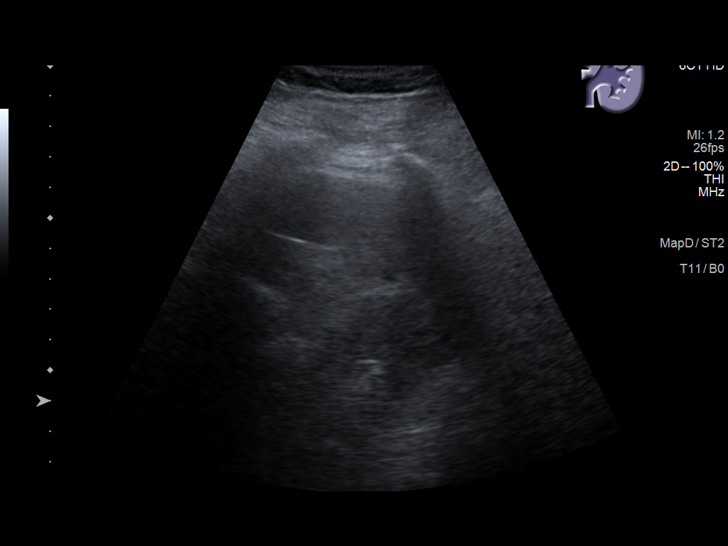
[im 31/41]
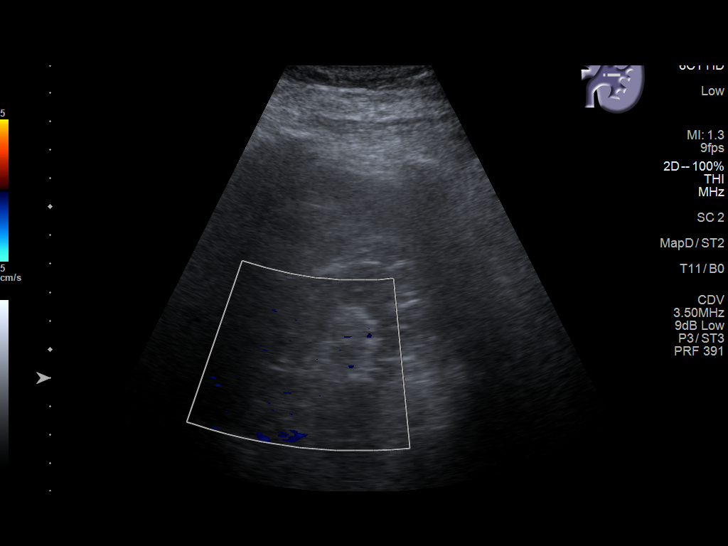
[im 34/41]
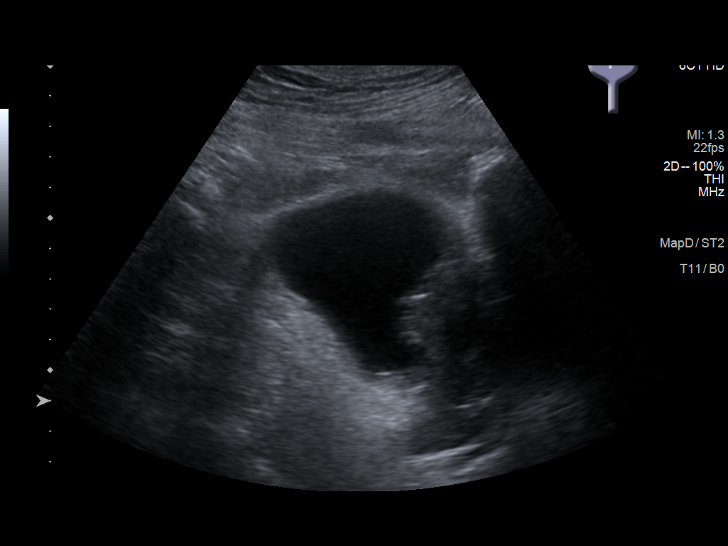
[im 37/41]
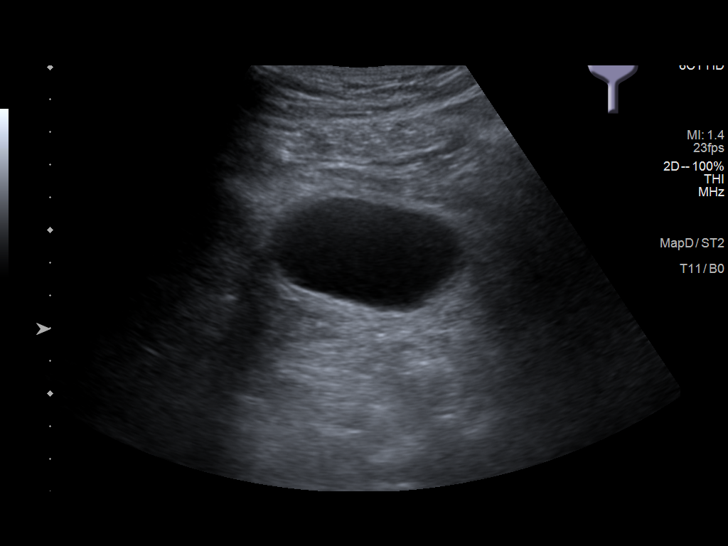
[im 41/41]
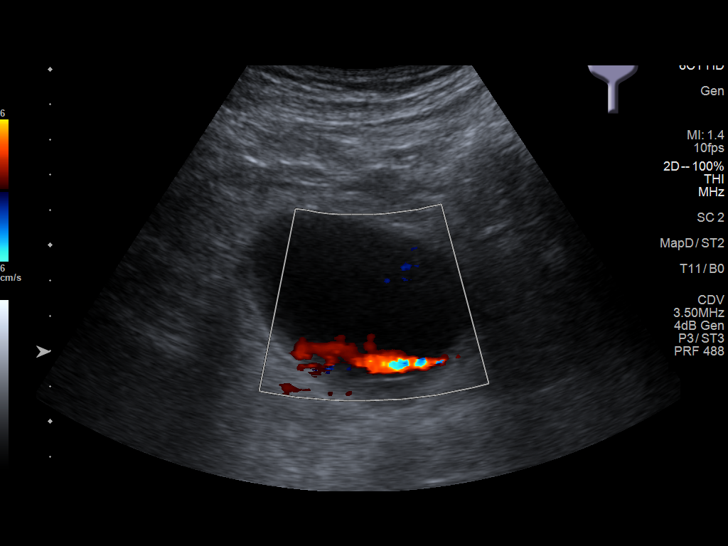

[14 of 25 positions shown; findings below may reference images not displayed]

FINDINGS: Right Kidney:

Renal measurements: 9 x 4.7 x 4.5 cm = volume: 98 mL. Normal
parenchymal echogenicity. No hydronephrosis. No visualized stone or
focal lesion. Tiny cysts on prior CT are not seen by ultrasound.

Left Kidney:

Renal measurements: 10.3 x 5.3 x 4.7 cm = volume: 134 mL. No
hydronephrosis. Normal parenchymal echogenicity. Septated cyst in
the lower pole measures 2.1 x 1.9 x 1.9 cm. There is no septal
nodularity or vascularity. No visualized stone.

Bladder:

Partially distended. Appears normal for degree of bladder
distention. Both ureteral jets are demonstrated.

Other:

None.
IMPRESSION: 1. Complex septated cyst in the lower pole of the left kidney
measuring 2.1 cm.
2. No hydronephrosis.

## 2023-06-11 ENCOUNTER — Telehealth: Payer: Self-pay | Admitting: Family Medicine

## 2023-06-11 DIAGNOSIS — E039 Hypothyroidism, unspecified: Secondary | ICD-10-CM

## 2023-06-11 NOTE — Telephone Encounter (Signed)
[facility] faxed refill request for the following medications:   levothyroxine (SYNTHROID) 75 MCG tablet     Please advise.

## 2023-06-15 ENCOUNTER — Other Ambulatory Visit: Payer: Self-pay

## 2023-06-15 ENCOUNTER — Telehealth: Payer: Self-pay | Admitting: Family Medicine

## 2023-06-15 DIAGNOSIS — E039 Hypothyroidism, unspecified: Secondary | ICD-10-CM

## 2023-06-15 MED ORDER — LEVOTHYROXINE SODIUM 75 MCG PO TABS: 75.0000 ug | ORAL_TABLET | Freq: Every day | ORAL | 3 refills | Status: AC

## 2023-06-15 NOTE — Telephone Encounter (Signed)
 [facility] faxed refill request for the following medications:   levothyroxine (SYNTHROID) 75 MCG tablet     Please advise

## 2023-06-15 NOTE — Telephone Encounter (Signed)
 Rx sent to requested pharmacy

## 2023-06-29 ENCOUNTER — Other Ambulatory Visit: Payer: Self-pay | Admitting: Family Medicine

## 2023-06-29 DIAGNOSIS — F209 Schizophrenia, unspecified: Secondary | ICD-10-CM

## 2023-06-29 DIAGNOSIS — G4701 Insomnia due to medical condition: Secondary | ICD-10-CM

## 2023-06-29 NOTE — Telephone Encounter (Signed)
 LOV 4*3*24 NOV [id number] LRF [id number] LABS [id number]

## 2023-06-29 NOTE — Telephone Encounter (Signed)
 [facility] faxed refill request for the following medications:   perphenazine-amitriptyline (ETRAFON/TRIAVIL) 4-25 MG TABS tablet    Please advise.

## 2023-06-30 MED ORDER — PERPHENAZINE-AMITRIPTYLINE 4-25 MG PO TABS: 1.0000 | ORAL_TABLET | Freq: Every day | ORAL | 1 refills | Status: DC

## 2023-08-05 ENCOUNTER — Encounter: Payer: 59 | Admitting: Family Medicine

## 2023-08-05 ENCOUNTER — Other Ambulatory Visit: Payer: Self-pay | Admitting: Family Medicine

## 2023-08-05 DIAGNOSIS — I739 Peripheral vascular disease, unspecified: Secondary | ICD-10-CM

## 2023-08-05 NOTE — Telephone Encounter (Signed)
 [facility] pharmacy is requesting refill aspirin EC (ASPIRIN LOW DOSE) 81 MG tablet   Please advise

## 2023-08-06 MED ORDER — ASPIRIN 81 MG PO TBEC: DELAYED_RELEASE_TABLET | ORAL | 3 refills | Status: DC

## 2023-08-06 NOTE — Telephone Encounter (Signed)
 LOV 4*3*24 NOV 5*22*25 LRF [id number] LABS [id number]

## 2023-08-10 NOTE — Telephone Encounter (Signed)
 Received another fax from [facility] requesting aspirin  EC (ASPIRIN  LOW DOSE) 81 MG tablet.  Looks like it was sent to [facility].  Please send to correct pharmacy.

## 2023-09-09 ENCOUNTER — Ambulatory Visit (INDEPENDENT_AMBULATORY_CARE_PROVIDER_SITE_OTHER): Admitting: Family Medicine

## 2023-09-09 ENCOUNTER — Encounter: Payer: Self-pay | Admitting: Family Medicine

## 2023-09-09 VITALS — BP 131/69 | HR 51 | Ht 70.0 in | Wt 216.0 lb

## 2023-09-09 DIAGNOSIS — I10 Essential (primary) hypertension: Secondary | ICD-10-CM

## 2023-09-09 DIAGNOSIS — R338 Other retention of urine: Secondary | ICD-10-CM

## 2023-09-09 DIAGNOSIS — E039 Hypothyroidism, unspecified: Secondary | ICD-10-CM

## 2023-09-09 DIAGNOSIS — N1831 Chronic kidney disease, stage 3a: Secondary | ICD-10-CM

## 2023-09-09 DIAGNOSIS — N401 Enlarged prostate with lower urinary tract symptoms: Secondary | ICD-10-CM

## 2023-09-09 DIAGNOSIS — I739 Peripheral vascular disease, unspecified: Secondary | ICD-10-CM

## 2023-09-09 DIAGNOSIS — G4701 Insomnia due to medical condition: Secondary | ICD-10-CM

## 2023-09-09 DIAGNOSIS — F209 Schizophrenia, unspecified: Secondary | ICD-10-CM

## 2023-09-09 MED ORDER — TEMAZEPAM 15 MG PO CAPS: ORAL_CAPSULE | ORAL | 0 refills | Status: DC

## 2023-09-09 MED ORDER — ASPIRIN 81 MG PO TBEC: DELAYED_RELEASE_TABLET | ORAL | 3 refills | Status: DC

## 2023-09-09 NOTE — Progress Notes (Addendum)
 Established patient visit   Patient: Aaron Stokes   DOB: January 17, 1941   83 y.o. Male  MRN: [id number] Visit Date: 09/09/2023  Today's healthcare provider: Rockie Agent, MD   Chief Complaint  Patient presents with  . Establish Care    Needs meds refilled    Subjective     HPI     Establish Care    Additional comments: Needs meds refilled       Last edited by Thelbert Eulalio HERO, CMA on 09/09/2023  2:20 PM.       Discussed the use of AI scribe software for clinical note transcription with the patient, who gave verbal consent to proceed.  History of Present Illness Aaron Stokes is an 83 year old male who presents to transfer care from his previous provider.  He requires refills for his medications, including Restoril  15 mg for sleep and aspirin  81 mg for cardiovascular prevention. He manages hypothyroidism with Synthroid  (levothyroxine ) 75 micrograms daily. He takes atenolol  50 mg once daily and amlodipine  10 mg for hypertension. He has a history of benign prostatic hyperplasia and is taking finasteride . His schizophrenia is stable on a combination medication of amitriptyline  and perphenazine  (Erythron Triavil ) at a dose of 4-25 mg. No auditory or visual hallucinations.  He has a history of stage 3 chronic kidney disease with a recent creatinine level of 1.3 mg/dL and an estimated glomerular filtration rate (eGFR) of 52 mL/min/1.84m. He saw a kidney specialist about two to three weeks ago and reports no changes in his condition since then. No other specialists, including psychiatry or psychology, are involved in his care, and his symptoms have been stable.     Past Medical History:  Diagnosis Date  . Acid reflux   . Anxiety   . Chronic kidney disease    UTI; hematuria  . Hypertension   . Insomnia   . Mentally challenged     Medications: Outpatient Medications Prior to Visit  Medication Sig  . amLODipine  (NORVASC ) 10 MG tablet TAKE 1 TABLET BY  MOUTH DAILY  . atenolol  (TENORMIN ) 50 MG tablet TAKE 1 TABLET BY MOUTH DAILY  . finasteride  (PROSCAR ) 5 MG tablet TAKE 1 TABLET BY MOUTH DAILY  . levothyroxine  (SYNTHROID ) 75 MCG tablet Take 1 tablet (75 mcg total) by mouth daily before breakfast.  . perphenazine -amitriptyline  (ETRAFON /TRIAVIL ) 4-25 MG TABS tablet Take 1 tablet by mouth at bedtime.  . [DISCONTINUED] aspirin  EC (ASPIRIN  LOW DOSE) 81 MG tablet TAKE 1 TABLET(81 MG) BY MOUTH DAILY  . [DISCONTINUED] temazepam  (RESTORIL ) 15 MG capsule TAKE 1 CAPSULE(15 MG) BY MOUTH AT BEDTIME AS NEEDED FOR SLEEP   No facility-administered medications prior to visit.    Review of Systems  Last metabolic panel Lab Results  Component Value Date   GLUCOSE 90 09/09/2023   NA 141 09/09/2023   K 4.3 09/09/2023   CL 104 09/09/2023   CO2 25 09/09/2023   BUN 18 09/09/2023   CREATININE 1.33 (H) 09/09/2023   EGFR 53 (L) 09/09/2023   CALCIUM 10.5 (H) 09/09/2023   PHOS 2.6 (L) 11/05/2020   PROT 7.0 07/22/2022   ALBUMIN 4.0 07/22/2022   LABGLOB 3.0 07/22/2022   AGRATIO 1.3 07/22/2022   BILITOT 1.1 07/22/2022   ALKPHOS 85 07/22/2022   AST 24 07/22/2022   ALT 14 07/22/2022   ANIONGAP 2 (L) 12/05/2015   Last lipids Lab Results  Component Value Date   CHOL 172 07/22/2022   HDL 60 07/22/2022  LDLCALC 99 07/22/2022   TRIG 70 07/22/2022   CHOLHDL 2.9 07/22/2022   Last hemoglobin A1c Lab Results  Component Value Date   HGBA1C 5.1 07/22/2022   Last thyroid  functions Lab Results  Component Value Date   TSH 2.840 09/09/2023        Objective    BP 131/69   Pulse (!) 51   Ht 5' 10 (1.778 m)   Wt 216 lb (98 kg)   SpO2 100%   BMI 30.99 kg/m      Physical Exam Vitals reviewed.  Constitutional:      General: He is not in acute distress.    Appearance: Normal appearance. He is not ill-appearing.   Cardiovascular:     Rate and Rhythm: Regular rhythm. Bradycardia present.  Pulmonary:     Effort: Pulmonary effort is normal. No  respiratory distress.     Breath sounds: No wheezing, rhonchi or rales.   Neurological:     Mental Status: He is alert and oriented to person, place, and time.   Psychiatric:        Mood and Affect: Mood normal.        Behavior: Behavior normal.      Results for orders placed or performed in visit on 09/09/23  TSH+T4F+T3Free  Result Value Ref Range   TSH 2.840 0.450 - 4.500 uIU/mL   T3, Free 2.5 2.0 - 4.4 pg/mL   Free T4 1.58 0.82 - 1.77 ng/dL  AFE1+ZHQM  Result Value Ref Range   Glucose 90 70 - 99 mg/dL   BUN 18 8 - 27 mg/dL   Creatinine, Ser 8.66 (H) 0.76 - 1.27 mg/dL   eGFR 53 (L) >40 fO/fpw/8.26   BUN/Creatinine Ratio 14 10 - 24   Sodium 141 134 - 144 mmol/L   Potassium 4.3 3.5 - 5.2 mmol/L   Chloride 104 96 - 106 mmol/L   CO2 25 20 - 29 mmol/L   Calcium 10.5 (H) 8.6 - 10.2 mg/dL    Assessment & Plan     Problem List Items Addressed This Visit       Cardiovascular and Mediastinum   Essential hypertension   Relevant Orders   BMP8+EGFR (Completed)     Endocrine   Hypothyroidism   Relevant Orders   TSH+T4F+T3Free (Completed)     Genitourinary   Stage 3a chronic kidney disease (HCC) - Primary   Relevant Orders   BMP8+EGFR (Completed)   Benign prostatic hyperplasia with urinary retention     Other   Schizophrenia (HCC)   Insomnia due to medical condition   Other Visit Diagnoses       PAD (peripheral artery disease) (HCC)            Assessment & Plan Stage 3 Chronic Kidney Disease Chronic kidney disease stage 3 with creatinine 1.3 mg/dL and eGFR 52 fO/fpw/8.26 m. Calcium level is 10.6 mg/dL. Under nephrologist Dr. Walterine care with no recent management changes. - Order basic metabolic panel - Continue follow-up with nephrologist  Hypertension Hypertension well controlled with atenolol  and amlodipine .  Hypothyroidism Hypothyroidism managed with levothyroxine  75 mcg daily. Thyroid  function tests due for monitoring. - Order thyroid   studies  Benign Prostatic Hyperplasia Benign prostatic hyperplasia managed with finasteride . No new symptoms reported.  Schizophrenia Schizophrenia managed with Triavil  4-25 mg. No psychiatric symptoms such as hallucinations reported. Does not see a psychiatrist or psychologist. - CONTINUE ETRAFON /TRIAVIL  4-25MG  DAILY as patient has been stable on this regimen -will request PA for exception to plan for  coverage, received notification that this medication is not covered   General Health Maintenance Aspirin  81 mg for cardiovascular prevention. Restoril  15 mg for sleep. - Refill aspirin  81 mg - Refill Restoril  15 mg  Follow-up Follow-up appointment scheduled for October 14th for wellness visit. - Schedule blood work today - Schedule next appointment with front desk     Return in about 1 year (around 09/08/2024) for CHRONIC F/U.         Rockie Agent, MD  [facility] [phone] (phone) [phone] (fax)  [facility]

## 2023-09-10 ENCOUNTER — Ambulatory Visit: Payer: Self-pay | Admitting: Family Medicine

## 2023-09-10 LAB — BMP8+EGFR
BUN/Creatinine Ratio: 14 (ref 10–24)
BUN: 18 mg/dL (ref 8–27)
CO2: 25 mmol/L (ref 20–29)
Calcium: 10.5 mg/dL — ABNORMAL HIGH (ref 8.6–10.2)
Chloride: 104 mmol/L (ref 96–106)
Creatinine, Ser: 1.33 mg/dL — ABNORMAL HIGH (ref 0.76–1.27)
Glucose: 90 mg/dL (ref 70–99)
Potassium: 4.3 mmol/L (ref 3.5–5.2)
Sodium: 141 mmol/L (ref 134–144)
eGFR: 53 mL/min/1.73 — ABNORMAL LOW

## 2023-09-10 LAB — TSH+T4F+T3FREE
Free T4: 1.58 ng/dL (ref 0.82–1.77)
T3, Free: 2.5 pg/mL (ref 2.0–4.4)
TSH: 2.84 u[IU]/mL (ref 0.450–4.500)

## 2023-09-21 ENCOUNTER — Telehealth: Payer: Self-pay | Admitting: Family Medicine

## 2023-09-21 NOTE — Telephone Encounter (Unsigned)
 Copied from CRM [id number]. Topic: Clinical - Medication Refill >> Sep 21, 2023 10:15 AM Chrystal Crape R wrote: Medication:  aspirin  EC (ASPIRIN  LOW DOSE) 81 MG tablet  temazepam  (RESTORIL ) 15 MG capsule   Has the patient contacted their pharmacy? Yes, there we no orders in system please send them [facility] not [facility]   This is the patient's preferred pharmacy:   [id number] Tyrone Gallop, [place] - [address] AT [facility] [address] [place] [address] Phone: [phone] Fax: [phone]  Is this the correct pharmacy for this prescription? Yes If no, delete pharmacy and type the correct one.   Has the prescription been filled recently? Yes  Is the patient out of the medication? Yes  Has the patient been seen for an appointment in the last year OR does the patient have an upcoming appointment? Yes  Can we respond through MyChart? No  Agent: Please be advised that Rx refills may take up to 3 business days. We ask that you follow-up with your pharmacy.

## 2023-09-22 ENCOUNTER — Telehealth: Payer: Self-pay

## 2023-09-22 ENCOUNTER — Other Ambulatory Visit: Payer: Self-pay

## 2023-09-22 ENCOUNTER — Telehealth: Payer: Self-pay | Admitting: Family Medicine

## 2023-09-22 DIAGNOSIS — G4701 Insomnia due to medical condition: Secondary | ICD-10-CM

## 2023-09-22 MED ORDER — TEMAZEPAM 15 MG PO CAPS: ORAL_CAPSULE | ORAL | 0 refills | Status: DC

## 2023-09-22 MED ORDER — TEMAZEPAM 15 MG PO CAPS: ORAL_CAPSULE | ORAL | 1 refills | Status: DC

## 2023-09-22 NOTE — Telephone Encounter (Signed)
 Unable to get call to go through.   Patient had last refill on 09/09/23 for 90 pills he should not need any at this time.     Copied from CRM [id number]. Topic: Clinical - Medication Question >> Sep 22, 2023 12:48 PM Chrystal Crape R wrote: Pt niece calling to follow up on medication status temazepam  (RESTORIL ) 15 MG capsule [[id number]] to be sent to [facility]

## 2023-09-22 NOTE — Telephone Encounter (Signed)
 [facility] pharmacy is requesting refill temazepam  (RESTORIL ) 15 MG capsule  Please advise

## 2023-09-22 NOTE — Telephone Encounter (Signed)
 Patient has not received medication from [facility] yet.  30 day supply sent to local pharmacy to get patient through until it does arrive.  Patient no longer wants to use [facility]. Next refill should go local   Copied from CRM [id number]. Topic: Clinical - Prescription Issue >> Sep 22, 2023  4:08 PM Santiya F wrote: Reason for CRM: Patient's niece is calling in to check that status of the refill request that was put in. She is requesting provider's nurse give her a call back. Please advise.

## 2023-09-22 NOTE — Telephone Encounter (Signed)
 [facility] pharmacy is requesting refill aspirin EC (ASPIRIN LOW DOSE) 81 MG tablet   Please advise

## 2023-09-23 ENCOUNTER — Other Ambulatory Visit: Payer: Self-pay

## 2023-09-23 DIAGNOSIS — I739 Peripheral vascular disease, unspecified: Secondary | ICD-10-CM

## 2023-09-23 MED ORDER — ASPIRIN 81 MG PO TBEC: DELAYED_RELEASE_TABLET | ORAL | 3 refills | Status: AC

## 2023-09-23 NOTE — Telephone Encounter (Signed)
 This was sent in on 09/09/23 but [facility] does not have record of Rx. Patient no longer wants to use [facility] and wants it sent to [facility] in [place]

## 2023-09-23 NOTE — Telephone Encounter (Signed)
 Patient did not get from [facility], new Rx sent to local pharmacy

## 2023-09-23 NOTE — Telephone Encounter (Signed)
Converted to rf req

## 2023-09-24 ENCOUNTER — Other Ambulatory Visit: Payer: Self-pay | Admitting: Family Medicine

## 2023-09-24 DIAGNOSIS — G4701 Insomnia due to medical condition: Secondary | ICD-10-CM

## 2023-09-24 MED ORDER — TEMAZEPAM 15 MG PO CAPS: ORAL_CAPSULE | ORAL | 0 refills | Status: DC

## 2023-09-24 NOTE — Telephone Encounter (Signed)
 Spoke with pharmacy rx will be filled Sunday after 11 am and patient will be notified at the number ending in [phone].

## 2023-09-24 NOTE — Telephone Encounter (Signed)
 Copied from CRM [id number]. Topic: Clinical - Medication Question >> Sep 24, 2023 11:49 AM Donald Frost wrote: Reason for CRM: Porfirio Bristol the pharmacist at [facility] wants to know if the provider got the control override for early release for it being a controlled substance for the patients temazepam  (RESTORIL ) 15 MG capsule that was sent in on Wednesday. This was supposed to have been delivered to the patient earlier but he never got it from the mail order pharmacy. Please assist patient as soon as possible by contacting  [facility][id number] Tyrone Gallop, [place] - [address] AT [facility] OF [address] [facility]  Phone: [phone] Fax: [phone]   They are closed from 1:30-2 for lunch

## 2023-11-01 ENCOUNTER — Other Ambulatory Visit: Payer: Self-pay | Admitting: Family Medicine

## 2023-11-01 DIAGNOSIS — F209 Schizophrenia, unspecified: Secondary | ICD-10-CM

## 2023-11-01 DIAGNOSIS — G4701 Insomnia due to medical condition: Secondary | ICD-10-CM

## 2023-11-02 NOTE — Telephone Encounter (Signed)
 LOV 09/09/23 NOV 02/01/24 LRF 06/30/23 LABS 09/09/23

## 2023-11-11 ENCOUNTER — Telehealth: Payer: Self-pay | Admitting: Family Medicine

## 2023-11-11 DIAGNOSIS — I10 Essential (primary) hypertension: Secondary | ICD-10-CM

## 2023-11-11 DIAGNOSIS — N401 Enlarged prostate with lower urinary tract symptoms: Secondary | ICD-10-CM

## 2023-11-11 MED ORDER — ATENOLOL 50 MG PO TABS: 50.0000 mg | ORAL_TABLET | Freq: Every day | ORAL | 2 refills | Status: DC

## 2023-11-11 MED ORDER — FINASTERIDE 5 MG PO TABS: 5.0000 mg | ORAL_TABLET | Freq: Every day | ORAL | 2 refills | Status: DC

## 2023-11-11 MED ORDER — AMLODIPINE BESYLATE 10 MG PO TABS: ORAL_TABLET | ORAL | 2 refills | Status: DC

## 2023-11-11 NOTE — Telephone Encounter (Signed)
 [facility] faxed refill request for the following medications:   amLODipine  (NORVASC ) 10 MG tablet   finasteride  (PROSCAR ) 5 MG tablet   atenolol  (TENORMIN ) 50 MG tablet   Please advise

## 2023-11-25 ENCOUNTER — Telehealth: Payer: Self-pay | Admitting: Family Medicine

## 2023-11-25 ENCOUNTER — Other Ambulatory Visit: Payer: Self-pay

## 2023-11-25 DIAGNOSIS — N401 Enlarged prostate with lower urinary tract symptoms: Secondary | ICD-10-CM

## 2023-11-25 DIAGNOSIS — I10 Essential (primary) hypertension: Secondary | ICD-10-CM

## 2023-11-25 NOTE — Telephone Encounter (Signed)
Converted to refill request 

## 2023-11-25 NOTE — Telephone Encounter (Signed)
 [facility] pharmacy faxed refill request for the following medications:   atenolol  (TENORMIN ) 50 MG tablet    finasteride  (PROSCAR ) 5 MG tablet    amLODipine  (NORVASC ) 10 MG tablet   Please advise

## 2023-11-29 ENCOUNTER — Other Ambulatory Visit: Payer: Self-pay

## 2023-11-29 DIAGNOSIS — I10 Essential (primary) hypertension: Secondary | ICD-10-CM

## 2023-11-29 DIAGNOSIS — N401 Enlarged prostate with lower urinary tract symptoms: Secondary | ICD-10-CM

## 2023-11-29 MED ORDER — ATENOLOL 50 MG PO TABS
50.0000 mg | ORAL_TABLET | Freq: Every day | ORAL | 2 refills | Status: AC
Start: 2023-11-29 — End: ?

## 2023-11-29 MED ORDER — FINASTERIDE 5 MG PO TABS
5.0000 mg | ORAL_TABLET | Freq: Every day | ORAL | 2 refills | Status: AC
Start: 2023-11-29 — End: ?

## 2023-11-29 MED ORDER — AMLODIPINE BESYLATE 10 MG PO TABS: ORAL_TABLET | ORAL | 2 refills | Status: AC

## 2023-11-29 NOTE — Telephone Encounter (Signed)
 Have sent to appropriate pharmacy

## 2023-11-29 NOTE — Telephone Encounter (Signed)
 Received another refill request from [facility] for these three medications.  Looks like they were sent to [facility] instead of [facility].  Please send to correct pharmacy.

## 2023-12-13 DIAGNOSIS — N1831 Chronic kidney disease, stage 3a: Secondary | ICD-10-CM | POA: Diagnosis not present

## 2023-12-13 DIAGNOSIS — I10 Essential (primary) hypertension: Secondary | ICD-10-CM | POA: Diagnosis not present

## 2023-12-13 DIAGNOSIS — N182 Chronic kidney disease, stage 2 (mild): Secondary | ICD-10-CM | POA: Diagnosis not present

## 2023-12-13 DIAGNOSIS — N2581 Secondary hyperparathyroidism of renal origin: Secondary | ICD-10-CM | POA: Diagnosis not present

## 2023-12-13 DIAGNOSIS — D631 Anemia in chronic kidney disease: Secondary | ICD-10-CM | POA: Diagnosis not present

## 2024-03-08 ENCOUNTER — Other Ambulatory Visit: Payer: Self-pay | Admitting: Family Medicine

## 2024-03-08 DIAGNOSIS — E039 Hypothyroidism, unspecified: Secondary | ICD-10-CM

## 2024-03-10 ENCOUNTER — Other Ambulatory Visit: Payer: Self-pay | Admitting: Family Medicine

## 2024-03-10 DIAGNOSIS — G4701 Insomnia due to medical condition: Secondary | ICD-10-CM

## 2024-03-11 NOTE — Telephone Encounter (Signed)
 Requested medication (s) are due for refill today: yes   Requested medication (s) are on the active medication list: yes   Last refill:  09/2023 #10  Future visit scheduled: yes   Notes to clinic:  Please review for refill.Refill not delegated per protocol.    Requested Prescriptions  Pending Prescriptions Disp Refills   temazepam  (RESTORIL ) 15 MG capsule [Pharmacy Med Name: TEMAZEPAM  15MG  CAPSULES] 10 capsule     Sig: TAKE 1 CAPSULE(15 MG) BY MOUTH AT BEDTIME AS NEEDED FOR SLEEP     Not Delegated - Psychiatry: Anxiolytics/Hypnotics 2 Failed - 03/11/2024 11:53 AM      Failed - This refill cannot be delegated      Failed - Urine Drug Screen completed in last 360 days      Failed - Valid encounter within last 6 months    Recent Outpatient Visits           6 months ago Stage 3a chronic kidney disease (HCC)   [place] [facility] Sharma Coyer, MD              Passed - Patient is not pregnant

## 2024-03-23 ENCOUNTER — Other Ambulatory Visit: Payer: Self-pay | Admitting: Family Medicine

## 2024-03-23 DIAGNOSIS — E039 Hypothyroidism, unspecified: Secondary | ICD-10-CM

## 2024-05-10 ENCOUNTER — Telehealth: Payer: Self-pay | Admitting: Family Medicine

## 2024-05-10 DIAGNOSIS — G4701 Insomnia due to medical condition: Secondary | ICD-10-CM

## 2024-05-10 MED ORDER — TEMAZEPAM 15 MG PO CAPS: ORAL_CAPSULE | ORAL | 3 refills | Status: AC

## 2024-05-10 NOTE — Telephone Encounter (Signed)
 Refill quantity updated. Please confirm pt has been scheduled for follow up in may 2026 as recommended during last OV

## 2024-05-10 NOTE — Telephone Encounter (Signed)
 Copied from CRM #[id number]. Topic: Clinical - Medication Question >> May 10, 2024  1:20 PM Deaijah H wrote: Reason for CRM: Patient Graeme niece would like to know if he could get script temazepam  (RESTORIL ) 15 MG capsule for 30 day and explanation why it's 10 days at a time. Is  out of medication and needs a refill but would like to know about 30 days

## 2024-09-12 ENCOUNTER — Ambulatory Visit: Admitting: Family Medicine
